# Patient Record
Sex: Female | Born: 1940
Health system: Southern US, Community
[De-identification: ages and names within clinical notes are randomized; demographics above are authoritative.]

## PROBLEM LIST (undated history)

## (undated) DIAGNOSIS — E785 Hyperlipidemia, unspecified: Secondary | ICD-10-CM

## (undated) DIAGNOSIS — F419 Anxiety disorder, unspecified: Secondary | ICD-10-CM

## (undated) DIAGNOSIS — F32A Depression, unspecified: Secondary | ICD-10-CM

## (undated) DIAGNOSIS — M549 Dorsalgia, unspecified: Secondary | ICD-10-CM

## (undated) DIAGNOSIS — E7800 Pure hypercholesterolemia, unspecified: Secondary | ICD-10-CM

## (undated) DIAGNOSIS — K635 Polyp of colon: Secondary | ICD-10-CM

## (undated) DIAGNOSIS — Z87440 Personal history of urinary (tract) infections: Secondary | ICD-10-CM

## (undated) DIAGNOSIS — M79605 Pain in left leg: Secondary | ICD-10-CM

## (undated) DIAGNOSIS — R739 Hyperglycemia, unspecified: Secondary | ICD-10-CM

## (undated) DIAGNOSIS — K219 Gastro-esophageal reflux disease without esophagitis: Secondary | ICD-10-CM

## (undated) DIAGNOSIS — M1990 Unspecified osteoarthritis, unspecified site: Secondary | ICD-10-CM

## (undated) DIAGNOSIS — F329 Major depressive disorder, single episode, unspecified: Secondary | ICD-10-CM

## (undated) DIAGNOSIS — I10 Essential (primary) hypertension: Secondary | ICD-10-CM

## (undated) DIAGNOSIS — G8929 Other chronic pain: Secondary | ICD-10-CM

## (undated) DIAGNOSIS — G500 Trigeminal neuralgia: Secondary | ICD-10-CM

## (undated) DIAGNOSIS — R635 Abnormal weight gain: Secondary | ICD-10-CM

## (undated) DIAGNOSIS — E871 Hypo-osmolality and hyponatremia: Secondary | ICD-10-CM

## (undated) HISTORY — PX: ABDOMINAL EXPLORATION SURGERY: SHX538

## (undated) HISTORY — DX: Hyperlipidemia, unspecified: E78.5

## (undated) HISTORY — DX: Trigeminal neuralgia: G50.0

## (undated) HISTORY — DX: Depression, unspecified: F32.A

## (undated) HISTORY — PX: APPENDECTOMY: SHX54

## (undated) HISTORY — DX: Hyperglycemia, unspecified: R73.9

## (undated) HISTORY — DX: Pain in left leg: M79.605

## (undated) HISTORY — DX: Hypo-osmolality and hyponatremia: E87.1

## (undated) HISTORY — DX: Anxiety disorder, unspecified: F41.9

## (undated) HISTORY — PX: TONSILLECTOMY: SUR1361

## (undated) HISTORY — PX: COLONOSCOPY: SHX174

## (undated) HISTORY — DX: Polyp of colon: K63.5

## (undated) HISTORY — DX: Abnormal weight gain: R63.5

---
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
## 1998-12-15 ENCOUNTER — Emergency Department (HOSPITAL_COMMUNITY): Admission: EM | Admit: 1998-12-15 | Discharge: 1998-12-15 | Payer: Self-pay | Admitting: Emergency Medicine

## 2000-10-13 ENCOUNTER — Other Ambulatory Visit: Admission: RE | Admit: 2000-10-13 | Discharge: 2000-10-13 | Payer: Self-pay | Admitting: Obstetrics and Gynecology

## 2001-10-26 ENCOUNTER — Other Ambulatory Visit: Admission: RE | Admit: 2001-10-26 | Discharge: 2001-10-26 | Payer: Self-pay | Admitting: Internal Medicine

## 2011-02-21 ENCOUNTER — Other Ambulatory Visit: Payer: Self-pay | Admitting: Neurology

## 2011-02-21 DIAGNOSIS — G501 Atypical facial pain: Secondary | ICD-10-CM

## 2011-02-28 ENCOUNTER — Ambulatory Visit
Admission: RE | Admit: 2011-02-28 | Discharge: 2011-02-28 | Disposition: A | Discharge status: Home / Self Care | Payer: Medicare Other | Source: Ambulatory Visit | Attending: Neurology | Admitting: Neurology

## 2011-02-28 DIAGNOSIS — G501 Atypical facial pain: Secondary | ICD-10-CM

## 2011-02-28 MED ORDER — GADOBENATE DIMEGLUMINE 529 MG/ML IV SOLN
15.0000 mL | Freq: Once | INTRAVENOUS | Status: AC | PRN
  Administered 2011-02-28: 15 mL via INTRAVENOUS

## 2011-11-20 DIAGNOSIS — M545 Low back pain: Secondary | ICD-10-CM | POA: Diagnosis not present

## 2011-11-20 DIAGNOSIS — R030 Elevated blood-pressure reading, without diagnosis of hypertension: Secondary | ICD-10-CM | POA: Diagnosis not present

## 2011-11-21 DIAGNOSIS — M5126 Other intervertebral disc displacement, lumbar region: Secondary | ICD-10-CM | POA: Diagnosis not present

## 2012-01-13 DIAGNOSIS — Z0000 Encounter for general adult medical examination without abnormal findings: Secondary | ICD-10-CM | POA: Diagnosis not present

## 2012-01-13 DIAGNOSIS — Z124 Encounter for screening for malignant neoplasm of cervix: Secondary | ICD-10-CM | POA: Diagnosis not present

## 2012-01-13 DIAGNOSIS — Z01419 Encounter for gynecological examination (general) (routine) without abnormal findings: Secondary | ICD-10-CM | POA: Diagnosis not present

## 2012-04-08 DIAGNOSIS — I10 Essential (primary) hypertension: Secondary | ICD-10-CM | POA: Diagnosis not present

## 2012-04-08 DIAGNOSIS — Z79899 Other long term (current) drug therapy: Secondary | ICD-10-CM | POA: Diagnosis not present

## 2012-04-08 DIAGNOSIS — M545 Low back pain: Secondary | ICD-10-CM | POA: Diagnosis not present

## 2012-04-08 DIAGNOSIS — G500 Trigeminal neuralgia: Secondary | ICD-10-CM | POA: Diagnosis not present

## 2012-05-17 ENCOUNTER — Other Ambulatory Visit: Payer: Self-pay | Admitting: Dermatology

## 2012-05-17 DIAGNOSIS — K648 Other hemorrhoids: Secondary | ICD-10-CM | POA: Diagnosis not present

## 2012-05-17 DIAGNOSIS — D126 Benign neoplasm of colon, unspecified: Secondary | ICD-10-CM | POA: Diagnosis not present

## 2012-05-17 DIAGNOSIS — Z86010 Personal history of colonic polyps: Secondary | ICD-10-CM | POA: Diagnosis not present

## 2012-05-17 DIAGNOSIS — M545 Low back pain: Secondary | ICD-10-CM

## 2012-05-18 ENCOUNTER — Ambulatory Visit
Admission: RE | Admit: 2012-05-18 | Discharge: 2012-05-18 | Disposition: A | Discharge status: Home / Self Care | Payer: Medicare Other | Source: Ambulatory Visit | Attending: Dermatology | Admitting: Dermatology

## 2012-05-18 VITALS — BP 166/76 | HR 74 | Ht 67.0 in | Wt 150.0 lb

## 2012-05-18 DIAGNOSIS — M4310 Spondylolisthesis, site unspecified: Secondary | ICD-10-CM | POA: Diagnosis not present

## 2012-05-18 DIAGNOSIS — M545 Low back pain: Secondary | ICD-10-CM

## 2012-05-18 DIAGNOSIS — M47817 Spondylosis without myelopathy or radiculopathy, lumbosacral region: Secondary | ICD-10-CM | POA: Diagnosis not present

## 2012-05-18 MED ORDER — IOHEXOL 180 MG/ML  SOLN
1.0000 mL | Freq: Once | INTRAMUSCULAR | Status: AC | PRN
  Administered 2012-05-18: 1 mL via EPIDURAL

## 2012-05-18 MED ORDER — METHYLPREDNISOLONE ACETATE 40 MG/ML INJ SUSP (RADIOLOG
120.0000 mg | Freq: Once | INTRAMUSCULAR | Status: AC
  Administered 2012-05-18: 120 mg via EPIDURAL

## 2012-05-27 ENCOUNTER — Other Ambulatory Visit: Payer: Self-pay | Admitting: Internal Medicine

## 2012-05-27 DIAGNOSIS — M545 Low back pain: Secondary | ICD-10-CM

## 2012-06-02 DIAGNOSIS — K648 Other hemorrhoids: Secondary | ICD-10-CM | POA: Diagnosis not present

## 2012-06-15 ENCOUNTER — Ambulatory Visit
Admission: RE | Admit: 2012-06-15 | Discharge: 2012-06-15 | Disposition: A | Discharge status: Home / Self Care | Payer: Medicare Other | Source: Ambulatory Visit | Attending: Internal Medicine | Admitting: Internal Medicine

## 2012-06-15 VITALS — BP 159/66 | HR 77

## 2012-06-15 DIAGNOSIS — M545 Low back pain: Secondary | ICD-10-CM

## 2012-06-15 DIAGNOSIS — M47817 Spondylosis without myelopathy or radiculopathy, lumbosacral region: Secondary | ICD-10-CM | POA: Diagnosis not present

## 2012-06-15 DIAGNOSIS — M5126 Other intervertebral disc displacement, lumbar region: Secondary | ICD-10-CM

## 2012-06-15 MED ORDER — METHYLPREDNISOLONE ACETATE 40 MG/ML INJ SUSP (RADIOLOG
120.0000 mg | Freq: Once | INTRAMUSCULAR | Status: AC
  Administered 2012-06-15: 120 mg via EPIDURAL

## 2012-06-15 MED ORDER — IOHEXOL 180 MG/ML  SOLN
1.0000 mL | Freq: Once | INTRAMUSCULAR | Status: AC | PRN
  Administered 2012-06-15: 1 mL via EPIDURAL

## 2012-06-16 DIAGNOSIS — K648 Other hemorrhoids: Secondary | ICD-10-CM | POA: Diagnosis not present

## 2012-06-30 DIAGNOSIS — K648 Other hemorrhoids: Secondary | ICD-10-CM | POA: Diagnosis not present

## 2012-08-12 DIAGNOSIS — Z23 Encounter for immunization: Secondary | ICD-10-CM | POA: Diagnosis not present

## 2012-10-04 DIAGNOSIS — E785 Hyperlipidemia, unspecified: Secondary | ICD-10-CM | POA: Diagnosis not present

## 2012-10-04 DIAGNOSIS — I10 Essential (primary) hypertension: Secondary | ICD-10-CM | POA: Diagnosis not present

## 2012-10-11 DIAGNOSIS — Z79899 Other long term (current) drug therapy: Secondary | ICD-10-CM | POA: Diagnosis not present

## 2012-10-11 DIAGNOSIS — Z0000 Encounter for general adult medical examination without abnormal findings: Secondary | ICD-10-CM | POA: Diagnosis not present

## 2012-10-11 DIAGNOSIS — I10 Essential (primary) hypertension: Secondary | ICD-10-CM | POA: Diagnosis not present

## 2012-10-11 DIAGNOSIS — E785 Hyperlipidemia, unspecified: Secondary | ICD-10-CM | POA: Diagnosis not present

## 2012-10-18 ENCOUNTER — Ambulatory Visit
Admission: RE | Admit: 2012-10-18 | Discharge: 2012-10-18 | Disposition: A | Discharge status: Home / Self Care | Payer: Medicare Other | Source: Ambulatory Visit | Attending: Internal Medicine | Admitting: Internal Medicine

## 2012-10-18 ENCOUNTER — Other Ambulatory Visit: Payer: Self-pay | Admitting: Internal Medicine

## 2012-10-18 DIAGNOSIS — IMO0002 Reserved for concepts with insufficient information to code with codable children: Secondary | ICD-10-CM | POA: Diagnosis not present

## 2012-10-18 DIAGNOSIS — M545 Low back pain: Secondary | ICD-10-CM

## 2012-10-18 DIAGNOSIS — M5126 Other intervertebral disc displacement, lumbar region: Secondary | ICD-10-CM | POA: Diagnosis not present

## 2012-10-18 MED ORDER — IOHEXOL 180 MG/ML  SOLN
1.0000 mL | Freq: Once | INTRAMUSCULAR | Status: AC | PRN
  Administered 2012-10-18: 1 mL via EPIDURAL

## 2012-10-18 MED ORDER — METHYLPREDNISOLONE ACETATE 40 MG/ML INJ SUSP (RADIOLOG
120.0000 mg | Freq: Once | INTRAMUSCULAR | Status: AC
  Administered 2012-10-18: 120 mg via EPIDURAL

## 2013-01-12 ENCOUNTER — Other Ambulatory Visit: Payer: Self-pay | Admitting: Internal Medicine

## 2013-01-13 ENCOUNTER — Ambulatory Visit
Admission: RE | Admit: 2013-01-13 | Discharge: 2013-01-13 | Disposition: A | Discharge status: Home / Self Care | Payer: Medicare Other | Source: Ambulatory Visit | Attending: Internal Medicine | Admitting: Internal Medicine

## 2013-01-13 VITALS — BP 150/75 | HR 81

## 2013-01-13 DIAGNOSIS — M4310 Spondylolisthesis, site unspecified: Secondary | ICD-10-CM | POA: Diagnosis not present

## 2013-01-13 MED ORDER — METHYLPREDNISOLONE ACETATE 40 MG/ML INJ SUSP (RADIOLOG
120.0000 mg | Freq: Once | INTRAMUSCULAR | Status: AC
  Administered 2013-01-13: 120 mg via EPIDURAL

## 2013-01-13 MED ORDER — IOHEXOL 180 MG/ML  SOLN
1.0000 mL | Freq: Once | INTRAMUSCULAR | Status: AC | PRN
  Administered 2013-01-13: 1 mL via EPIDURAL

## 2013-03-18 ENCOUNTER — Other Ambulatory Visit: Payer: Self-pay | Admitting: Internal Medicine

## 2013-03-18 DIAGNOSIS — M549 Dorsalgia, unspecified: Secondary | ICD-10-CM

## 2013-03-21 ENCOUNTER — Ambulatory Visit
Admission: RE | Admit: 2013-03-21 | Discharge: 2013-03-21 | Disposition: A | Discharge status: Home / Self Care | Payer: Medicare Other | Source: Ambulatory Visit | Attending: Internal Medicine | Admitting: Internal Medicine

## 2013-03-21 ENCOUNTER — Other Ambulatory Visit: Payer: Self-pay | Admitting: Internal Medicine

## 2013-03-21 VITALS — BP 151/70 | HR 75

## 2013-03-21 DIAGNOSIS — M549 Dorsalgia, unspecified: Secondary | ICD-10-CM

## 2013-03-21 DIAGNOSIS — M47817 Spondylosis without myelopathy or radiculopathy, lumbosacral region: Secondary | ICD-10-CM | POA: Diagnosis not present

## 2013-03-21 MED ORDER — IOHEXOL 180 MG/ML  SOLN
1.0000 mL | Freq: Once | INTRAMUSCULAR | Status: AC | PRN
  Administered 2013-03-21: 1 mL via EPIDURAL

## 2013-03-21 MED ORDER — METHYLPREDNISOLONE ACETATE 40 MG/ML INJ SUSP (RADIOLOG
120.0000 mg | Freq: Once | INTRAMUSCULAR | Status: AC
  Administered 2013-03-21: 120 mg via EPIDURAL

## 2013-03-21 NOTE — Progress Notes (Signed)
Cold pack to injection sites  

## 2013-08-17 ENCOUNTER — Other Ambulatory Visit: Payer: Self-pay | Admitting: Internal Medicine

## 2013-08-17 ENCOUNTER — Other Ambulatory Visit: Payer: Self-pay | Admitting: *Deleted

## 2013-08-17 DIAGNOSIS — M545 Low back pain: Secondary | ICD-10-CM

## 2013-08-22 ENCOUNTER — Other Ambulatory Visit: Payer: Self-pay | Admitting: Internal Medicine

## 2013-08-22 ENCOUNTER — Ambulatory Visit
Admission: RE | Admit: 2013-08-22 | Discharge: 2013-08-22 | Disposition: A | Discharge status: Home / Self Care | Payer: Medicare Other | Source: Ambulatory Visit | Attending: Internal Medicine | Admitting: Internal Medicine

## 2013-08-22 VITALS — BP 167/85 | HR 81

## 2013-08-22 DIAGNOSIS — M545 Low back pain: Secondary | ICD-10-CM

## 2013-08-22 DIAGNOSIS — M47817 Spondylosis without myelopathy or radiculopathy, lumbosacral region: Secondary | ICD-10-CM | POA: Diagnosis not present

## 2013-08-22 MED ORDER — IOHEXOL 180 MG/ML  SOLN
1.0000 mL | Freq: Once | INTRAMUSCULAR | Status: AC | PRN
  Administered 2013-08-22: 1 mL via EPIDURAL

## 2013-08-22 MED ORDER — METHYLPREDNISOLONE ACETATE 40 MG/ML INJ SUSP (RADIOLOG
120.0000 mg | Freq: Once | INTRAMUSCULAR | Status: AC
  Administered 2013-08-22: 120 mg via EPIDURAL

## 2013-09-12 DIAGNOSIS — Z23 Encounter for immunization: Secondary | ICD-10-CM | POA: Diagnosis not present

## 2013-10-13 DIAGNOSIS — R7309 Other abnormal glucose: Secondary | ICD-10-CM | POA: Diagnosis not present

## 2013-10-13 DIAGNOSIS — E785 Hyperlipidemia, unspecified: Secondary | ICD-10-CM | POA: Diagnosis not present

## 2013-10-13 DIAGNOSIS — I10 Essential (primary) hypertension: Secondary | ICD-10-CM | POA: Diagnosis not present

## 2013-10-20 DIAGNOSIS — I10 Essential (primary) hypertension: Secondary | ICD-10-CM | POA: Diagnosis not present

## 2013-10-20 DIAGNOSIS — F341 Dysthymic disorder: Secondary | ICD-10-CM | POA: Diagnosis not present

## 2013-10-20 DIAGNOSIS — E785 Hyperlipidemia, unspecified: Secondary | ICD-10-CM | POA: Diagnosis not present

## 2013-10-20 DIAGNOSIS — R7309 Other abnormal glucose: Secondary | ICD-10-CM | POA: Diagnosis not present

## 2013-10-20 DIAGNOSIS — Z0000 Encounter for general adult medical examination without abnormal findings: Secondary | ICD-10-CM | POA: Diagnosis not present

## 2013-10-20 DIAGNOSIS — Z1331 Encounter for screening for depression: Secondary | ICD-10-CM | POA: Diagnosis not present

## 2013-10-20 DIAGNOSIS — M545 Low back pain: Secondary | ICD-10-CM | POA: Diagnosis not present

## 2013-10-20 DIAGNOSIS — R0789 Other chest pain: Secondary | ICD-10-CM | POA: Diagnosis not present

## 2013-10-20 DIAGNOSIS — Z79899 Other long term (current) drug therapy: Secondary | ICD-10-CM | POA: Diagnosis not present

## 2013-10-21 ENCOUNTER — Other Ambulatory Visit: Payer: Self-pay | Admitting: Internal Medicine

## 2013-10-21 DIAGNOSIS — M549 Dorsalgia, unspecified: Secondary | ICD-10-CM

## 2013-10-24 ENCOUNTER — Other Ambulatory Visit: Payer: Self-pay | Admitting: Internal Medicine

## 2013-10-24 ENCOUNTER — Ambulatory Visit
Admission: RE | Admit: 2013-10-24 | Discharge: 2013-10-24 | Disposition: A | Discharge status: Home / Self Care | Payer: BC Managed Care – PPO | Source: Ambulatory Visit | Attending: Internal Medicine | Admitting: Internal Medicine

## 2013-10-24 DIAGNOSIS — M549 Dorsalgia, unspecified: Secondary | ICD-10-CM

## 2013-10-24 DIAGNOSIS — M47817 Spondylosis without myelopathy or radiculopathy, lumbosacral region: Secondary | ICD-10-CM | POA: Diagnosis not present

## 2013-10-24 MED ORDER — IOHEXOL 180 MG/ML  SOLN: 1.0000 mL | Freq: Once | INTRAMUSCULAR | Status: AC | PRN

## 2013-10-24 MED ORDER — METHYLPREDNISOLONE ACETATE 40 MG/ML INJ SUSP (RADIOLOG
120.0000 mg | Freq: Once | INTRAMUSCULAR | Status: AC
  Administered 2013-10-24: 120 mg via INTRA_ARTICULAR

## 2013-10-24 MED ORDER — METHYLPREDNISOLONE ACETATE 40 MG/ML INJ SUSP (RADIOLOG: 120.0000 mg | Freq: Once | INTRAMUSCULAR | Status: DC

## 2013-10-24 MED ORDER — IOHEXOL 180 MG/ML  SOLN
1.0000 mL | Freq: Once | INTRAMUSCULAR | Status: AC | PRN
  Administered 2013-10-24: 1 mL via INTRA_ARTICULAR

## 2013-11-21 DIAGNOSIS — M549 Dorsalgia, unspecified: Secondary | ICD-10-CM | POA: Diagnosis not present

## 2013-11-21 DIAGNOSIS — E669 Obesity, unspecified: Secondary | ICD-10-CM | POA: Diagnosis not present

## 2013-11-22 ENCOUNTER — Other Ambulatory Visit: Payer: Self-pay | Admitting: Neurological Surgery

## 2013-11-22 DIAGNOSIS — M549 Dorsalgia, unspecified: Secondary | ICD-10-CM

## 2013-11-24 ENCOUNTER — Ambulatory Visit
Admission: RE | Admit: 2013-11-24 | Discharge: 2013-11-24 | Disposition: A | Discharge status: Home / Self Care | Payer: Medicare Other | Source: Ambulatory Visit | Attending: Neurological Surgery | Admitting: Neurological Surgery

## 2013-11-24 DIAGNOSIS — M4310 Spondylolisthesis, site unspecified: Secondary | ICD-10-CM | POA: Diagnosis not present

## 2013-11-24 DIAGNOSIS — M5137 Other intervertebral disc degeneration, lumbosacral region: Secondary | ICD-10-CM | POA: Diagnosis not present

## 2013-11-24 DIAGNOSIS — M5126 Other intervertebral disc displacement, lumbar region: Secondary | ICD-10-CM | POA: Diagnosis not present

## 2013-11-24 DIAGNOSIS — M549 Dorsalgia, unspecified: Secondary | ICD-10-CM

## 2013-11-24 DIAGNOSIS — M48061 Spinal stenosis, lumbar region without neurogenic claudication: Secondary | ICD-10-CM | POA: Diagnosis not present

## 2013-12-12 ENCOUNTER — Other Ambulatory Visit: Payer: Self-pay | Admitting: Neurological Surgery

## 2013-12-21 ENCOUNTER — Encounter (HOSPITAL_COMMUNITY): Payer: Self-pay | Admitting: Pharmacy Technician

## 2013-12-22 ENCOUNTER — Other Ambulatory Visit (HOSPITAL_COMMUNITY): Payer: Self-pay | Admitting: *Deleted

## 2013-12-22 ENCOUNTER — Encounter (HOSPITAL_COMMUNITY)
Admission: RE | Admit: 2013-12-22 | Discharge: 2013-12-22 | Disposition: A | Discharge status: Home / Self Care | Payer: Medicare Other | Source: Ambulatory Visit | Attending: Neurological Surgery | Admitting: Neurological Surgery

## 2013-12-22 ENCOUNTER — Encounter (HOSPITAL_COMMUNITY): Payer: Self-pay

## 2013-12-22 DIAGNOSIS — Z01810 Encounter for preprocedural cardiovascular examination: Secondary | ICD-10-CM | POA: Insufficient documentation

## 2013-12-22 DIAGNOSIS — Z01812 Encounter for preprocedural laboratory examination: Secondary | ICD-10-CM | POA: Insufficient documentation

## 2013-12-22 DIAGNOSIS — Z01818 Encounter for other preprocedural examination: Secondary | ICD-10-CM | POA: Diagnosis not present

## 2013-12-22 HISTORY — DX: Gastro-esophageal reflux disease without esophagitis: K21.9

## 2013-12-22 HISTORY — DX: Dorsalgia, unspecified: M54.9

## 2013-12-22 HISTORY — DX: Essential (primary) hypertension: I10

## 2013-12-22 HISTORY — DX: Pure hypercholesterolemia, unspecified: E78.00

## 2013-12-22 HISTORY — DX: Personal history of urinary (tract) infections: Z87.440

## 2013-12-22 HISTORY — DX: Other chronic pain: G89.29

## 2013-12-22 HISTORY — DX: Unspecified osteoarthritis, unspecified site: M19.90

## 2013-12-22 LAB — CBC WITH DIFFERENTIAL/PLATELET
BASOS ABS: 0.1 10*3/uL (ref 0.0–0.1)
BASOS PCT: 1 % (ref 0–1)
EOS ABS: 0.1 10*3/uL (ref 0.0–0.7)
EOS PCT: 1 % (ref 0–5)
HCT: 37.3 % (ref 36.0–46.0)
Hemoglobin: 12.7 g/dL (ref 12.0–15.0)
Lymphocytes Relative: 26 % (ref 12–46)
Lymphs Abs: 1.8 10*3/uL (ref 0.7–4.0)
MCH: 34.6 pg — AB (ref 26.0–34.0)
MCHC: 34 g/dL (ref 30.0–36.0)
MCV: 101.6 fL — ABNORMAL HIGH (ref 78.0–100.0)
Monocytes Absolute: 0.6 10*3/uL (ref 0.1–1.0)
Monocytes Relative: 8 % (ref 3–12)
Neutro Abs: 4.5 10*3/uL (ref 1.7–7.7)
Neutrophils Relative %: 64 % (ref 43–77)
PLATELETS: 226 10*3/uL (ref 150–400)
RBC: 3.67 MIL/uL — AB (ref 3.87–5.11)
RDW: 13.9 % (ref 11.5–15.5)
WBC: 7.1 10*3/uL (ref 4.0–10.5)

## 2013-12-22 LAB — BASIC METABOLIC PANEL
BUN: 15 mg/dL (ref 6–23)
CALCIUM: 8.8 mg/dL (ref 8.4–10.5)
CO2: 24 mEq/L (ref 19–32)
Chloride: 102 mEq/L (ref 96–112)
Creatinine, Ser: 0.73 mg/dL (ref 0.50–1.10)
GFR calc Af Amer: >90 mL/min (ref >90)
GFR calc non Af Amer: 84 mL/min — ABNORMAL LOW (ref >90)
GLUCOSE: 107 mg/dL — AB (ref 70–99)
Potassium: 4.3 mEq/L (ref 3.7–5.3)
Sodium: 141 mEq/L (ref 137–147)

## 2013-12-22 LAB — TYPE AND SCREEN
ABO/RH(D): A POS
Antibody Screen: NEGATIVE

## 2013-12-22 LAB — PROTIME-INR
INR: 1.05 (ref 0.00–1.49)
PROTHROMBIN TIME: 13.5 s (ref 11.6–15.2)

## 2013-12-22 LAB — ABO/RH: ABO/RH(D): A POS

## 2013-12-22 LAB — SURGICAL PCR SCREEN
MRSA, PCR: NEGATIVE
Staphylococcus aureus: NEGATIVE

## 2013-12-22 NOTE — Pre-Procedure Instructions (Signed)
Janet Boyer  12/22/2013   Your procedure is scheduled on:  Friday, December 30, 2013 at 7:30 AM.   Report to [facility] Entrance "A" Admitting Office at 5:30 AM.   Call this number if you have problems the morning of surgery: [phone]   Remember:   Do not eat food or drink liquids after midnight Thursday, 12/29/13.   Take these medicines the morning of surgery with A SIP OF WATER: diltiazem (MATZIM LA),  LORazepam (ATIVAN), omeprazole (PRILOSEC), SALINE nasal spray - if needed.    Do not wear jewelry, make-up or nail polish.  Do not wear lotions, powders, or perfumes. You may wear deodorant.  Do not shave 48 hours prior to surgery.   Do not bring valuables to the hospital.  [facility] is not responsible                  for any belongings or valuables.               Contacts, dentures or bridgework may not be worn into surgery.  Leave suitcase in the car. After surgery it may be brought to your room.  For patients admitted to the hospital, discharge time is determined by your                treatment team.              Special Instructions: [place] - Preparing for Surgery  Before surgery, you can play an important role.  Because skin is not sterile, your skin needs to be as free of germs as possible.  You can reduce the number of germs on you skin by washing with CHG (chlorahexidine gluconate) soap before surgery.  CHG is an antiseptic cleaner which kills germs and bonds with the skin to continue killing germs even after washing.  Please DO NOT use if you have an allergy to CHG or antibacterial soaps.  If your skin becomes reddened/irritated stop using the CHG and inform your nurse when you arrive at Short Stay.  Do not shave (including legs and underarms) for at least 48 hours prior to the first CHG shower.  You may shave your face.  Please follow these instructions carefully:   1.  Shower with CHG Soap the night before surgery and the                                 morning of Surgery.  2.  If you choose to wash your hair, wash your hair first as usual with your       normal shampoo.  3.  After you shampoo, rinse your hair and body thoroughly to remove the                      Shampoo.  4.  Use CHG as you would any other liquid soap.  You can apply chg directly       to the skin and wash gently with scrungie or a clean washcloth.  5.  Apply the CHG Soap to your body ONLY FROM THE NECK DOWN.        Do not use on open wounds or open sores.  Avoid contact with your eyes, ears, mouth and genitals (private parts).  Wash genitals (private parts) with your normal soap.  6.  Wash thoroughly, paying special attention to the area where your surgery  will be performed.  7.  Thoroughly rinse your body with warm water from the neck down.  8.  DO NOT shower/wash with your normal soap after using and rinsing off       the CHG Soap.  9.  Pat yourself dry with a clean towel.            10.  Wear clean pajamas.            11.  Place clean sheets on your bed the night of your first shower and do not        sleep with pets.  Day of Surgery  Do not apply any lotions/deoderants the morning of surgery.  Please wear clean clothes to the hospital/surgery center.     Please read over the following fact sheets that you were given: Pain Booklet, Coughing and Deep Breathing, Blood Transfusion Information, MRSA Information and Surgical Site Infection Prevention

## 2013-12-22 NOTE — Pre-Procedure Instructions (Signed)
Janet Boyer  12/22/2013   Your procedure is scheduled on:  Friday, December 30, 2013 at 7:30 AM.   Report to [facility] Entrance "A" Admitting Office at 5:30 AM.   Call this number if you have problems the morning of surgery: [phone]   Remember:   Do not eat food or drink liquids after midnight Thursday, 12/29/13.   Take these medicines the morning of surgery with A SIP OF WATER: diltiazem (MATZIM LA),  LORazepam (ATIVAN), omeprazole (PRILOSEC), SALINE nasal spray - if needed.    Do not wear jewelry, make-up or nail polish.  Do not wear lotions, powders, or perfumes. You may wear deodorant.  Do not shave 48 hours prior to surgery.   Do not bring valuables to the hospital.  [facility] is not responsible                  for any belongings or valuables.               Contacts, dentures or bridgework may not be worn into surgery.  Leave suitcase in the car. After surgery it may be brought to your room.  For patients admitted to the hospital, discharge time is determined by your                treatment team.              Special Instructions: [place] - Preparing for Surgery  Before surgery, you can play an important role.  Because skin is not sterile, your skin needs to be as free of germs as possible.  You can reduce the number of germs on you skin by washing with CHG (chlorahexidine gluconate) soap before surgery.  CHG is an antiseptic cleaner which kills germs and bonds with the skin to continue killing germs even after washing.  Please DO NOT use if you have an allergy to CHG or antibacterial soaps.  If your skin becomes reddened/irritated stop using the CHG and inform your nurse when you arrive at Short Stay.  Do not shave (including legs and underarms) for at least 48 hours prior to the first CHG shower.  You may shave your face.  Please follow these instructions carefully:   1.  Shower with CHG Soap the night before surgery and the                                 morning of Surgery.  2.  If you choose to wash your hair, wash your hair first as usual with your       normal shampoo.  3.  After you shampoo, rinse your hair and body thoroughly to remove the                      Shampoo.  4.  Use CHG as you would any other liquid soap.  You can apply chg directly       to the skin and wash gently with scrungie or a clean washcloth.  5.  Apply the CHG Soap to your body ONLY FROM THE NECK DOWN.        Do not use on open wounds or open sores.  Avoid contact with your eyes, ears, mouth and genitals (private parts).  Wash genitals (private parts) with your normal soap.  6.  Wash thoroughly, paying special attention to the area where your surgery          thoroughly, paying special attention to the area where your surgery        will be performed.  7.  Thoroughly rinse your body with warm water from the neck down.  8.  DO NOT shower/wash with your normal soap after using and rinsing off       the CHG Soap.  9.  Pat yourself dry with a clean towel.            10.  Wear clean pajamas.            11.  Place clean sheets on your bed the night of your first shower and do not        sleep with pets.  Day of Surgery  Do not apply any lotions/deoderants the morning of surgery.  Please wear clean clothes to the hospital/surgery center.     Please read over the following fact sheets that you were given: Pain Booklet, Coughing and Deep Breathing, Blood Transfusion Information, MRSA Information and Surgical Site Infection Prevention

## 2013-12-26 NOTE — Progress Notes (Signed)
Anesthesia Chart Review:  Patient is a 73 year old female scheduled for L3-4 MAS, PLIF on 12/30/13 by Dr. Ronnald Ramp.  History includes non-smoker, HTN, GERD, hypercholesterolemia, arthritis, UTI, chronic back pain.  PCP is Dr. Leanna Battles.  His last office note and her EKG, CXR, and labs reviewed for her PAT visit. If no acute changes then I anticipate that she can proceed as planned.  George Hugh [facility] Short Stay Center/Anesthesiology Phone [phone] 12/26/2013 1:25 PM

## 2013-12-29 MED ORDER — CEFAZOLIN SODIUM-DEXTROSE 2-3 GM-% IV SOLR
2.0000 g | INTRAVENOUS | Status: AC
  Administered 2013-12-30: 2 g via INTRAVENOUS
  Filled 2013-12-29: qty 50

## 2013-12-30 ENCOUNTER — Encounter (HOSPITAL_COMMUNITY): Payer: Medicare Other | Admitting: Vascular Surgery

## 2013-12-30 ENCOUNTER — Inpatient Hospital Stay (HOSPITAL_COMMUNITY): Payer: Medicare Other

## 2013-12-30 ENCOUNTER — Encounter (HOSPITAL_COMMUNITY): Payer: Self-pay | Admitting: Certified Registered"

## 2013-12-30 ENCOUNTER — Encounter (HOSPITAL_COMMUNITY)
Admission: RE | Disposition: A | Discharge status: Home Health | Payer: Self-pay | Source: Ambulatory Visit | Attending: Neurological Surgery

## 2013-12-30 ENCOUNTER — Inpatient Hospital Stay (HOSPITAL_COMMUNITY): Payer: Medicare Other | Admitting: Certified Registered"

## 2013-12-30 ENCOUNTER — Inpatient Hospital Stay (HOSPITAL_COMMUNITY)
Admission: RE | Admit: 2013-12-30 | Discharge: 2014-01-01 | DRG: 460 | Disposition: A | Discharge status: Home Health | Payer: Medicare Other | Source: Ambulatory Visit | Attending: Neurological Surgery | Admitting: Neurological Surgery

## 2013-12-30 DIAGNOSIS — Z01818 Encounter for other preprocedural examination: Secondary | ICD-10-CM

## 2013-12-30 DIAGNOSIS — I10 Essential (primary) hypertension: Secondary | ICD-10-CM | POA: Diagnosis present

## 2013-12-30 DIAGNOSIS — M4310 Spondylolisthesis, site unspecified: Secondary | ICD-10-CM | POA: Diagnosis not present

## 2013-12-30 DIAGNOSIS — M48061 Spinal stenosis, lumbar region without neurogenic claudication: Principal | ICD-10-CM | POA: Diagnosis present

## 2013-12-30 DIAGNOSIS — M519 Unspecified thoracic, thoracolumbar and lumbosacral intervertebral disc disorder: Secondary | ICD-10-CM | POA: Diagnosis not present

## 2013-12-30 DIAGNOSIS — G8929 Other chronic pain: Secondary | ICD-10-CM | POA: Diagnosis present

## 2013-12-30 DIAGNOSIS — Z01812 Encounter for preprocedural laboratory examination: Secondary | ICD-10-CM | POA: Diagnosis not present

## 2013-12-30 DIAGNOSIS — Z981 Arthrodesis status: Secondary | ICD-10-CM

## 2013-12-30 DIAGNOSIS — Z01810 Encounter for preprocedural cardiovascular examination: Secondary | ICD-10-CM

## 2013-12-30 DIAGNOSIS — M129 Arthropathy, unspecified: Secondary | ICD-10-CM | POA: Diagnosis present

## 2013-12-30 DIAGNOSIS — Z79899 Other long term (current) drug therapy: Secondary | ICD-10-CM

## 2013-12-30 DIAGNOSIS — K219 Gastro-esophageal reflux disease without esophagitis: Secondary | ICD-10-CM | POA: Diagnosis present

## 2013-12-30 DIAGNOSIS — M48062 Spinal stenosis, lumbar region with neurogenic claudication: Secondary | ICD-10-CM | POA: Diagnosis not present

## 2013-12-30 DIAGNOSIS — Q762 Congenital spondylolisthesis: Secondary | ICD-10-CM

## 2013-12-30 DIAGNOSIS — E7800 Pure hypercholesterolemia, unspecified: Secondary | ICD-10-CM | POA: Diagnosis not present

## 2013-12-30 DIAGNOSIS — M539 Dorsopathy, unspecified: Secondary | ICD-10-CM | POA: Diagnosis not present

## 2013-12-30 HISTORY — PX: MAXIMUM ACCESS (MAS)POSTERIOR LUMBAR INTERBODY FUSION (PLIF) 1 LEVEL: SHX6368

## 2013-12-30 SURGERY — FOR MAXIMUM ACCESS (MAS) POSTERIOR LUMBAR INTERBODY FUSION (PLIF) 1 LEVEL
Anesthesia: General | Site: Back

## 2013-12-30 MED ORDER — MIDAZOLAM HCL 2 MG/2ML IJ SOLN
INTRAMUSCULAR | Status: AC
  Filled 2013-12-30: qty 2

## 2013-12-30 MED ORDER — DILTIAZEM HCL ER COATED BEADS 180 MG PO TB24
180.0000 mg | ORAL_TABLET | Freq: Every day | ORAL | Status: DC
  Administered 2013-12-31 – 2014-01-01 (×2): 180 mg via ORAL
  Filled 2013-12-30 (×2): qty 1

## 2013-12-30 MED ORDER — TRIAMTERENE-HCTZ 37.5-25 MG PO TABS
0.5000 | ORAL_TABLET | Freq: Every day | ORAL | Status: DC
  Administered 2013-12-31 – 2014-01-01 (×2): 0.5 via ORAL
  Filled 2013-12-30 (×2): qty 0.5

## 2013-12-30 MED ORDER — SODIUM CHLORIDE 0.9 % IJ SOLN: 3.0000 mL | INTRAMUSCULAR | Status: DC | PRN

## 2013-12-30 MED ORDER — 0.9 % SODIUM CHLORIDE (POUR BTL) OPTIME
TOPICAL | Status: DC | PRN
  Administered 2013-12-30: 1000 mL

## 2013-12-30 MED ORDER — SENNA 8.6 MG PO TABS
1.0000 | ORAL_TABLET | Freq: Two times a day (BID) | ORAL | Status: DC
  Administered 2013-12-30 – 2014-01-01 (×5): 8.6 mg via ORAL
  Filled 2013-12-30 (×7): qty 1

## 2013-12-30 MED ORDER — THROMBIN 20000 UNITS EX SOLR
CUTANEOUS | Status: DC | PRN
  Administered 2013-12-30: 08:00:00 via TOPICAL

## 2013-12-30 MED ORDER — CARBAMAZEPINE 200 MG PO TABS
100.0000 mg | ORAL_TABLET | Freq: Every day | ORAL | Status: DC
  Filled 2013-12-30: qty 0.5

## 2013-12-30 MED ORDER — OXYCODONE HCL 5 MG/5ML PO SOLN: 5.0000 mg | Freq: Once | ORAL | Status: DC | PRN

## 2013-12-30 MED ORDER — CARBAMAZEPINE 200 MG PO TABS
50.0000 mg | ORAL_TABLET | Freq: Two times a day (BID) | ORAL | Status: DC
  Administered 2013-12-30: 100 mg via ORAL

## 2013-12-30 MED ORDER — SUCCINYLCHOLINE CHLORIDE 20 MG/ML IJ SOLN
INTRAMUSCULAR | Status: DC | PRN
  Administered 2013-12-30: 120 mg via INTRAVENOUS

## 2013-12-30 MED ORDER — SODIUM CHLORIDE 0.9 % IJ SOLN
3.0000 mL | Freq: Two times a day (BID) | INTRAMUSCULAR | Status: DC
  Administered 2013-12-30 – 2013-12-31 (×3): 3 mL via INTRAVENOUS

## 2013-12-30 MED ORDER — METHOCARBAMOL 500 MG PO TABS
500.0000 mg | ORAL_TABLET | Freq: Four times a day (QID) | ORAL | Status: DC | PRN
  Administered 2013-12-30 – 2013-12-31 (×2): 500 mg via ORAL
  Filled 2013-12-30 (×2): qty 1

## 2013-12-30 MED ORDER — ACETAMINOPHEN 10 MG/ML IV SOLN
1000.0000 mg | Freq: Once | INTRAVENOUS | Status: AC
  Administered 2013-12-30: 1000 mg via INTRAVENOUS

## 2013-12-30 MED ORDER — LIDOCAINE HCL (CARDIAC) 20 MG/ML IV SOLN
INTRAVENOUS | Status: AC
  Filled 2013-12-30: qty 5

## 2013-12-30 MED ORDER — BUPIVACAINE HCL (PF) 0.25 % IJ SOLN
INTRAMUSCULAR | Status: DC | PRN
  Administered 2013-12-30: 6 mL

## 2013-12-30 MED ORDER — PHENOL 1.4 % MT LIQD: 1.0000 | OROMUCOSAL | Status: DC | PRN

## 2013-12-30 MED ORDER — METHOCARBAMOL 100 MG/ML IJ SOLN
500.0000 mg | Freq: Four times a day (QID) | INTRAVENOUS | Status: DC | PRN
  Filled 2013-12-30: qty 5

## 2013-12-30 MED ORDER — FENTANYL CITRATE 0.05 MG/ML IJ SOLN
INTRAMUSCULAR | Status: DC | PRN
  Administered 2013-12-30 (×2): 50 ug via INTRAVENOUS
  Administered 2013-12-30 (×3): 100 ug via INTRAVENOUS
  Administered 2013-12-30: 50 ug via INTRAVENOUS

## 2013-12-30 MED ORDER — PROPOFOL 10 MG/ML IV BOLUS
INTRAVENOUS | Status: AC
  Filled 2013-12-30: qty 20

## 2013-12-30 MED ORDER — SODIUM CHLORIDE 0.9 % IR SOLN
Status: DC | PRN
  Administered 2013-12-30: 09:00:00

## 2013-12-30 MED ORDER — PROMETHAZINE HCL 25 MG/ML IJ SOLN: 6.2500 mg | INTRAMUSCULAR | Status: DC | PRN

## 2013-12-30 MED ORDER — ONDANSETRON HCL 4 MG/2ML IJ SOLN
INTRAMUSCULAR | Status: DC | PRN
  Administered 2013-12-30: 4 mg via INTRAVENOUS

## 2013-12-30 MED ORDER — FENTANYL CITRATE 0.05 MG/ML IJ SOLN
INTRAMUSCULAR | Status: AC
  Filled 2013-12-30: qty 5

## 2013-12-30 MED ORDER — THROMBIN 5000 UNITS EX SOLR
OROMUCOSAL | Status: DC | PRN
  Administered 2013-12-30: 09:00:00 via TOPICAL

## 2013-12-30 MED ORDER — DEXAMETHASONE SODIUM PHOSPHATE 4 MG/ML IJ SOLN
4.0000 mg | Freq: Four times a day (QID) | INTRAMUSCULAR | Status: DC
  Filled 2013-12-30 (×8): qty 1

## 2013-12-30 MED ORDER — DEXAMETHASONE SODIUM PHOSPHATE 10 MG/ML IJ SOLN
INTRAMUSCULAR | Status: AC
  Filled 2013-12-30: qty 1

## 2013-12-30 MED ORDER — ROCURONIUM BROMIDE 50 MG/5ML IV SOLN
INTRAVENOUS | Status: AC
  Filled 2013-12-30: qty 1

## 2013-12-30 MED ORDER — CEFAZOLIN SODIUM 1-5 GM-% IV SOLN
1.0000 g | Freq: Three times a day (TID) | INTRAVENOUS | Status: AC
  Administered 2013-12-30 – 2013-12-31 (×2): 1 g via INTRAVENOUS
  Filled 2013-12-30 (×2): qty 50

## 2013-12-30 MED ORDER — LORAZEPAM 0.5 MG PO TABS
0.5000 mg | ORAL_TABLET | Freq: Every day | ORAL | Status: DC
  Administered 2013-12-30 – 2014-01-01 (×3): 0.5 mg via ORAL
  Filled 2013-12-30 (×3): qty 1

## 2013-12-30 MED ORDER — DEXAMETHASONE SODIUM PHOSPHATE 10 MG/ML IJ SOLN
10.0000 mg | INTRAMUSCULAR | Status: AC
  Administered 2013-12-30: 10 mg via INTRAVENOUS

## 2013-12-30 MED ORDER — OXYCODONE HCL 5 MG PO TABS: 5.0000 mg | ORAL_TABLET | Freq: Once | ORAL | Status: DC | PRN

## 2013-12-30 MED ORDER — ACETAMINOPHEN 10 MG/ML IV SOLN
INTRAVENOUS | Status: AC
  Filled 2013-12-30: qty 100

## 2013-12-30 MED ORDER — SODIUM CHLORIDE 0.9 % IV SOLN: 250.0000 mL | INTRAVENOUS | Status: DC

## 2013-12-30 MED ORDER — CARBAMAZEPINE 100 MG PO CHEW
50.0000 mg | CHEWABLE_TABLET | Freq: Every morning | ORAL | Status: DC
  Administered 2013-12-30: 50 mg via ORAL
  Filled 2013-12-30: qty 0.5

## 2013-12-30 MED ORDER — NEOSTIGMINE METHYLSULFATE 1 MG/ML IJ SOLN
INTRAMUSCULAR | Status: AC
  Filled 2013-12-30: qty 10

## 2013-12-30 MED ORDER — DEXAMETHASONE 4 MG PO TABS
4.0000 mg | ORAL_TABLET | Freq: Four times a day (QID) | ORAL | Status: DC
  Administered 2013-12-30 – 2014-01-01 (×9): 4 mg via ORAL
  Filled 2013-12-30 (×12): qty 1

## 2013-12-30 MED ORDER — ONDANSETRON HCL 4 MG/2ML IJ SOLN
INTRAMUSCULAR | Status: AC
  Filled 2013-12-30: qty 2

## 2013-12-30 MED ORDER — HYDROMORPHONE HCL PF 1 MG/ML IJ SOLN
0.2500 mg | INTRAMUSCULAR | Status: DC | PRN
  Administered 2013-12-30 (×2): 0.5 mg via INTRAVENOUS

## 2013-12-30 MED ORDER — ACETAMINOPHEN 650 MG RE SUPP: 650.0000 mg | RECTAL | Status: DC | PRN

## 2013-12-30 MED ORDER — LIDOCAINE HCL (CARDIAC) 20 MG/ML IV SOLN
INTRAVENOUS | Status: DC | PRN
  Administered 2013-12-30: 100 mg via INTRAVENOUS

## 2013-12-30 MED ORDER — LACTATED RINGERS IV SOLN
INTRAVENOUS | Status: DC | PRN
  Administered 2013-12-30 (×2): via INTRAVENOUS

## 2013-12-30 MED ORDER — HYDROMORPHONE HCL PF 1 MG/ML IJ SOLN
INTRAMUSCULAR | Status: AC
  Filled 2013-12-30: qty 1

## 2013-12-30 MED ORDER — CELECOXIB 200 MG PO CAPS
200.0000 mg | ORAL_CAPSULE | Freq: Two times a day (BID) | ORAL | Status: DC
  Administered 2013-12-30 – 2014-01-01 (×5): 200 mg via ORAL
  Filled 2013-12-30 (×6): qty 1

## 2013-12-30 MED ORDER — GLYCOPYRROLATE 0.2 MG/ML IJ SOLN
INTRAMUSCULAR | Status: AC
  Filled 2013-12-30: qty 3

## 2013-12-30 MED ORDER — MORPHINE SULFATE 2 MG/ML IJ SOLN: 1.0000 mg | INTRAMUSCULAR | Status: DC | PRN

## 2013-12-30 MED ORDER — PROPOFOL 10 MG/ML IV BOLUS
INTRAVENOUS | Status: DC | PRN
  Administered 2013-12-30: 150 mg via INTRAVENOUS

## 2013-12-30 MED ORDER — POTASSIUM CHLORIDE IN NACL 20-0.9 MEQ/L-% IV SOLN
INTRAVENOUS | Status: DC
  Administered 2013-12-30: 19:00:00 via INTRAVENOUS
  Filled 2013-12-30 (×5): qty 1000

## 2013-12-30 MED ORDER — SERTRALINE HCL 25 MG PO TABS
25.0000 mg | ORAL_TABLET | Freq: Every day | ORAL | Status: DC
  Administered 2013-12-30 – 2014-01-01 (×3): 25 mg via ORAL
  Filled 2013-12-30 (×3): qty 1

## 2013-12-30 MED ORDER — ACETAMINOPHEN 325 MG PO TABS: 650.0000 mg | ORAL_TABLET | ORAL | Status: DC | PRN

## 2013-12-30 MED ORDER — CARBAMAZEPINE 100 MG PO CHEW
50.0000 mg | CHEWABLE_TABLET | Freq: Every day | ORAL | Status: DC | PRN
  Filled 2013-12-30: qty 0.5

## 2013-12-30 MED ORDER — CARBAMAZEPINE 200 MG PO TABS
100.0000 mg | ORAL_TABLET | Freq: Every evening | ORAL | Status: DC | PRN
  Filled 2013-12-30: qty 0.5

## 2013-12-30 MED ORDER — OXYCODONE-ACETAMINOPHEN 5-325 MG PO TABS
1.0000 | ORAL_TABLET | ORAL | Status: DC | PRN
  Administered 2013-12-30 – 2014-01-01 (×9): 2 via ORAL
  Filled 2013-12-30 (×9): qty 2

## 2013-12-30 MED ORDER — ONDANSETRON HCL 4 MG/2ML IJ SOLN
4.0000 mg | INTRAMUSCULAR | Status: DC | PRN
Start: 2013-12-30 — End: 2014-01-01

## 2013-12-30 MED ORDER — MENTHOL 3 MG MT LOZG: 1.0000 | LOZENGE | OROMUCOSAL | Status: DC | PRN

## 2013-12-30 MED ORDER — MIDAZOLAM HCL 5 MG/5ML IJ SOLN
INTRAMUSCULAR | Status: DC | PRN
  Administered 2013-12-30: 2 mg via INTRAVENOUS

## 2013-12-30 MED ORDER — SODIUM CHLORIDE 0.9 % IV SOLN
10.0000 mg | INTRAVENOUS | Status: DC | PRN
  Administered 2013-12-30: 10 ug/min via INTRAVENOUS

## 2013-12-30 SURGICAL SUPPLY — 64 items
BAG DECANTER FOR FLEXI CONT (MISCELLANEOUS) ×3 IMPLANT
BENZOIN TINCTURE PRP APPL 2/3 (GAUZE/BANDAGES/DRESSINGS) ×3 IMPLANT
BLADE SURG ROTATE 9660 (MISCELLANEOUS) IMPLANT
BONE MATRIX OSTEOCEL PRO MED (Bone Implant) ×3 IMPLANT
BUR MATCHSTICK NEURO 3.0 LAGG (BURR) ×3 IMPLANT
CAGE COROENT LG 10X9X23-12 (Cage) ×6 IMPLANT
CANISTER SUCT 3000ML (MISCELLANEOUS) ×3 IMPLANT
CLIP NEUROVISION LG (CLIP) ×3 IMPLANT
CLOSURE WOUND 1/2 X4 (GAUZE/BANDAGES/DRESSINGS) ×2
CONT SPEC 4OZ CLIKSEAL STRL BL (MISCELLANEOUS) ×6 IMPLANT
COVER BACK TABLE 24X17X13 BIG (DRAPES) IMPLANT
COVER TABLE BACK 60X90 (DRAPES) ×3 IMPLANT
DRAPE C-ARM 42X72 X-RAY (DRAPES) ×3 IMPLANT
DRAPE C-ARMOR (DRAPES) ×3 IMPLANT
DRAPE LAPAROTOMY 100X72X124 (DRAPES) ×3 IMPLANT
DRAPE POUCH INSTRU U-SHP 10X18 (DRAPES) ×3 IMPLANT
DRAPE SURG 17X23 STRL (DRAPES) ×3 IMPLANT
DRESSING TELFA 8X3 (GAUZE/BANDAGES/DRESSINGS) ×3 IMPLANT
DRSG OPSITE 4X5.5 SM (GAUZE/BANDAGES/DRESSINGS) ×6 IMPLANT
DURAPREP 26ML APPLICATOR (WOUND CARE) ×3 IMPLANT
ELECT REM PT RETURN 9FT ADLT (ELECTROSURGICAL) ×3
ELECTRODE REM PT RTRN 9FT ADLT (ELECTROSURGICAL) ×1 IMPLANT
EVACUATOR 1/8 PVC DRAIN (DRAIN) ×3 IMPLANT
GAUZE SPONGE 4X4 16PLY XRAY LF (GAUZE/BANDAGES/DRESSINGS) IMPLANT
GLOVE BIO SURGEON STRL SZ8 (GLOVE) ×9 IMPLANT
GLOVE BIOGEL PI IND STRL 8 (GLOVE) ×1 IMPLANT
GLOVE BIOGEL PI IND STRL 8.5 (GLOVE) ×1 IMPLANT
GLOVE BIOGEL PI INDICATOR 8 (GLOVE) ×2
GLOVE BIOGEL PI INDICATOR 8.5 (GLOVE) ×2
GLOVE ECLIPSE 7.5 STRL STRAW (GLOVE) ×9 IMPLANT
GOWN BRE IMP SLV AUR LG STRL (GOWN DISPOSABLE) IMPLANT
GOWN BRE IMP SLV AUR XL STRL (GOWN DISPOSABLE) IMPLANT
GOWN STRL REIN 2XL LVL4 (GOWN DISPOSABLE) IMPLANT
GOWN STRL REUS W/ TWL XL LVL3 (GOWN DISPOSABLE) ×2 IMPLANT
GOWN STRL REUS W/TWL XL LVL3 (GOWN DISPOSABLE) ×4
HEMOSTAT POWDER KIT SURGIFOAM (HEMOSTASIS) ×3 IMPLANT
KIT BASIN OR (CUSTOM PROCEDURE TRAY) ×3 IMPLANT
KIT NEEDLE NVM5 EMG ELECT (KITS) ×1 IMPLANT
KIT NEEDLE NVM5 EMG ELECTRODE (KITS) ×2
KIT ROOM TURNOVER OR (KITS) ×3 IMPLANT
MILL MEDIUM DISP (BLADE) ×3 IMPLANT
NEEDLE HYPO 25X1 1.5 SAFETY (NEEDLE) ×3 IMPLANT
NS IRRIG 1000ML POUR BTL (IV SOLUTION) ×3 IMPLANT
PACK LAMINECTOMY NEURO (CUSTOM PROCEDURE TRAY) ×3 IMPLANT
PAD ARMBOARD 7.5X6 YLW CONV (MISCELLANEOUS) ×9 IMPLANT
ROD 35MM (Rod) ×6 IMPLANT
SCREW LOCK (Screw) ×8 IMPLANT
SCREW LOCK FXNS SPNE MAS PL (Screw) ×4 IMPLANT
SCREW SHANK 5.0X30MM (Screw) ×6 IMPLANT
SCREW SHANK PLIF 5.0X25 LUMBAR (Screw) ×6 IMPLANT
SCREW TULIP 5.5 (Screw) ×12 IMPLANT
SPONGE LAP 4X18 X RAY DECT (DISPOSABLE) IMPLANT
SPONGE SURGIFOAM ABS GEL 100 (HEMOSTASIS) ×3 IMPLANT
STRIP CLOSURE SKIN 1/2X4 (GAUZE/BANDAGES/DRESSINGS) ×4 IMPLANT
SUT VIC AB 0 CT1 18XCR BRD8 (SUTURE) ×1 IMPLANT
SUT VIC AB 0 CT1 8-18 (SUTURE) ×2
SUT VIC AB 2-0 CP2 18 (SUTURE) ×3 IMPLANT
SUT VIC AB 3-0 SH 8-18 (SUTURE) ×6 IMPLANT
SYR 20ML ECCENTRIC (SYRINGE) ×3 IMPLANT
SYR 3ML LL SCALE MARK (SYRINGE) IMPLANT
TOWEL OR 17X24 6PK STRL BLUE (TOWEL DISPOSABLE) ×3 IMPLANT
TOWEL OR 17X26 10 PK STRL BLUE (TOWEL DISPOSABLE) ×3 IMPLANT
TRAY FOLEY CATH 14FRSI W/METER (CATHETERS) ×3 IMPLANT
WATER STERILE IRR 1000ML POUR (IV SOLUTION) ×3 IMPLANT

## 2013-12-30 NOTE — Preoperative (Signed)
Beta Blockers   Reason not to administer Beta Blockers:Not Applicable 

## 2013-12-30 NOTE — Progress Notes (Signed)
Utilization review completed.  

## 2013-12-30 NOTE — Anesthesia Preprocedure Evaluation (Signed)
Anesthesia Evaluation  Patient identified by MRN, date of birth, ID band Patient awake    Reviewed: Allergy & Precautions, H&P , NPO status , Patient's Chart, lab work & pertinent test results  Airway Mallampati: II TM Distance: >3 FB Neck ROM: full    Dental  (+) Teeth Intact, Dental Advidsory Given   Pulmonary neg pulmonary ROS,  breath sounds clear to auscultation        Cardiovascular hypertension, On Medications Rhythm:regular Rate:Normal     Neuro/Psych negative neurological ROS  negative psych ROS   GI/Hepatic Neg liver ROS, GERD-  Medicated and Controlled,  Endo/Other  negative endocrine ROS  Renal/GU negative Renal ROS     Musculoskeletal   Abdominal   Peds  Hematology   Anesthesia Other Findings   Reproductive/Obstetrics negative OB ROS                           Anesthesia Physical Anesthesia Plan  ASA: III  Anesthesia Plan: General ETT   Post-op Pain Management:    Induction:   Airway Management Planned:   Additional Equipment:   Intra-op Plan:   Post-operative Plan:   Informed Consent: I have reviewed the patients History and Physical, chart, labs and discussed the procedure including the risks, benefits and alternatives for the proposed anesthesia with the patient or authorized representative who has indicated his/her understanding and acceptance.   Dental Advisory Given  Plan Discussed with: Anesthesiologist, CRNA and Surgeon  Anesthesia Plan Comments:         Anesthesia Quick Evaluation

## 2013-12-30 NOTE — Anesthesia Postprocedure Evaluation (Signed)
Anesthesia Post Note  Patient: Janet Boyer  Procedure(s) Performed: Procedure(s) (LRB): FOR MAXIMUM ACCESS (MAS) POSTERIOR LUMBAR INTERBODY FUSION LUMBAR FOUR-FIVE (N/A)  Anesthesia type: general  Patient location: PACU  Post pain: Pain level controlled  Post assessment: Patient's Cardiovascular Status Stable  Last Vitals:  Filed Vitals:   12/30/13 1115  BP: 115/48  Pulse: 74  Temp:   Resp: 12    Post vital signs: Reviewed and stable  Level of consciousness: sedated  Complications: No apparent anesthesia complications

## 2013-12-30 NOTE — Transfer of Care (Signed)
Immediate Anesthesia Transfer of Care Note  Patient: Janet Boyer  Procedure(s) Performed: Procedure(s) with comments: FOR MAXIMUM ACCESS (MAS) POSTERIOR LUMBAR INTERBODY FUSION LUMBAR FOUR-FIVE (N/A) - FOR MAXIMUM ACCESS (MAS) POSTERIOR LUMBAR INTERBODY FUSION LUMBAR FOUR-FIVE  Patient Location: PACU  Anesthesia Type:General  Level of Consciousness: awake, alert  and oriented  Airway & Oxygen Therapy: Patient Spontanous Breathing and Patient connected to nasal cannula oxygen  Post-op Assessment: Report given to PACU RN and Post -op Vital signs reviewed and stable  Post vital signs: Reviewed and stable  Complications: No apparent anesthesia complications

## 2013-12-30 NOTE — Op Note (Signed)
12/30/2013  9:59 AM  PATIENT:  Janet Boyer  73 y.o. female  PRE-OPERATIVE DIAGNOSIS:  Grade 1 spondylolisthesis with spinal stenosis L4-5 with back and left leg pain  POST-OPERATIVE DIAGNOSIS:  Same  PROCEDURE:   1. Decompressive lumbar laminectomy L4-5 requiring more work than would be required for a simple exposure of the disk for PLIF in order to adequately decompress the neural elements and address the spinal stenosis 2. Posterior lumbar interbody fusion L4-5 using PEEK interbody cages packed with morcellized allograft and autograft 3. Posterior fixation L4-5 using cortical pedicle screws.    SURGEON:  Sherley Bounds, MD  ASSISTANTS: Dr. Vertell Limber  ANESTHESIA:  General  EBL: 200 ml  Total I/O In: 1000 [I.V.:1000] Out: 360 [Urine:160; Blood:200]  BLOOD ADMINISTERED:none  DRAINS: Hemovac   INDICATION FOR PROCEDURE: This patient presented with a long history of back and left leg pain. MRI and CT scan showed grade 1 spondylolisthesis at L4-5 with spinal stenosis especially on the left which she had calcified ligamentum flavum. She tried medical management without relief. I recommended decompression and fusion to address her segmental instability and spinal stenosis. Patient understood the risks, benefits, and alternatives and potential outcomes and wished to proceed.  PROCEDURE DETAILS:  The patient was brought to the operating room. After induction of generalized endotracheal anesthesia the patient was rolled into the prone position on chest rolls and all pressure points were padded. The patient's lumbar region was cleaned and then prepped with DuraPrep and draped in the usual sterile fashion. Anesthesia was injected and then a dorsal midline incision was made and carried down to the lumbosacral fascia. The fascia was opened and the paraspinous musculature was taken down in a subperiosteal fashion to expose L4-5. A self-retaining retractor was placed. Intraoperative fluoroscopy  confirmed my level, and I started with placement of the L4 cortical pedicle screws. The pedicle screw entry zones were identified utilizing surface landmarks and  AP and lateral fluoroscopy. I scored the cortex with the high-speed drill and then used the hand drill and EMG monitoring to drill an upward and outward direction into the pedicle. I then tapped line to line, and the tap was also monitored. I then placed a 5-0 x 25 mm cortical pedicle screw into the pedicles of L4 bilaterally. I then turned my attention to the decompression and the spinous process was removed and complete lumbar laminectomies, hemi- facetectomies, and foraminotomies were performed at L4-5. The patient had significant spinal stenosis and this required more work than would be required for a simple exposure of the disc for posterior lumbar interbody fusion. Much more generous decompression was undertaken in order to adequately decompress the neural elements and address the patient's leg pain. The yellow ligament was removed to expose the underlying dura and nerve roots, and generous foraminotomies were performed to adequately decompress the neural elements. Both the exiting and traversing nerve roots were decompressed on both sides until a coronary dilator passed easily along the nerve roots. Once the decompression was complete, I turned my attention to the posterior lower lumbar interbody fusion. The epidural venous vasculature was coagulated and cut sharply. Disc space was incised and the initial discectomy was performed with pituitary rongeurs. The disc space was distracted with sequential distractors to a height of 10 mm. We then used a series of scrapers and shavers to prepare the endplates for fusion. The midline was prepared with Epstein curettes. Once the complete discectomy was finished, we packed an appropriate sized peek interbody cage (10 mm  x 23 mm x 12 lordotic) with local autograft and morcellized allograft, gently retracted  the nerve root, and tapped the cage into position at L4-5.  The midline between the cages was packed with morselized autograft and allograft. We then turned our attention to the placement of the lower pedicle screws. The pedicle screw entry zones were identified utilizing surface landmarks and fluoroscopy. I drilled into each pedicle utilizing the hand drill and EMG monitoring, and tapped each pedicle with the appropriate tap. We palpated with a ball probe to assure no break in the cortex. We then placed 5-0 x 30 mm cortical pedicle screws into the pedicles bilaterally at L5. . We then placed lordotic rods into the multiaxial screw heads of the pedicle screws and locked these in position with the locking caps and anti-torque device. We then checked our construct with AP and lateral fluoroscopy. Irrigated with copious amounts of bacitracin-containing saline solution. Placed a medium Hemovac drain through separate stab incision. Inspected the nerve roots once again to assure adequate decompression, lined to the dura with Gelfoam, and closed the muscle and the fascia with 0 Vicryl. Closed the subcutaneous tissues with 2-0 Vicryl and subcuticular tissues with 3-0 Vicryl. The skin was closed with benzoin and Steri-Strips. Dressing was then applied, the patient was awakened from general anesthesia and transported to the recovery room in stable condition. At the end of the procedure all sponge, needle and instrument counts were correct.   PLAN OF CARE: Admit to inpatient   PATIENT DISPOSITION:  PACU - hemodynamically stable.   Delay start of Pharmacological VTE agent (>24hrs) due to surgical blood loss or risk of bleeding:  yes

## 2013-12-30 NOTE — H&P (Signed)
Subjective: Patient is a 73 y.o. female admitted for PLIF L4-5. Onset of symptoms was several months ago, progressively worse since that time.  The pain is rated severe, and is located at the across the low back and radiates to legs. The pain is described as aching and occurs all day. The symptoms have been progressive. Symptoms are exacerbated by exercise. MRI or CT showed spondylolisthesis with stenosis L4-5.  Past Medical History  Diagnosis Date  . Hypertension   . H/O urinary tract infection     as a young woman  . GERD (gastroesophageal reflux disease)     takes Prilosec  . Arthritis   . High cholesterol   . Chronic back pain     Past Surgical History  Procedure Laterality Date  . Abdominal exploration surgery      as a 73 year old  . Tonsillectomy    . Appendectomy    . Colonoscopy      Prior to Admission medications   Medication Sig Start Date End Date Taking? Authorizing Provider  Calcium Carbonate (CALTRATE 600 PO) Take 600 mg by mouth daily with breakfast.   Yes Historical Provider, MD  diltiazem (MATZIM LA) 180 MG 24 hr tablet Take 180 mg by mouth daily.   Yes Historical Provider, MD  HYDROcodone-acetaminophen (NORCO/VICODIN) 5-325 MG per tablet Take 1 tablet by mouth every 4 (four) hours as needed for moderate pain.   Yes Historical Provider, MD  LORazepam (ATIVAN) 1 MG tablet Take 0.5 mg by mouth daily.   Yes Historical Provider, MD  lovastatin (MEVACOR) 40 MG tablet Take 40 mg by mouth daily.   Yes Historical Provider, MD  omeprazole (PRILOSEC) 40 MG capsule Take 40 mg by mouth daily.   Yes Historical Provider, MD  SALINE NA Place 2 sprays into both nostrils daily as needed (congestion).   Yes Historical Provider, MD  triamterene-hydrochlorothiazide (MAXZIDE-25) 37.5-25 MG per tablet Take 0.5 tablets by mouth daily.   Yes Historical Provider, MD   No Known Allergies  History  Substance Use Topics  . Smoking status: Never Smoker   . Smokeless tobacco: Never Used   . Alcohol Use: 4.2 oz/week    7 Glasses of wine per week    Family History  Problem Relation Age of Onset  . Dementia Mother   . Thyroid disease Mother   . Cancer Sister      Review of Systems  Positive ROS: neg  All other systems have been reviewed and were otherwise negative with the exception of those mentioned in the HPI and as above.  Objective: Vital signs in last 24 hours: Temp:  [97.9 F (36.6 C)] 97.9 F (36.6 C) (02/20 0615) Pulse Rate:  [70] 70 (02/20 0615) Resp:  [20] 20 (02/20 0615) BP: (139)/(70) 139/70 mmHg (02/20 0615) SpO2:  [100 %] 100 % (02/20 0615)  General Appearance: Alert, cooperative, no distress, appears stated age Head: Normocephalic, without obvious abnormality, atraumatic Eyes: PERRL, conjunctiva/corneas clear, EOM's intact    Neck: Supple, symmetrical, trachea midline Back: Symmetric, no curvature, ROM normal, no CVA tenderness Lungs:  respirations unlabored Heart: Regular rate and rhythm Abdomen: Soft, non-tender Extremities: Extremities normal, atraumatic, no cyanosis or edema Pulses: 2+ and symmetric all extremities Skin: Skin color, texture, turgor normal, no rashes or lesions  NEUROLOGIC:   Mental status: Alert and oriented x4,  no aphasia, good attention span, fund of knowledge, and memory Motor Exam - grossly normal Sensory Exam - grossly normal Reflexes: trace Coordination - grossly normal  Gait - grossly normal Balance - grossly normal Cranial Nerves: I: smell Not tested  II: visual acuity  OS: nl    OD: nl  II: visual fields Full to confrontation  II: pupils Equal, round, reactive to light  III,VII: ptosis None  III,IV,VI: extraocular muscles  Full ROM  V: mastication Normal  V: facial light touch sensation  Normal  V,VII: corneal reflex  Present  VII: facial muscle function - upper  Normal  VII: facial muscle function - lower Normal  VIII: hearing Not tested  IX: soft palate elevation  Normal  IX,X: gag reflex  Present  XI: trapezius strength  5/5  XI: sternocleidomastoid strength 5/5  XI: neck flexion strength  5/5  XII: tongue strength  Normal    Data Review Lab Results  Component Value Date   WBC 7.1 12/22/2013   HGB 12.7 12/22/2013   HCT 37.3 12/22/2013   MCV 101.6* 12/22/2013   PLT 226 12/22/2013   Lab Results  Component Value Date   NA 141 12/22/2013   K 4.3 12/22/2013   CL 102 12/22/2013   CO2 24 12/22/2013   BUN 15 12/22/2013   CREATININE 0.73 12/22/2013   GLUCOSE 107* 12/22/2013   Lab Results  Component Value Date   INR 1.05 12/22/2013    Assessment/Plan: Patient admitted for PLIF L4-5. Patient has failed a reasonable attempt at conservative therapy.  I explained the condition and procedure to the patient and answered any questions.  Patient wishes to proceed with procedure as planned. Understands risks/ benefits and typical outcomes of procedure.   JONES,DAVID S 12/30/2013 6:52 AM

## 2013-12-30 NOTE — Anesthesia Procedure Notes (Signed)
Procedure Name: Intubation Date/Time: 12/30/2013 7:42 AM Performed by: Octavio Graves Pre-anesthesia Checklist: Patient identified, Timeout performed, Emergency Drugs available, Suction available and Patient being monitored Patient Re-evaluated:Patient Re-evaluated prior to inductionOxygen Delivery Method: Circle system utilized Preoxygenation: Pre-oxygenation with 100% oxygen Intubation Type: IV induction Ventilation: Mask ventilation without difficulty Laryngoscope Size: Miller and 2 Grade View: Grade I Tube type: Oral Tube size: 7.0 mm Number of attempts: 1 Airway Equipment and Method: Stylet Placement Confirmation: ETT inserted through vocal cords under direct vision,  breath sounds checked- equal and bilateral and positive ETCO2 Secured at: 22 cm Tube secured with: Tape Dental Injury: Teeth and Oropharynx as per pre-operative assessment  Comments: IV induction Singer- intubation AM CRNA atraumatic- front teeth with irregular surface prior to the laryngoscopy- BS bilat Tobias Alexander

## 2013-12-30 NOTE — Progress Notes (Signed)
Pt arrived on unit via stretcher at 1330. Pt A&O x4, foley intact, hemavac in place and unclamped. dsg dry and intact, pt oriented to the unit, call light with in reach. Family in to pt side with belongings. Pt pain assessed; IV intact and infusing. Will continue to monitor quietly.

## 2013-12-31 NOTE — Progress Notes (Signed)
Patient ID: Janet Boyer, female   DOB: 1940-11-30, 73 y.o.   MRN: [id number] Doing well, no pain as pre-op. Wants to wait till tomorrow to go home

## 2013-12-31 NOTE — Evaluation (Signed)
Physical Therapy Evaluation Patient Details Name: Janet Boyer MRN: [id number] DOB: Apr 15, 1941 Today's Date: 12/31/2013 Time: 6270-3500 PT Time Calculation (min): 12 min  PT Assessment / Plan / Recommendation History of Present Illness  pt presents with L4-5 PLIF.    Clinical Impression  Pt very motivated to improve mobility and very excited LE pain is gone.  Will f/u with pt tomorrow to perform steps as pt was not able to ambulate all the way to stairs.  Anticipate great progress.      PT Assessment  Patient needs continued PT services    Follow Up Recommendations  Home health PT;Supervision - Intermittent    Does the patient have the potential to tolerate intense rehabilitation      Barriers to Discharge        Equipment Recommendations  None recommended by PT    Recommendations for Other Services     Frequency Min 5X/week    Precautions / Restrictions Precautions Precautions: Fall;Back Precaution Booklet Issued: Yes (comment) Required Braces or Orthoses: Spinal Brace Spinal Brace: Lumbar corset;Applied in sitting position Restrictions Weight Bearing Restrictions: No   Pertinent Vitals/Pain "Oh this is just from the surgery.  It's nothing like it was."      Mobility  Bed Mobility Overal bed mobility: Needs Assistance Bed Mobility: Rolling;Sidelying to Sit Rolling: Min guard Sidelying to sit: Min guard General bed mobility comments: hand over hand cueing for technique and sequencing, but no physical A needed.   Transfers Overall transfer level: Needs assistance Equipment used: None Transfers: Sit to/from Stand Sit to Stand: Min guard General transfer comment: cues for UE use.   Ambulation/Gait Ambulation/Gait assistance: Min guard Ambulation Distance (Feet): 140 Feet Assistive device: None Gait Pattern/deviations: Step-through pattern;Decreased stride length General Gait Details: pt with very short choppy stride and seems a little nervous, but no  LOB.      Exercises     PT Diagnosis: Difficulty walking  PT Problem List: Decreased activity tolerance;Decreased balance;Decreased mobility;Decreased knowledge of use of DME;Decreased knowledge of precautions PT Treatment Interventions: DME instruction;Gait training;Stair training;Functional mobility training;Therapeutic activities;Therapeutic exercise;Balance training;Neuromuscular re-education;Patient/family education     PT Goals(Current goals can be found in the care plan section) Acute Rehab PT Goals Patient Stated Goal: Home PT Goal Formulation: With patient Time For Goal Achievement: 01/07/14 Potential to Achieve Goals: Good  Visit Information  Last PT Received On: 12/31/13 Assistance Needed: +1 History of Present Illness: pt presents with L4-5 PLIF.         Prior [place] expects to be discharged to:: Private residence Living Arrangements: Spouse/significant other Available Help at Discharge: Family;Friend(s);Available 24 hours/day Type of Home: House Home Access: Stairs to enter [employer] of Steps: 3 Entrance Stairs-Rails: Left Home Layout: One level Home Equipment: Walker - 2 wheels;Cane - single point;Grab bars - tub/shower Prior Function Level of Independence: Independent Communication Communication: No difficulties    Cognition  Cognition Arousal/Alertness: Awake/alert Behavior During Therapy: WFL for tasks assessed/performed Overall Cognitive Status: Within Functional Limits for tasks assessed    Extremity/Trunk Assessment Upper Extremity Assessment Upper Extremity Assessment: Defer to OT evaluation Lower Extremity Assessment Lower Extremity Assessment: Overall WFL for tasks assessed   Balance    End of Session PT - End of Session Equipment Utilized During Treatment: Gait belt;Back brace Activity Tolerance: Patient tolerated treatment well Patient left:  (with OT in bathroom.  ) Nurse Communication:  Mobility status  GP     [place] NidoThornton Papas, [place] 12/31/2013,  11:24 AM

## 2013-12-31 NOTE — Evaluation (Signed)
Occupational Therapy Evaluation Patient Details Name: Janet Boyer MRN: [id number] DOB: [birth year] Today's Date: 12/31/2013 Time: 4854-6270 OT Time Calculation (min): 34 min  OT Assessment / Plan / Recommendation History of present illness pt presents with L4-5 PLIF.     Clinical Impression   Pt demonstrates some balance deficits and difficulty accessing her feet for ADL.  Instructed in use of AE for LB ADL and in back precautions.  Could benefit from practicing stepping over the edge of the tub and reinforcement of back precautions. Do not anticipate pt will need further OT upon discharge.    OT Assessment  Patient needs continued OT Services    Follow Up Recommendations  No OT follow up    Barriers to Discharge      Equipment Recommendations  None recommended by OT    Recommendations for Other Services    Frequency  Min 2X/week    Precautions / Restrictions Precautions Precautions: Fall;Back Precaution Booklet Issued: Yes (comment) Precaution Comments: reviewed back precautions related to ADL Required Braces or Orthoses: Spinal Brace Spinal Brace: Lumbar corset;Applied in sitting position Restrictions Weight Bearing Restrictions: No   Pertinent Vitals/Pain No c/o pain, VSS    ADL  Eating/Feeding: Independent Where Assessed - Eating/Feeding: Chair Grooming: Wash/dry hands;Supervision/safety Where Assessed - Grooming: Unsupported standing Upper Body Bathing: Supervision/safety Where Assessed - Upper Body Bathing: Unsupported standing Lower Body Bathing: Minimal assistance Where Assessed - Lower Body Bathing: Supported standing Upper Body Dressing: Set up Where Assessed - Upper Body Dressing: Unsupported sitting Lower Body Dressing: Minimal assistance Where Assessed - Lower Body Dressing: Unsupported sitting;Supported sit to stand Toilet Transfer: Min Psychiatric nurse Method: Sit to Loss adjuster, chartered: Comfort height toilet;Grab  bars Toileting - Water quality scientist and Hygiene: Modified independent Where Assessed - Best boy and Hygiene: Sit on 3-in-1 or toilet Equipment Used: Gait belt;Back brace Transfers/Ambulation Related to ADLs: min guard assist for ambulation without device ADL Comments: Pt nearly able to donn her socks crossing her foot over the opposite knee, but is somewhat painful.  Instructed in use of AE and gave her information on where she could acquire.      OT Diagnosis: Generalized weakness;Acute pain  OT Problem List: Decreased strength;Pain;Decreased knowledge of use of DME or AE;Decreased knowledge of precautions OT Treatment Interventions: Self-care/ADL training;DME and/or AE instruction;Patient/family education   OT Goals(Current goals can be found in the care plan section) Acute Rehab OT Goals Patient Stated Goal: Home OT Goal Formulation: With patient Time For Goal Achievement: 01/07/14 Potential to Achieve Goals: Good ADL Goals Pt Will Perform Tub/Shower Transfer: with supervision;ambulating;Tub transfer Additional ADL Goal #1: Pt will state 3 of 3 back precautions independently.  Visit Information  Last OT Received On: 12/31/13 Assistance Needed: +1 History of Present Illness: pt presents with L4-5 PLIF.         Prior [place] expects to be discharged to:: Private residence Living Arrangements: Spouse/significant other Available Help at Discharge: Family;Friend(s);Available 24 hours/day Type of Home: House Home Access: Stairs to enter [employer] of Steps: 3 Entrance Stairs-Rails: Left Home Layout: One level Home Equipment: Walker - 2 wheels;Cane - single point;Grab bars - tub/shower;Hand held shower head Prior Function Level of Independence: Independent Communication Communication: No difficulties Dominant Hand: Right         Vision/Perception Vision - History Patient Visual Report: No change  from baseline   Cognition  Cognition Arousal/Alertness: Awake/alert Behavior During Therapy: WFL for tasks assessed/performed  Overall Cognitive Status: Within Functional Limits for tasks assessed    Extremity/Trunk Assessment Upper Extremity Assessment Upper Extremity Assessment: Overall WFL for tasks assessed Lower Extremity Assessment Lower Extremity Assessment: Defer to PT evaluation Cervical / Trunk Assessment Cervical / Trunk Assessment: Normal     Mobility Bed Mobility Overal bed mobility:  (not assessed, pt received from PT while walking)  Transfers Overall transfer level: Needs assistance Equipment used: None Transfers: Sit to/from Stand Sit to Stand: Supervision General transfer comment: cues for UE use.       Exercise     Balance     End of Session OT - End of Session Activity Tolerance: Patient tolerated treatment well Patient left: in chair;with call bell/phone within reach  GO     Malka So 12/31/2013, 1:18 PM [phone]

## 2014-01-01 MED ORDER — METHOCARBAMOL 500 MG PO TABS: 500.0000 mg | ORAL_TABLET | Freq: Four times a day (QID) | ORAL | Status: DC | PRN

## 2014-01-01 MED ORDER — OXYCODONE-ACETAMINOPHEN 5-325 MG PO TABS: 1.0000 | ORAL_TABLET | ORAL | Status: DC | PRN

## 2014-01-01 NOTE — Discharge Summary (Signed)
Physician Discharge Summary  Patient ID: MONISHA SIEBEL MRN: [id number] DOB/AGE: 1941/06/15 73 y.o.  Admit date: 12/30/2013 Discharge date: 01/01/2014  Admission Diagnoses:Spondylolisthesis and stenosis with radiculopathy L 45 level  Discharge Diagnoses: Spondylolisthesis and stenosis with radiculopathy L 45 level  Active Problems:   S/P lumbar spinal fusion   Discharged Condition: good  Hospital Course: Underwent uncomplicated decompression and fusion L 45 level  Consults: None  Significant Diagnostic Studies: None  Treatments: surgery: decompression and fusion L 45 level   Discharge Exam: Blood pressure 124/60, pulse 79, temperature 98.1 F (36.7 C), temperature source Oral, resp. rate 18, height 5\' 6"  (1.676 m), weight 74.39 kg (164 lb), SpO2 100.00%. Neurologic: Alert and oriented X 3, normal strength and tone. Normal symmetric reflexes. Normal coordination and gait Wound:CDI  Disposition: Home     Medication List         CALTRATE 600 PO  Take 600 mg by mouth daily with breakfast.     carbamazepine 200 MG tablet  Commonly known as:  TEGRETOL  Take 50-100 mg by mouth 2 (two) times daily as needed (for trigeminal neuralgia). Take 1/4 tablet in the morning and 1/2 tablet in the evening.     HYDROcodone-acetaminophen 5-325 MG per tablet  Commonly known as:  NORCO/VICODIN  Take 1-2 tablets by mouth every 6 (six) hours as needed (pain).     LORazepam 1 MG tablet  Commonly known as:  ATIVAN  Take 0.5 mg by mouth daily.     lovastatin 40 MG tablet  Commonly known as:  MEVACOR  Take 40 mg by mouth daily.     MATZIM LA 180 MG 24 hr tablet  Generic drug:  diltiazem  Take 180 mg by mouth daily.     methocarbamol 500 MG tablet  Commonly known as:  ROBAXIN  Take 1 tablet (500 mg total) by mouth every 6 (six) hours as needed for muscle spasms.     omeprazole 40 MG capsule  Commonly known as:  PRILOSEC  Take 40 mg by mouth daily.     oxyCODONE-acetaminophen 5-325 MG per tablet  Commonly known as:  PERCOCET/ROXICET  Take 1-2 tablets by mouth every 4 (four) hours as needed for moderate pain.     SALINE NA  Place 2 sprays into both nostrils daily as needed (congestion).     sertraline 25 MG tablet  Commonly known as:  ZOLOFT  Take 25 mg by mouth daily.     triamterene-hydrochlorothiazide 37.5-25 MG per tablet  Commonly known as:  MAXZIDE-25  Take 0.5 tablets by mouth daily.         Signed: Peggyann Shoals, MD 01/01/2014, 10:41 AM

## 2014-01-01 NOTE — Progress Notes (Addendum)
Physical Therapy Treatment Patient Details Name: Janet Boyer MRN: [id number] DOB: Mar 17, 1941 Today's Date: 01/01/2014 Time: 6967-8938 PT Time Calculation (min): 10 min  PT Assessment / Plan / Recommendation  History of Present Illness pt presents with L4-5 PLIF.     PT Comments   Patient doing well with ambulation.  Was able to negotiate 3 stairs today with min guard assist.  Patient ready for discharge from PT perspective.  Will need RW for home use - has rollator, not RW.  To have f/u HHPT at discharge.  Follow Up Recommendations  Home health PT;Supervision - Intermittent     Does the patient have the potential to tolerate intense rehabilitation     Barriers to Discharge        Equipment Recommendations  Rolling walker with 5" wheels    Recommendations for Other Services    Frequency Min 5X/week   Progress towards PT Goals Progress towards PT goals: Progressing toward goals  Plan Current plan remains appropriate;Equipment recommendations need to be updated    Precautions / Restrictions Precautions Precautions: Back Precaution Comments: able to state 3/3 back precautions Required Braces or Orthoses: Spinal Brace Spinal Brace: Lumbar corset;Applied in sitting position Restrictions Weight Bearing Restrictions: No   Pertinent Vitals/Pain     Mobility  Transfers Overall transfer level: Needs assistance Equipment used: Rolling walker (2 wheeled) Transfers: Sit to/from Stand Sit to Stand: Supervision General transfer comment: Cues for hand placement for stand > sit. Ambulation/Gait Ambulation/Gait assistance: Supervision Ambulation Distance (Feet): 120 Feet Assistive device: Rolling walker (2 wheeled) Gait Pattern/deviations: Step-through pattern;Decreased stride length Gait velocity: WFL Gait velocity interpretation: at or above normal speed for age/gender General Gait Details: Gait pattern more smooth today.  Balance good with RW Stairs: Yes Stairs  assistance: Min guard Stair Management: One rail Right;Step to pattern;Forwards;Sideways Number of Stairs: 3 General stair comments: Provided patient and her sister instructions on safe negotiation of stairs using step-to pattern.  Instructed sister on how to safely guard patient on stairs.      PT Goals (current goals can now be found in the care plan section)    Visit Information  Last PT Received On: 01/01/14 Assistance Needed: +1 History of Present Illness: pt presents with L4-5 PLIF.      Subjective Data  Subjective: Patient states she is ready to go home.   Cognition  Cognition Arousal/Alertness: Awake/alert Behavior During Therapy: WFL for tasks assessed/performed Overall Cognitive Status: Within Functional Limits for tasks assessed    Balance     End of Session PT - End of Session Equipment Utilized During Treatment: Gait belt;Back brace Activity Tolerance: Patient tolerated treatment well Patient left: in bed;with call bell/phone within reach;with nursing/sitter in room;with family/visitor present (sitting EOB for OT session) Nurse Communication: Mobility status (Need for RW for home)   GP     Despina Pole 01/01/2014, 11:06 AM Carita Pian. Sanjuana Kava, [place] Pager [phone]

## 2014-01-01 NOTE — Progress Notes (Signed)
   CARE MANAGEMENT NOTE 01/01/2014  Patient:  Janet Boyer, Janet Boyer   Account Number:  [id number]  Date Initiated:  01/01/2014  Documentation initiated by:  [facility]  Subjective/Objective Assessment:   adm: PLIF L4-5     Action/Plan:   discharge planning   Anticipated DC Date:  01/01/2014   Anticipated DC Plan:  [place]  CM consult      [facility]  HOME HEALTH   Choice offered to / List presented to:  C-1 Patient   DME arranged  Vassie Moselle      DME agency  [employer]        Status of service:  Completed, signed off Medicare Important Message given?   (If response is "NO", the following Medicare IM given date fields will be blank) Date Medicare IM given:   Date Additional Medicare IM given:    Discharge Disposition:  HOME/SELF CARE  Per UR Regulation:    If discussed at Long Length of Stay Meetings, dates discussed:    Comments:  01/01/14 12:15 CM spoke with pt in room as PT recc. HHPT. PT refuses and MD did not order.  DME delivery person will deliver Rolling Walker to room prior to discharge.  No other CM needs were communicated.  Mariane Masters, BSN, CM [phone].

## 2014-01-01 NOTE — Progress Notes (Signed)
Subjective: Patient reports doing well  Objective: Vital signs in last 24 hours: Temp:  [97.7 F (36.5 C)-98.4 F (36.9 C)] 98.1 F (36.7 C) (02/22 0958) Pulse Rate:  [71-90] 79 (02/22 0958) Resp:  [18-20] 18 (02/22 0958) BP: (121-141)/(54-63) 124/60 mmHg (02/22 0958) SpO2:  [94 %-100 %] 100 % (02/22 0958)  Intake/Output from previous day: 02/21 0701 - 02/22 0700 In: 1080 [P.O.:1080] Out: 515 [Urine:400; Drains:60] Intake/Output this shift:    Physical Exam: Full strength.  JP 70 cc last shift.  Lab Results: No results found for this basename: WBC, HGB, HCT, PLT,  in the last 72 hours BMET No results found for this basename: NA, K, CL, CO2, GLUCOSE, BUN, CREATININE, CALCIUM,  in the last 72 hours  Studies/Results: Dg Lumbar Spine 2-3 Views  12/30/2013   CLINICAL DATA:  L4-5 fusion.  EXAM: LUMBAR SPINE - 2-3 VIEW  COMPARISON:  CT 11/24/2013  FINDINGS: Examination demonstrates normal vertebral body alignment and heights. There is mild spondylosis. Posterior fusion hardware with pedicle screws is present at the L4-5 level intact. Intervertebral cages present at the L4-5 disc space.  IMPRESSION: Posterior fusion hardware intact at the L4-5 level with intervertebral cage at the L4-5 disc space. Recommend correlation with findings at the time of the procedure.   Electronically Signed   By: Marin Olp M.D.   On: 12/30/2013 12:20    Assessment/Plan: D/C drain.  D/C home.  F/U 01/16/14 with Dr. Ronnald Ramp in office.    LOS: 2 days    Peggyann Shoals, MD 01/01/2014, 10:38 AM

## 2014-01-01 NOTE — Progress Notes (Signed)
Occupational Therapy Treatment and Discharge Patient Details Name: Janet Boyer MRN: [id number] DOB: 05-30-1941 Today's Date: 01/01/2014 Time: 8242-3536 OT Time Calculation (min): 21 min  OT Assessment / Plan / Recommendation  History of present illness pt presents with L4-5 PLIF.     OT comments  This 59 making progress and all education completed with pt and sister. Acute OT will sign off.  Follow Up Recommendations  No OT follow up       Equipment Recommendations  None recommended by OT       Frequency Min 2X/week   Progress towards OT Goals Progress towards OT goals: Progressing toward goals  Plan Discharge plan remains appropriate    Precautions / Restrictions Precautions Precautions: Back Precaution Comments: able to state 3/3 back precautions Required Braces or Orthoses: Spinal Brace Spinal Brace: Lumbar corset;Applied in sitting position Restrictions Weight Bearing Restrictions: No   Pertinent Vitals/Pain No c/o pain    ADL  Equipment Used: Back brace;Rolling walker Transfers/Ambulation Related to ADLs: S for all with RW ADL Comments: Went over the proper techniques with pt and sister how pt would either use her husband's walk in shower or her tub/shower combination using her husband's tub seat in either one.     ns:     OT Goals(current goals can now be found in the care plan section)    Visit Information  Last OT Received On: 01/01/14 Assistance Needed: +1 History of Present Illness: pt presents with L4-5 PLIF.            Cognition  Cognition Arousal/Alertness: Awake/alert Behavior During Therapy: WFL for tasks assessed/performed Overall Cognitive Status: Within Functional Limits for tasks assessed    Mobility  Transfers Overall transfer level: Needs assistance Equipment used: Rolling walker (2 wheeled) Transfers: Sit to/from Stand Sit to Stand: Supervision General transfer comment: Cues for hand placement for stand > sit.           End of Session OT - End of Session Equipment Utilized During Treatment: Back brace;Rolling walker Activity Tolerance: Patient tolerated treatment well Patient left: in bed;with call bell/phone within reach;with family/visitor present    Almon Register [phone] 01/01/2014, 12:07 PM

## 2014-01-02 ENCOUNTER — Encounter (HOSPITAL_COMMUNITY): Payer: Self-pay | Admitting: Neurological Surgery

## 2014-01-16 DIAGNOSIS — Q762 Congenital spondylolisthesis: Secondary | ICD-10-CM | POA: Diagnosis not present

## 2014-01-16 DIAGNOSIS — Z6831 Body mass index (BMI) 31.0-31.9, adult: Secondary | ICD-10-CM | POA: Diagnosis not present

## 2014-01-16 DIAGNOSIS — I10 Essential (primary) hypertension: Secondary | ICD-10-CM | POA: Diagnosis not present

## 2014-02-13 DIAGNOSIS — M549 Dorsalgia, unspecified: Secondary | ICD-10-CM | POA: Diagnosis not present

## 2014-02-13 DIAGNOSIS — Q762 Congenital spondylolisthesis: Secondary | ICD-10-CM | POA: Diagnosis not present

## 2014-03-20 DIAGNOSIS — M549 Dorsalgia, unspecified: Secondary | ICD-10-CM | POA: Diagnosis not present

## 2014-03-28 DIAGNOSIS — H5210 Myopia, unspecified eye: Secondary | ICD-10-CM | POA: Diagnosis not present

## 2014-03-28 DIAGNOSIS — H2510 Age-related nuclear cataract, unspecified eye: Secondary | ICD-10-CM | POA: Diagnosis not present

## 2014-04-13 DIAGNOSIS — H2589 Other age-related cataract: Secondary | ICD-10-CM | POA: Diagnosis not present

## 2014-04-13 DIAGNOSIS — H2510 Age-related nuclear cataract, unspecified eye: Secondary | ICD-10-CM | POA: Diagnosis not present

## 2014-05-04 DIAGNOSIS — H2589 Other age-related cataract: Secondary | ICD-10-CM | POA: Diagnosis not present

## 2014-05-04 DIAGNOSIS — H2510 Age-related nuclear cataract, unspecified eye: Secondary | ICD-10-CM | POA: Diagnosis not present

## 2014-05-05 DIAGNOSIS — H59029 Cataract (lens) fragments in eye following cataract surgery, unspecified eye: Secondary | ICD-10-CM | POA: Diagnosis not present

## 2014-05-05 DIAGNOSIS — Z9889 Other specified postprocedural states: Secondary | ICD-10-CM | POA: Diagnosis not present

## 2014-05-22 DIAGNOSIS — M549 Dorsalgia, unspecified: Secondary | ICD-10-CM | POA: Diagnosis not present

## 2014-05-22 DIAGNOSIS — I10 Essential (primary) hypertension: Secondary | ICD-10-CM | POA: Diagnosis not present

## 2014-05-22 DIAGNOSIS — Z6826 Body mass index (BMI) 26.0-26.9, adult: Secondary | ICD-10-CM | POA: Diagnosis not present

## 2014-05-22 DIAGNOSIS — Q762 Congenital spondylolisthesis: Secondary | ICD-10-CM | POA: Diagnosis not present

## 2014-08-10 DIAGNOSIS — Z23 Encounter for immunization: Secondary | ICD-10-CM | POA: Diagnosis not present

## 2014-11-16 DIAGNOSIS — E785 Hyperlipidemia, unspecified: Secondary | ICD-10-CM | POA: Diagnosis not present

## 2014-11-16 DIAGNOSIS — Z0000 Encounter for general adult medical examination without abnormal findings: Secondary | ICD-10-CM | POA: Diagnosis not present

## 2014-11-16 DIAGNOSIS — I10 Essential (primary) hypertension: Secondary | ICD-10-CM | POA: Diagnosis not present

## 2014-11-16 DIAGNOSIS — R739 Hyperglycemia, unspecified: Secondary | ICD-10-CM | POA: Diagnosis not present

## 2014-11-16 DIAGNOSIS — N390 Urinary tract infection, site not specified: Secondary | ICD-10-CM | POA: Diagnosis not present

## 2014-11-23 DIAGNOSIS — E785 Hyperlipidemia, unspecified: Secondary | ICD-10-CM | POA: Diagnosis not present

## 2014-11-23 DIAGNOSIS — R05 Cough: Secondary | ICD-10-CM | POA: Diagnosis not present

## 2014-11-23 DIAGNOSIS — Z1389 Encounter for screening for other disorder: Secondary | ICD-10-CM | POA: Diagnosis not present

## 2014-11-23 DIAGNOSIS — G500 Trigeminal neuralgia: Secondary | ICD-10-CM | POA: Diagnosis not present

## 2014-11-23 DIAGNOSIS — M549 Dorsalgia, unspecified: Secondary | ICD-10-CM | POA: Diagnosis not present

## 2014-11-23 DIAGNOSIS — Z6827 Body mass index (BMI) 27.0-27.9, adult: Secondary | ICD-10-CM | POA: Diagnosis not present

## 2014-11-23 DIAGNOSIS — Z0000 Encounter for general adult medical examination without abnormal findings: Secondary | ICD-10-CM | POA: Diagnosis not present

## 2014-11-23 DIAGNOSIS — R739 Hyperglycemia, unspecified: Secondary | ICD-10-CM | POA: Diagnosis not present

## 2014-11-23 DIAGNOSIS — Z23 Encounter for immunization: Secondary | ICD-10-CM | POA: Diagnosis not present

## 2014-11-23 DIAGNOSIS — I10 Essential (primary) hypertension: Secondary | ICD-10-CM | POA: Diagnosis not present

## 2014-11-23 DIAGNOSIS — F418 Other specified anxiety disorders: Secondary | ICD-10-CM | POA: Diagnosis not present

## 2015-02-08 DIAGNOSIS — T368X5A Adverse effect of other systemic antibiotics, initial encounter: Secondary | ICD-10-CM | POA: Diagnosis not present

## 2015-02-08 DIAGNOSIS — I10 Essential (primary) hypertension: Secondary | ICD-10-CM | POA: Diagnosis not present

## 2015-02-08 DIAGNOSIS — L509 Urticaria, unspecified: Secondary | ICD-10-CM | POA: Diagnosis not present

## 2015-06-12 DIAGNOSIS — Z961 Presence of intraocular lens: Secondary | ICD-10-CM | POA: Diagnosis not present

## 2015-06-12 DIAGNOSIS — H524 Presbyopia: Secondary | ICD-10-CM | POA: Diagnosis not present

## 2015-07-09 DIAGNOSIS — Z86010 Personal history of colonic polyps: Secondary | ICD-10-CM | POA: Diagnosis not present

## 2015-07-09 DIAGNOSIS — Z09 Encounter for follow-up examination after completed treatment for conditions other than malignant neoplasm: Secondary | ICD-10-CM | POA: Diagnosis not present

## 2015-07-09 DIAGNOSIS — K641 Second degree hemorrhoids: Secondary | ICD-10-CM | POA: Diagnosis not present

## 2015-08-10 DIAGNOSIS — Z23 Encounter for immunization: Secondary | ICD-10-CM | POA: Diagnosis not present

## 2015-11-01 IMAGING — CT CT L SPINE W/O CM
4 of 12 series · 12 of 34 positions shown, 13 images · non-contrast
Comparison: None.

CLINICAL DATA: Low back pain with left buttock and posterior thigh
pain for 2 years. No recent injury or prior relevant surgery.

EXAM:
CT LUMBAR SPINE WITHOUT CONTRAST
TECHNIQUE: Multidetector CT imaging of the lumbar spine was performed without
intravenous contrast administration. Multiplanar CT image
reconstructions were also generated.

[Series 2: l spine bone · axial · 0.27mm/px · z∈[-293,-58]mm · 3 of 95 slices shown, 4 images]
[im 1/95  soft-tissue]
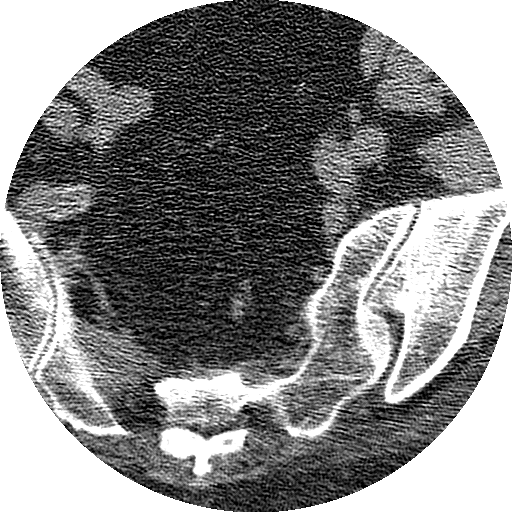
[im 1/95  bone]
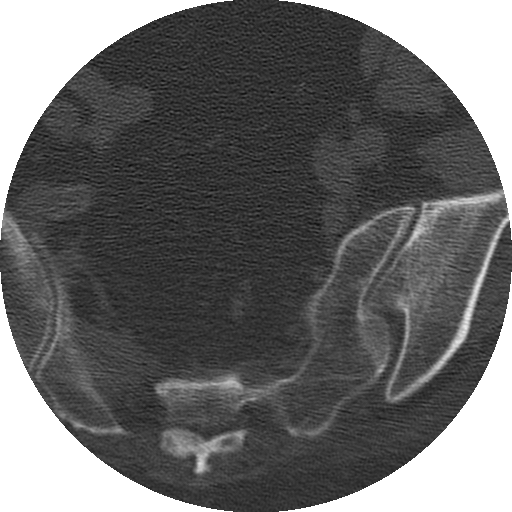
[im 48/95  bone]
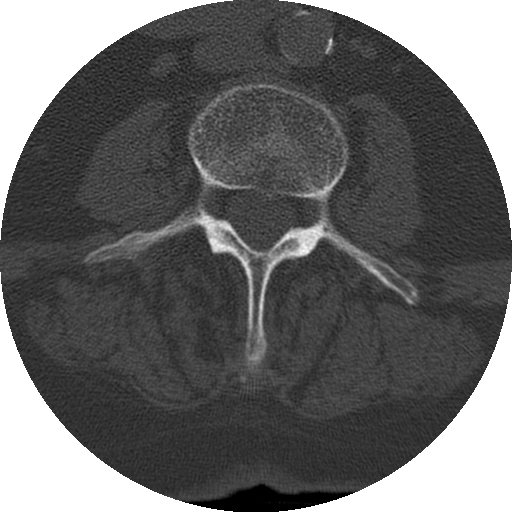
[im 95/95  bone]
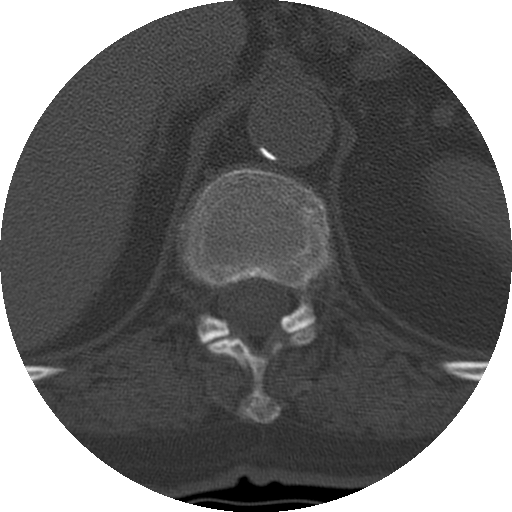

[Series 3: l spine soft · axial · 0.27mm/px · z∈[-215,-138]mm · 2 of 95 slices shown]
[im 32/95  soft-tissue]
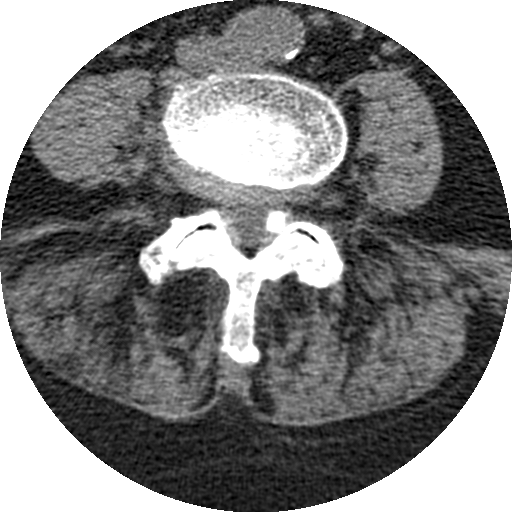
[im 63/95  soft-tissue]
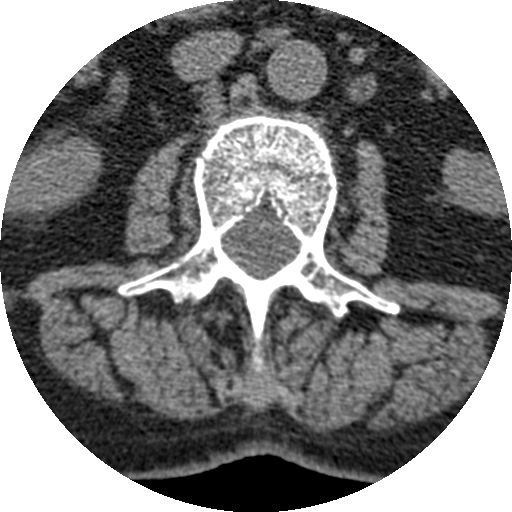

[Series 401: cor lower · coronal · 0.47mm/px · 1 of 58 slices shown]
[im 29/58  bone]
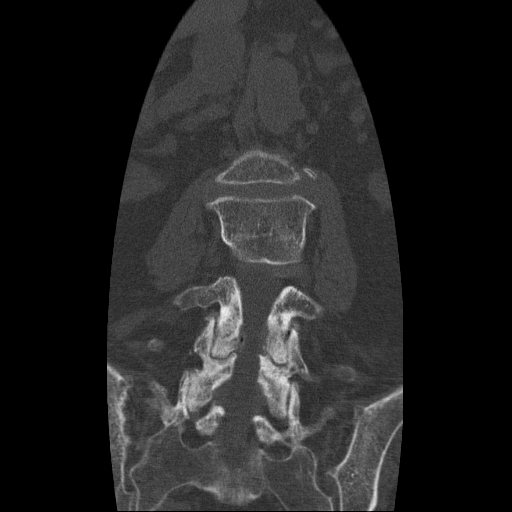

[Series 403: sag lower · sagittal · 0.47mm/px · 6 of 61 slices shown]
[im 6/61  soft-tissue]
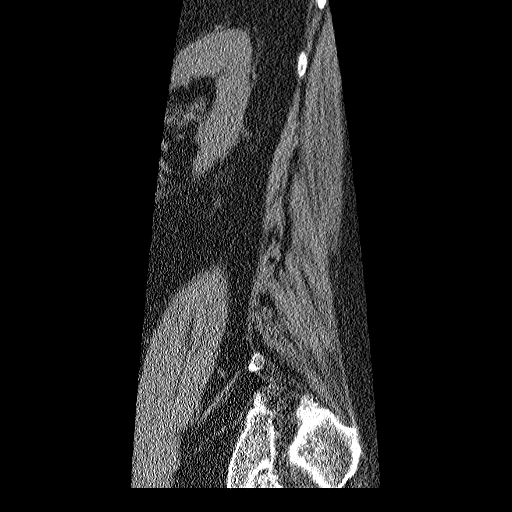
[im 11/61  bone]
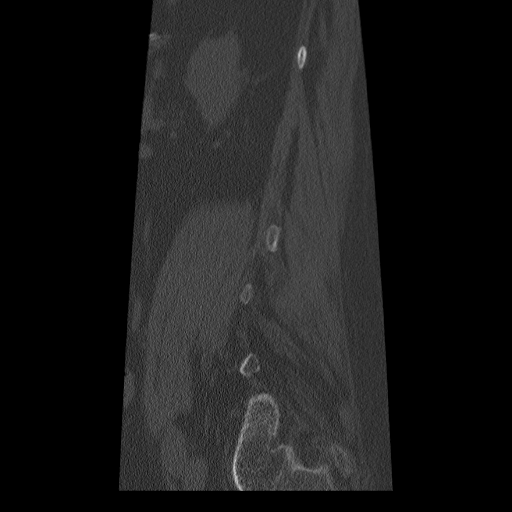
[im 21/61  bone]
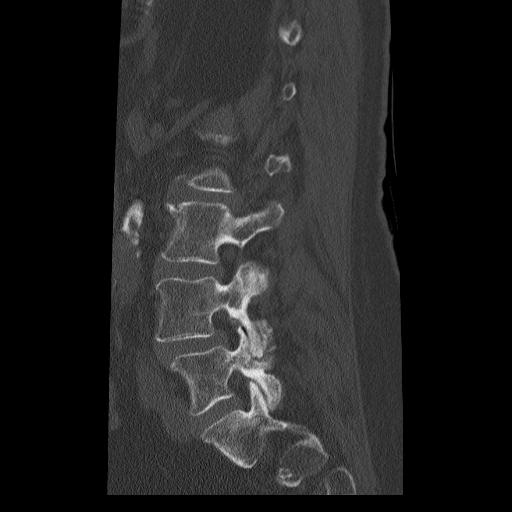
[im 31/61  bone]
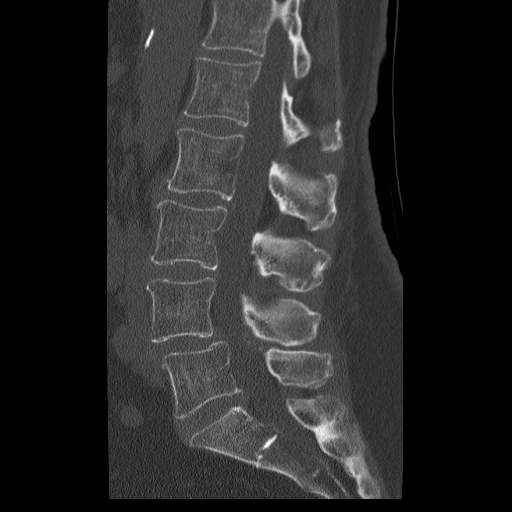
[im 41/61  bone]
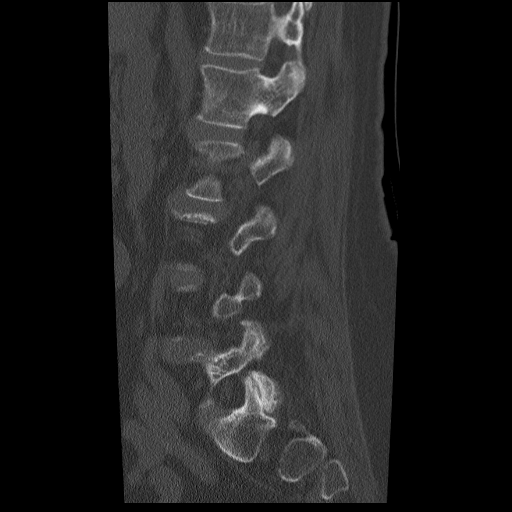
[im 51/61  bone]
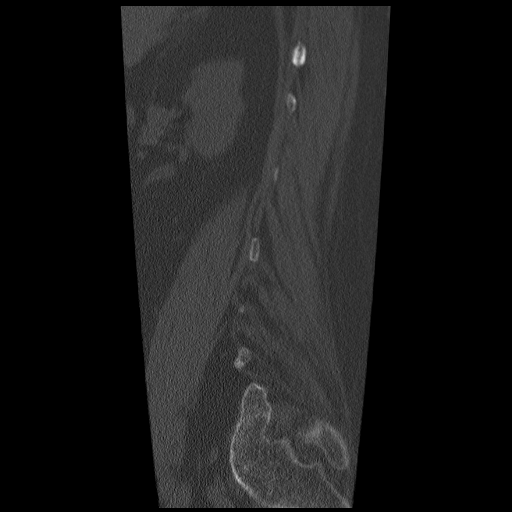

[12 of 34 positions shown; findings below may reference images not displayed]

FINDINGS: There are 5 lumbar type vertebral bodies demonstrating a mild convex
left scoliosis. At L4-5, there is 7 mm of anterolisthesis secondary
to facet disease. The alignment is otherwise normal. There is no
evidence of acute fracture or pars defect.

No paraspinal abnormalities are identified.

There are no significant disc space findings at T12-L1 or L1-2.

L2-3: There is annular disc bulging eccentric to the left with mild
facet and ligamentous hypertrophy. There is mild narrowing of the
left lateral recess. The foramina are sufficiently patent.

L3-4: Mild disc bulging with mild facet and ligamentous hypertrophy.
No spinal stenosis or nerve root encroachment.

L4-5: There is advanced bilateral facet disease accounting for the
grade 1 anterolisthesis. There is a fragmented osteophyte projecting
medially from the left facet joint (or ossification of the
ligamentum flavum) with resulting mass effect on the thecal sac and
probable left L5 nerve root encroachment in the lateral recess.
Annular disc bulging and the anterolisthesis contribute to mild to
moderate central and right lateral recess stenosis. Both foramina
appear sufficiently patent.

L5-S1: Mild disc bulging and bilateral facet hypertrophy. No
significant spinal stenosis or nerve root encroachment.
IMPRESSION: 1. Degenerative disc disease and grade 1 anterolisthesis
contributing to moderate multifactorial spinal stenosis at L4-5.
Focal calcification projecting medial to the left facet joint likely
causes left L5 nerve root encroachment.
2. Mild disc bulging and facet disease at L2-3, L3-4 and L5-S1. No
nerve root encroachment or significant spinal stenosis identified at
the additional levels.

## 2015-12-19 DIAGNOSIS — I10 Essential (primary) hypertension: Secondary | ICD-10-CM | POA: Diagnosis not present

## 2015-12-19 DIAGNOSIS — E784 Other hyperlipidemia: Secondary | ICD-10-CM | POA: Diagnosis not present

## 2015-12-19 DIAGNOSIS — R7309 Other abnormal glucose: Secondary | ICD-10-CM | POA: Diagnosis not present

## 2015-12-27 DIAGNOSIS — R7309 Other abnormal glucose: Secondary | ICD-10-CM | POA: Diagnosis not present

## 2015-12-27 DIAGNOSIS — Z6828 Body mass index (BMI) 28.0-28.9, adult: Secondary | ICD-10-CM | POA: Diagnosis not present

## 2015-12-27 DIAGNOSIS — Z1389 Encounter for screening for other disorder: Secondary | ICD-10-CM | POA: Diagnosis not present

## 2015-12-27 DIAGNOSIS — E784 Other hyperlipidemia: Secondary | ICD-10-CM | POA: Diagnosis not present

## 2015-12-27 DIAGNOSIS — M5489 Other dorsalgia: Secondary | ICD-10-CM | POA: Diagnosis not present

## 2015-12-27 DIAGNOSIS — G500 Trigeminal neuralgia: Secondary | ICD-10-CM | POA: Diagnosis not present

## 2015-12-27 DIAGNOSIS — M79644 Pain in right finger(s): Secondary | ICD-10-CM | POA: Diagnosis not present

## 2015-12-27 DIAGNOSIS — F418 Other specified anxiety disorders: Secondary | ICD-10-CM | POA: Diagnosis not present

## 2015-12-27 DIAGNOSIS — Z0000 Encounter for general adult medical examination without abnormal findings: Secondary | ICD-10-CM | POA: Diagnosis not present

## 2015-12-27 DIAGNOSIS — I10 Essential (primary) hypertension: Secondary | ICD-10-CM | POA: Diagnosis not present

## 2016-06-24 DIAGNOSIS — Z961 Presence of intraocular lens: Secondary | ICD-10-CM | POA: Diagnosis not present

## 2016-06-24 DIAGNOSIS — H524 Presbyopia: Secondary | ICD-10-CM | POA: Diagnosis not present

## 2016-06-24 DIAGNOSIS — H26492 Other secondary cataract, left eye: Secondary | ICD-10-CM | POA: Diagnosis not present

## 2016-08-07 DIAGNOSIS — Z23 Encounter for immunization: Secondary | ICD-10-CM | POA: Diagnosis not present

## 2016-12-29 DIAGNOSIS — R7309 Other abnormal glucose: Secondary | ICD-10-CM | POA: Diagnosis not present

## 2016-12-29 DIAGNOSIS — Z0000 Encounter for general adult medical examination without abnormal findings: Secondary | ICD-10-CM | POA: Diagnosis not present

## 2016-12-29 DIAGNOSIS — I10 Essential (primary) hypertension: Secondary | ICD-10-CM | POA: Diagnosis not present

## 2016-12-29 DIAGNOSIS — E784 Other hyperlipidemia: Secondary | ICD-10-CM | POA: Diagnosis not present

## 2017-01-05 DIAGNOSIS — R7309 Other abnormal glucose: Secondary | ICD-10-CM | POA: Diagnosis not present

## 2017-01-05 DIAGNOSIS — E784 Other hyperlipidemia: Secondary | ICD-10-CM | POA: Diagnosis not present

## 2017-01-05 DIAGNOSIS — M5489 Other dorsalgia: Secondary | ICD-10-CM | POA: Diagnosis not present

## 2017-01-05 DIAGNOSIS — Z1389 Encounter for screening for other disorder: Secondary | ICD-10-CM | POA: Diagnosis not present

## 2017-01-05 DIAGNOSIS — Z0000 Encounter for general adult medical examination without abnormal findings: Secondary | ICD-10-CM | POA: Diagnosis not present

## 2017-01-05 DIAGNOSIS — F418 Other specified anxiety disorders: Secondary | ICD-10-CM | POA: Diagnosis not present

## 2017-01-05 DIAGNOSIS — Z6828 Body mass index (BMI) 28.0-28.9, adult: Secondary | ICD-10-CM | POA: Diagnosis not present

## 2017-01-05 DIAGNOSIS — G500 Trigeminal neuralgia: Secondary | ICD-10-CM | POA: Diagnosis not present

## 2017-01-05 DIAGNOSIS — M79644 Pain in right finger(s): Secondary | ICD-10-CM | POA: Diagnosis not present

## 2017-01-05 DIAGNOSIS — I10 Essential (primary) hypertension: Secondary | ICD-10-CM | POA: Diagnosis not present

## 2017-02-27 DIAGNOSIS — H01111 Allergic dermatitis of right upper eyelid: Secondary | ICD-10-CM | POA: Diagnosis not present

## 2017-02-27 DIAGNOSIS — H01114 Allergic dermatitis of left upper eyelid: Secondary | ICD-10-CM | POA: Diagnosis not present

## 2017-06-30 DIAGNOSIS — Z961 Presence of intraocular lens: Secondary | ICD-10-CM | POA: Diagnosis not present

## 2017-06-30 DIAGNOSIS — H26492 Other secondary cataract, left eye: Secondary | ICD-10-CM | POA: Diagnosis not present

## 2017-07-23 DIAGNOSIS — Z23 Encounter for immunization: Secondary | ICD-10-CM | POA: Diagnosis not present

## 2017-12-31 DIAGNOSIS — E7849 Other hyperlipidemia: Secondary | ICD-10-CM | POA: Diagnosis not present

## 2017-12-31 DIAGNOSIS — I10 Essential (primary) hypertension: Secondary | ICD-10-CM | POA: Diagnosis not present

## 2017-12-31 DIAGNOSIS — R82998 Other abnormal findings in urine: Secondary | ICD-10-CM | POA: Diagnosis not present

## 2017-12-31 DIAGNOSIS — R7309 Other abnormal glucose: Secondary | ICD-10-CM | POA: Diagnosis not present

## 2018-01-07 DIAGNOSIS — G500 Trigeminal neuralgia: Secondary | ICD-10-CM | POA: Diagnosis not present

## 2018-01-07 DIAGNOSIS — Z0000 Encounter for general adult medical examination without abnormal findings: Secondary | ICD-10-CM | POA: Diagnosis not present

## 2018-01-07 DIAGNOSIS — Z1389 Encounter for screening for other disorder: Secondary | ICD-10-CM | POA: Diagnosis not present

## 2018-01-07 DIAGNOSIS — Z6829 Body mass index (BMI) 29.0-29.9, adult: Secondary | ICD-10-CM | POA: Diagnosis not present

## 2018-01-07 DIAGNOSIS — R21 Rash and other nonspecific skin eruption: Secondary | ICD-10-CM | POA: Diagnosis not present

## 2018-01-07 DIAGNOSIS — L508 Other urticaria: Secondary | ICD-10-CM | POA: Diagnosis not present

## 2018-01-07 DIAGNOSIS — R7309 Other abnormal glucose: Secondary | ICD-10-CM | POA: Diagnosis not present

## 2018-01-07 DIAGNOSIS — E7849 Other hyperlipidemia: Secondary | ICD-10-CM | POA: Diagnosis not present

## 2018-01-07 DIAGNOSIS — F418 Other specified anxiety disorders: Secondary | ICD-10-CM | POA: Diagnosis not present

## 2018-01-07 DIAGNOSIS — I10 Essential (primary) hypertension: Secondary | ICD-10-CM | POA: Diagnosis not present

## 2018-07-06 DIAGNOSIS — Z961 Presence of intraocular lens: Secondary | ICD-10-CM | POA: Diagnosis not present

## 2019-01-06 DIAGNOSIS — Z0000 Encounter for general adult medical examination without abnormal findings: Secondary | ICD-10-CM | POA: Diagnosis not present

## 2019-01-06 DIAGNOSIS — R7309 Other abnormal glucose: Secondary | ICD-10-CM | POA: Diagnosis not present

## 2019-01-06 DIAGNOSIS — R82998 Other abnormal findings in urine: Secondary | ICD-10-CM | POA: Diagnosis not present

## 2019-01-06 DIAGNOSIS — I10 Essential (primary) hypertension: Secondary | ICD-10-CM | POA: Diagnosis not present

## 2019-01-06 DIAGNOSIS — E7849 Other hyperlipidemia: Secondary | ICD-10-CM | POA: Diagnosis not present

## 2019-01-13 DIAGNOSIS — Z6826 Body mass index (BMI) 26.0-26.9, adult: Secondary | ICD-10-CM | POA: Diagnosis not present

## 2019-01-13 DIAGNOSIS — F418 Other specified anxiety disorders: Secondary | ICD-10-CM | POA: Diagnosis not present

## 2019-01-13 DIAGNOSIS — G500 Trigeminal neuralgia: Secondary | ICD-10-CM | POA: Diagnosis not present

## 2019-01-13 DIAGNOSIS — I10 Essential (primary) hypertension: Secondary | ICD-10-CM | POA: Diagnosis not present

## 2019-01-13 DIAGNOSIS — Z1331 Encounter for screening for depression: Secondary | ICD-10-CM | POA: Diagnosis not present

## 2019-01-13 DIAGNOSIS — R7309 Other abnormal glucose: Secondary | ICD-10-CM | POA: Diagnosis not present

## 2019-01-13 DIAGNOSIS — Z0000 Encounter for general adult medical examination without abnormal findings: Secondary | ICD-10-CM | POA: Diagnosis not present

## 2019-01-13 DIAGNOSIS — H9193 Unspecified hearing loss, bilateral: Secondary | ICD-10-CM | POA: Diagnosis not present

## 2019-01-13 DIAGNOSIS — Z1339 Encounter for screening examination for other mental health and behavioral disorders: Secondary | ICD-10-CM | POA: Diagnosis not present

## 2019-01-13 DIAGNOSIS — E7849 Other hyperlipidemia: Secondary | ICD-10-CM | POA: Diagnosis not present

## 2019-01-13 DIAGNOSIS — M5489 Other dorsalgia: Secondary | ICD-10-CM | POA: Diagnosis not present

## 2019-01-18 DIAGNOSIS — Z1212 Encounter for screening for malignant neoplasm of rectum: Secondary | ICD-10-CM | POA: Diagnosis not present

## 2019-07-11 DIAGNOSIS — Z961 Presence of intraocular lens: Secondary | ICD-10-CM | POA: Diagnosis not present

## 2019-07-11 DIAGNOSIS — H26492 Other secondary cataract, left eye: Secondary | ICD-10-CM | POA: Diagnosis not present

## 2019-11-30 ENCOUNTER — Ambulatory Visit: Payer: Medicare Other | Attending: Internal Medicine

## 2019-11-30 DIAGNOSIS — Z23 Encounter for immunization: Secondary | ICD-10-CM | POA: Diagnosis not present

## 2019-11-30 NOTE — Progress Notes (Signed)
   Covid-19 Vaccination Clinic  Name:  Janet Boyer    MRN: [id number] DOB: 1941/09/30  11/30/2019  Janet Boyer was observed post Covid-19 immunization for 15 minutes without incidence. She was provided with Vaccine Information Sheet and instruction to access the V-Safe system.   Janet Boyer was instructed to call 911 with any severe reactions post vaccine: Marland Kitchen Difficulty breathing  . Swelling of your face and throat  . A fast heartbeat  . A bad rash all over your body  . Dizziness and weakness    Immunizations Administered    Name Date Dose VIS Date Route   Pfizer COVID-19 Vaccine 11/30/2019  6:44 PM 0.3 mL 10/21/2019 Intramuscular   Manufacturer: [place]   Lot: [id number]   [place]: [id number]

## 2019-12-21 ENCOUNTER — Ambulatory Visit: Payer: Medicare Other | Attending: Internal Medicine

## 2019-12-21 ENCOUNTER — Ambulatory Visit: Payer: Medicare Other

## 2019-12-21 DIAGNOSIS — Z23 Encounter for immunization: Secondary | ICD-10-CM

## 2019-12-21 NOTE — Progress Notes (Signed)
   Covid-19 Vaccination Clinic  Name:  Janet Boyer    MRN: [id number] DOB: 08-10-1941  12/21/2019  Janet Boyer was observed post Covid-19 immunization for 15 minutes without incidence. She was provided with Vaccine Information Sheet and instruction to access the V-Safe system.   Janet Boyer was instructed to call 911 with any severe reactions post vaccine: Marland Kitchen Difficulty breathing  . Swelling of your face and throat  . A fast heartbeat  . A bad rash all over your body  . Dizziness and weakness    Immunizations Administered    Name Date Dose VIS Date Route   Pfizer COVID-19 Vaccine 12/21/2019 10:43 AM 0.3 mL 10/21/2019 Intramuscular   Manufacturer: [place]   Lot: [id number]   [place]: [id number]

## 2020-01-12 DIAGNOSIS — I10 Essential (primary) hypertension: Secondary | ICD-10-CM | POA: Diagnosis not present

## 2020-01-12 DIAGNOSIS — E7849 Other hyperlipidemia: Secondary | ICD-10-CM | POA: Diagnosis not present

## 2020-01-12 DIAGNOSIS — R739 Hyperglycemia, unspecified: Secondary | ICD-10-CM | POA: Diagnosis not present

## 2020-01-12 DIAGNOSIS — R82998 Other abnormal findings in urine: Secondary | ICD-10-CM | POA: Diagnosis not present

## 2020-01-19 DIAGNOSIS — G500 Trigeminal neuralgia: Secondary | ICD-10-CM | POA: Diagnosis not present

## 2020-01-19 DIAGNOSIS — E785 Hyperlipidemia, unspecified: Secondary | ICD-10-CM | POA: Diagnosis not present

## 2020-01-19 DIAGNOSIS — Z1331 Encounter for screening for depression: Secondary | ICD-10-CM | POA: Diagnosis not present

## 2020-01-19 DIAGNOSIS — H9193 Unspecified hearing loss, bilateral: Secondary | ICD-10-CM | POA: Diagnosis not present

## 2020-01-19 DIAGNOSIS — R739 Hyperglycemia, unspecified: Secondary | ICD-10-CM | POA: Diagnosis not present

## 2020-01-19 DIAGNOSIS — E871 Hypo-osmolality and hyponatremia: Secondary | ICD-10-CM | POA: Diagnosis not present

## 2020-01-19 DIAGNOSIS — M549 Dorsalgia, unspecified: Secondary | ICD-10-CM | POA: Diagnosis not present

## 2020-01-19 DIAGNOSIS — F418 Other specified anxiety disorders: Secondary | ICD-10-CM | POA: Diagnosis not present

## 2020-01-19 DIAGNOSIS — Z1339 Encounter for screening examination for other mental health and behavioral disorders: Secondary | ICD-10-CM | POA: Diagnosis not present

## 2020-01-19 DIAGNOSIS — Z0000 Encounter for general adult medical examination without abnormal findings: Secondary | ICD-10-CM | POA: Diagnosis not present

## 2020-01-19 DIAGNOSIS — I10 Essential (primary) hypertension: Secondary | ICD-10-CM | POA: Diagnosis not present

## 2020-03-19 DIAGNOSIS — M79602 Pain in left arm: Secondary | ICD-10-CM | POA: Diagnosis not present

## 2020-03-19 DIAGNOSIS — S42252A Displaced fracture of greater tuberosity of left humerus, initial encounter for closed fracture: Secondary | ICD-10-CM | POA: Diagnosis not present

## 2020-03-19 DIAGNOSIS — W010XXA Fall on same level from slipping, tripping and stumbling without subsequent striking against object, initial encounter: Secondary | ICD-10-CM | POA: Diagnosis not present

## 2020-03-19 DIAGNOSIS — M25512 Pain in left shoulder: Secondary | ICD-10-CM | POA: Diagnosis not present

## 2020-03-19 DIAGNOSIS — S42302A Unspecified fracture of shaft of humerus, left arm, initial encounter for closed fracture: Secondary | ICD-10-CM | POA: Diagnosis not present

## 2020-03-27 DIAGNOSIS — X58XXXA Exposure to other specified factors, initial encounter: Secondary | ICD-10-CM | POA: Diagnosis not present

## 2020-03-27 DIAGNOSIS — W19XXXA Unspecified fall, initial encounter: Secondary | ICD-10-CM | POA: Diagnosis not present

## 2020-03-27 DIAGNOSIS — G8918 Other acute postprocedural pain: Secondary | ICD-10-CM | POA: Diagnosis not present

## 2020-03-27 DIAGNOSIS — S42252A Displaced fracture of greater tuberosity of left humerus, initial encounter for closed fracture: Secondary | ICD-10-CM | POA: Diagnosis not present

## 2020-04-11 DIAGNOSIS — Z4789 Encounter for other orthopedic aftercare: Secondary | ICD-10-CM | POA: Diagnosis not present

## 2020-05-24 DIAGNOSIS — M25512 Pain in left shoulder: Secondary | ICD-10-CM | POA: Diagnosis not present

## 2020-05-28 DIAGNOSIS — M25512 Pain in left shoulder: Secondary | ICD-10-CM | POA: Diagnosis not present

## 2020-05-31 DIAGNOSIS — M25512 Pain in left shoulder: Secondary | ICD-10-CM | POA: Diagnosis not present

## 2020-06-04 DIAGNOSIS — M25512 Pain in left shoulder: Secondary | ICD-10-CM | POA: Diagnosis not present

## 2020-06-04 DIAGNOSIS — Z4789 Encounter for other orthopedic aftercare: Secondary | ICD-10-CM | POA: Diagnosis not present

## 2020-06-04 DIAGNOSIS — S42252D Displaced fracture of greater tuberosity of left humerus, subsequent encounter for fracture with routine healing: Secondary | ICD-10-CM | POA: Diagnosis not present

## 2020-06-19 ENCOUNTER — Encounter (HOSPITAL_COMMUNITY): Payer: Self-pay | Admitting: Orthopedic Surgery

## 2020-06-19 NOTE — Progress Notes (Signed)
DUE TO COVID-19 ONLY ONE VISITOR IS ALLOWED TO COME WITH YOU AND STAY IN THE WAITING ROOM ONLY DURING PRE OP AND PROCEDURE DAY OF SURGERY. THE 1 VISITOR  MAY VISIT WITH YOU AFTER SURGERY IN YOUR PRIVATE ROOM DURING VISITING HOURS ONLY!  YOU NEED TO HAVE A COVID 19 TEST ON__8/16/2021 _____ @_______ , THIS TEST MUST BE DONE BEFORE SURGERY,  COVID TESTING SITE [address] [place] [address], IT IS ON THE RIGHT GOING OUT [address] APPROXIMATELY  2 MINUTES PAST [employer] ON THE RIGHT. ONCE YOUR COVID TEST IS COMPLETED,  PLEASE BEGIN THE QUARANTINE INSTRUCTIONS AS OUTLINED IN YOUR HANDOUT.                CHERIS TWETEN  06/19/2020   Your procedure is scheduled on:  06/28/2020   Report to [facility] Main  Entrance   Report to admitting at    0730 AM     Call this number if you have problems the morning of surgery [phone]    Remember: Do not eat food   l :After Midnight. BRUSH YOUR TEETH MORNING OF SURGERY AND RINSE YOUR MOUTH OUT, NO CHEWING GUM CANDY OR MINTS.  NO SOLID FOOD AFTER MIDNIGHT THE NIGHT PRIOR TO SURGERY. NOTHING BY MOUTH EXCEPT CLEAR LIQUIDS UNTIL  0650am . PLEASE FINISH ENSURE DRINK PER SURGEON ORDER  WHICH NEEDS TO BE COMPLETED AT0650am .    CLEAR LIQUID DIET   Foods Allowed                                                                   Coffee and tea, regular and decaf                              Plain Jell-O any favor except red or purple                                                                          Iced Popsicles                                    Carbonated beverages, regular and diet                                    Cranberry, grape and apple juices Sports drinks like Gatorade Lightly seasoned clear broth or consume(fat free) Sugar, honey syrup  _____________________________________________________________________    Take these medicines the morning of surgery with A SIP OF WATER:  Toprol, Prilosec DO NOT TAKE ANY DIABETIC MEDICATIONS DAY OF YOUR SURGERY                               You may not have any metal on your body including hair pins and              piercings  Do not wear jewelry, make-up, lotions, powders or perfumes, deodorant             Do not wear nail polish on your fingernails.  Do not shave  48 hours prior to surgery.              Men may shave face and neck.   Do not bring valuables to the hospital. [place].  Contacts, dentures or bridgework may not be worn into surgery.  Leave suitcase in the car. After surgery it may be brought to your room.     Patients discharged the day of surgery will not be allowed to drive home. IF YOU ARE HAVING SURGERY AND GOING HOME THE SAME DAY, YOU MUST HAVE AN ADULT TO DRIVE YOU HOME AND BE WITH YOU FOR 24 HOURS. YOU MAY GO HOME BY TAXI OR UBER OR ORTHERWISE, BUT AN ADULT MUST ACCOMPANY YOU HOME AND STAY WITH YOU FOR 24 HOURS.  Name and phone number of your driver:  Special Instructions: N/A              Please read over the following fact sheets you were given: _____________________________________________________________________  [facility] - Preparing for Surgery Before surgery, you can play an important role.  Because skin is not sterile, your skin needs to be as free of germs as possible.  You can reduce the number of germs on your skin by washing with CHG (chlorahexidine gluconate) soap before surgery.  CHG is an antiseptic cleaner which kills germs and bonds with the skin to continue killing germs even after washing. Please DO NOT use if you have an allergy to CHG or antibacterial soaps.  If your skin becomes reddened/irritated stop using the CHG and inform your nurse when you arrive at Short Stay. Do not shave  (including legs and underarms) for at least 48 hours prior to the first CHG shower.  You may shave your face/neck. Please follow these instructions carefully:  1.  Shower with CHG Soap the night before surgery and the  morning of Surgery.  2.  If you choose to wash your hair, wash your hair first as usual with your  normal  shampoo.  3.  After you shampoo, rinse your hair and body thoroughly to remove the  shampoo.                           4.  Use CHG as you would any other liquid soap.  You can apply chg directly  to the skin and wash                       Gently with a scrungie or clean washcloth.  5.  Apply the CHG Soap to your body ONLY FROM THE NECK DOWN.   Do not use on face/ open  Wound or open sores. Avoid contact with eyes, ears mouth and genitals (private parts).                       Wash face,  Genitals (private parts) with your normal soap.             6.  Wash thoroughly, paying special attention to the area where your surgery  will be performed.  7.  Thoroughly rinse your body with warm water from the neck down.  8.  DO NOT shower/wash with your normal soap after using and rinsing off  the CHG Soap.                9.  Pat yourself dry with a clean towel.            10.  Wear clean pajamas.            11.  Place clean sheets on your bed the night of your first shower and do not  sleep with pets. Day of Surgery : Do not apply any lotions/deodorants the morning of surgery.  Please wear clean clothes to the hospital/surgery center.  FAILURE TO FOLLOW THESE INSTRUCTIONS MAY RESULT IN THE CANCELLATION OF YOUR SURGERY PATIENT SIGNATURE_________________________________  NURSE SIGNATURE__________________________________  ________________________________________________________________________   Adam Phenix  An incentive spirometer is a tool that can help keep your lungs clear and active. This tool measures how well you are filling your lungs with  each breath. Taking long deep breaths may help reverse or decrease the chance of developing breathing (pulmonary) problems (especially infection) following:  A long period of time when you are unable to move or be active. BEFORE THE PROCEDURE   If the spirometer includes an indicator to show your best effort, your nurse or respiratory therapist will set it to a desired goal.  If possible, sit up straight or lean slightly forward. Try not to slouch.  Hold the incentive spirometer in an upright position. INSTRUCTIONS FOR USE  1. Sit on the edge of your bed if possible, or sit up as far as you can in bed or on a chair. 2. Hold the incentive spirometer in an upright position. 3. Breathe out normally. 4. Place the mouthpiece in your mouth and seal your lips tightly around it. 5. Breathe in slowly and as deeply as possible, raising the piston or the ball toward the top of the column. 6. Hold your breath for 3-5 seconds or for as long as possible. Allow the piston or ball to fall to the bottom of the column. 7. Remove the mouthpiece from your mouth and breathe out normally. 8. Rest for a few seconds and repeat Steps 1 through 7 at least 10 times every 1-2 hours when you are awake. Take your time and take a few normal breaths between deep breaths. 9. The spirometer may include an indicator to show your best effort. Use the indicator as a goal to work toward during each repetition. 10. After each set of 10 deep breaths, practice coughing to be sure your lungs are clear. If you have an incision (the cut made at the time of surgery), support your incision when coughing by placing a pillow or rolled up towels firmly against it. Once you are able to get out of bed, walk around indoors and cough well. You may stop using the incentive spirometer when instructed by your caregiver.  RISKS AND COMPLICATIONS  Take your time so you do not get  dizzy or light-headed.  If you are in pain, you may need to take or  ask for pain medication before doing incentive spirometry. It is harder to take a deep breath if you are having pain. AFTER USE  Rest and breathe slowly and easily.  It can be helpful to keep track of a log of your progress. Your caregiver can provide you with a simple table to help with this. If you are using the spirometer at home, follow these instructions: [place] IF:   You are having difficultly using the spirometer.  You have trouble using the spirometer as often as instructed.  Your pain medication is not giving enough relief while using the spirometer.  You develop fever of 100.5 F (38.1 C) or higher. SEEK IMMEDIATE MEDICAL CARE IF:   You cough up bloody sputum that had not been present before.  You develop fever of 102 F (38.9 C) or greater.  You develop worsening pain at or near the incision site. MAKE SURE YOU:   Understand these instructions.  Will watch your condition.  Will get help right away if you are not doing well or get worse. Document Released: 03/09/2007 Document Revised: 01/19/2012 Document Reviewed: 05/10/2007 ExitCare Patient Information 2014 [employer].   ________________________________________________________________________  [facility]- Preparing for Total Shoulder Arthroplasty    Before surgery, you can play an important role. Because skin is not sterile, your skin needs to be as free of germs as possible. You can reduce the number of germs on your skin by using the following products. . Benzoyl Peroxide Gel o Reduces the number of germs present on the skin o Applied twice a day to shoulder area starting two days before surgery    ==================================================================  Please follow these instructions carefully:  BENZOYL PEROXIDE 5% GEL  Please do not use if you have an allergy to benzoyl peroxide.   If your skin becomes reddened/irritated stop using the benzoyl peroxide.  Starting two  days before surgery, apply as follows: 1. Apply benzoyl peroxide in the morning and at night. Apply after taking a shower. If you are not taking a shower clean entire shoulder front, back, and side along with the armpit with a clean wet washcloth.  2. Place a quarter-sized dollop on your shoulder and rub in thoroughly, making sure to cover the front, back, and side of your shoulder, along with the armpit.   2 days before ____ AM   ____ PM              1 day before ____ AM   ____ PM                         3. Do this twice a day for two days.  (Last application is the night before surgery, AFTER using the CHG soap as described below).  4. Do NOT apply benzoyl peroxide gel on the day of surgery.

## 2020-06-20 ENCOUNTER — Encounter (HOSPITAL_COMMUNITY)
Admission: RE | Admit: 2020-06-20 | Discharge: 2020-06-20 | Disposition: A | Discharge status: Home / Self Care | Payer: Medicare Other | Source: Ambulatory Visit | Attending: Orthopedic Surgery | Admitting: Orthopedic Surgery

## 2020-06-20 ENCOUNTER — Other Ambulatory Visit: Payer: Self-pay

## 2020-06-20 ENCOUNTER — Encounter (HOSPITAL_COMMUNITY): Payer: Self-pay

## 2020-06-20 DIAGNOSIS — Z01818 Encounter for other preprocedural examination: Secondary | ICD-10-CM | POA: Insufficient documentation

## 2020-06-20 LAB — BASIC METABOLIC PANEL
Anion gap: 9 (ref 5–15)
BUN: 9 mg/dL (ref 8–23)
CO2: 24 mmol/L (ref 22–32)
Calcium: 8.7 mg/dL — ABNORMAL LOW (ref 8.9–10.3)
Chloride: 95 mmol/L — ABNORMAL LOW (ref 98–111)
Creatinine, Ser: 0.5 mg/dL (ref 0.44–1.00)
GFR calc Af Amer: >60 mL/min (ref >60)
GFR calc non Af Amer: >60 mL/min (ref >60)
Glucose, Bld: 109 mg/dL — ABNORMAL HIGH (ref 70–99)
Potassium: 3.7 mmol/L (ref 3.5–5.1)
Sodium: 128 mmol/L — ABNORMAL LOW (ref 135–145)

## 2020-06-20 LAB — CBC
HCT: 37.8 % (ref 36.0–46.0)
Hemoglobin: 13.3 g/dL (ref 12.0–15.0)
MCH: 33.8 pg (ref 26.0–34.0)
MCHC: 35.2 g/dL (ref 30.0–36.0)
MCV: 96.2 fL (ref 80.0–100.0)
Platelets: 250 10*3/uL (ref 150–400)
RBC: 3.93 MIL/uL (ref 3.87–5.11)
RDW: 12.6 % (ref 11.5–15.5)
WBC: 5.1 10*3/uL (ref 4.0–10.5)
nRBC: 0 % (ref 0.0–0.2)

## 2020-06-20 LAB — SURGICAL PCR SCREEN
MRSA, PCR: NEGATIVE
Staphylococcus aureus: NEGATIVE

## 2020-06-20 NOTE — Progress Notes (Addendum)
Anesthesia Review:  PCP: DR Leanna Battles  LOV- 03/19/20- epic  Cardiologist : Chest x-ray : EKG : 06/20/20- Junctional rhytm per uncomfirmed ekg final pending  Final EKG- done 06/20/20- in epic  Echo : Stress test: Cardiac Cath :  Activity level: can do a flight of stairs without difficulty  Sleep Study/ CPAP : Fasting Blood Sugar :      / Checks Blood Sugar -- times a day:   Blood Thinner/ Instructions /Last Dose: ASA / Instructions/ Last Dose :  CBC and BMP done at preop. Na- 128 - faxed via epic to Dr Onnie Graham.

## 2020-06-20 NOTE — Progress Notes (Signed)
Patient was given G2 lower surgery for enhanced recovery drink due to allergy to strawberries.

## 2020-06-20 NOTE — Progress Notes (Signed)
Spoke with pt this pm via phone and clarified medications she is taking and at what times.  Had pt to write down on preop instructions what meds to take am of surgery and nurse documented on preop instructions in chart.

## 2020-06-20 NOTE — Progress Notes (Addendum)
Konrad Felix, PAC was called and informed of junctional rhythm on EKG - unconfirmed waiting on confirmed and Sodium of 128 on proep labs of 06/20/20.  No new orders given.  Chart placed in her office

## 2020-06-21 ENCOUNTER — Encounter: Payer: Self-pay | Admitting: Cardiology

## 2020-06-21 ENCOUNTER — Telehealth: Payer: Self-pay | Admitting: *Deleted

## 2020-06-21 NOTE — Telephone Encounter (Signed)
Error

## 2020-06-21 NOTE — Telephone Encounter (Signed)
   [facility] Pre-operative Risk Assessment    HEARTCARE STAFF: - Please ensure there is not already an duplicate clearance open for this procedure. - Under Visit Info/Reason for Call, type in Other and utilize the format Clearance MM/DD/YY or Clearance TBD. Do not use dashes or single digits. - If request is for dental extraction, please clarify the # of teeth to be extracted.  Request for surgical clearance: PT HAS NEW PT APPT WITH DR. Radford Pax 06/22/20 FOR PRE OP ASSESSMENT  1. What type of surgery is being performed? LEFT REVERSE SHOULDER ARTHROPLASTY   2. When is this surgery scheduled? 06/28/20   3. What type of clearance is required (medical clearance vs. Pharmacy clearance to hold med vs. Both)? MEDICAL  4. Are there any medications that need to be held prior to surgery and how long? NONE LISTED   5. Practice name and name of physician performing surgery? [facility]; DR.KEVIN SUPPLE   6. What is the office phone number? [phone]   7.   What is the office fax number? [phone] ATTN: [place]  8.   Anesthesia type (None, local, MAC, general) ? GENERAL   Janet Boyer 06/21/2020, 2:28 PM  _________________________________________________________________   (provider comments below)

## 2020-06-22 ENCOUNTER — Other Ambulatory Visit: Payer: Self-pay

## 2020-06-22 ENCOUNTER — Encounter: Payer: Self-pay | Admitting: Cardiology

## 2020-06-22 ENCOUNTER — Ambulatory Visit (INDEPENDENT_AMBULATORY_CARE_PROVIDER_SITE_OTHER): Payer: Medicare Other | Admitting: Cardiology

## 2020-06-22 VITALS — BP 126/74 | HR 59 | Ht 66.0 in | Wt 139.6 lb

## 2020-06-22 DIAGNOSIS — I10 Essential (primary) hypertension: Secondary | ICD-10-CM

## 2020-06-22 DIAGNOSIS — Z01818 Encounter for other preprocedural examination: Secondary | ICD-10-CM | POA: Diagnosis not present

## 2020-06-22 NOTE — Telephone Encounter (Signed)
Patient was cleared by Dr. Radford Pax for surgery. Office note has been faxed to the requested party.

## 2020-06-22 NOTE — Telephone Encounter (Signed)
Thank you :)

## 2020-06-22 NOTE — Patient Instructions (Signed)
Medication Instructions:  Your physician recommends that you continue on your current medications as directed. Please refer to the Current Medication list given to you today.  *If you need a refill on your cardiac medications before your next appointment, please call your pharmacy*  Follow-Up: At [facility], you and your health needs are our priority.  As part of our continuing mission to provide you with exceptional heart care, we have created designated Provider Care Teams.  These Care Teams include your primary Cardiologist (physician) and Advanced Practice Providers (APPs -  Physician Assistants and Nurse Practitioners) who all work together to provide you with the care you need, when you need it.  We recommend signing up for the patient portal called "MyChart".  Sign up information is provided on this After Visit Summary.  MyChart is used to connect with patients for Virtual Visits (Telemedicine).  Patients are able to view lab/test results, encounter notes, upcoming appointments, etc.  Non-urgent messages can be sent to your provider as well.   To learn more about what you can do with MyChart, go to [email or web address].

## 2020-06-22 NOTE — Progress Notes (Signed)
Cardiology Constul Note    Date:  06/22/2020   ID:  Janet Boyer, DOB [birth year], MRN [id number]  PCP:  Leanna Battles, MD  Cardiologist:  Fransico Him, MD   Chief Complaint  Patient presents with  . New Patient (Initial Visit)    preop clearance    History of Present Illness:  Janet Boyer is a 79 y.o. female who is being seen today for the evaluation of preoperative cardiac clearance at the request of Leanna Battles, MD.  This is a 79yo female with a hx of HLD, HTN, depression and anxiety.  She has had problems with her shoulder and is scheduled for shoulder surgery by Dr. Onnie Graham.  After getting her preop eval by anesthesia, has no fm hx of CAD.  She is here today for followup and is doing well.  She denies any chest pain or pressure, SOB, DOE, PND, orthopnea, LE edema, dizziness, palpitations or syncope. She is compliant with her meds and is tolerating meds with no SE.  She had an EKG done that was read out as junctional rhythm but in review of the EKG it is NSR.   Past Medical History:  Diagnosis Date  . Anxiety   . Arthritis   . Chronic back pain   . Colon polyps   . Depression   . H/O urinary tract infection    as a young woman  . High cholesterol   . Hyperglycemia   . Hyperlipidemia   . Hypertension   . Hyponatremia   . Pain of left lower extremity   . Trigeminal neuralgia   . Weight gain     Past Surgical History:  Procedure Laterality Date  . ABDOMINAL EXPLORATION SURGERY     as a 79 year old  . APPENDECTOMY    . COLONOSCOPY    . MAXIMUM ACCESS (MAS)POSTERIOR LUMBAR INTERBODY FUSION (PLIF) 1 LEVEL N/A 12/30/2013   Procedure: FOR MAXIMUM ACCESS (MAS) POSTERIOR LUMBAR INTERBODY FUSION LUMBAR FOUR-FIVE;  Surgeon: Eustace Moore, MD;  Location: [place] [facility];  Service: Neurosurgery;  Laterality: N/A;  FOR MAXIMUM ACCESS (MAS) POSTERIOR LUMBAR INTERBODY FUSION LUMBAR FOUR-FIVE  . TONSILLECTOMY      Current Medications: Current Meds    Medication Sig  . carbamazepine (TEGRETOL) 200 MG tablet Take 50-100 mg by mouth 2 (two) times daily as needed (for trigeminal neuralgia). Take 1/4 tablet in the morning and 1/2 tablet in the evening.  Marland Kitchen LORazepam (ATIVAN) 1 MG tablet Take 0.5 mg by mouth daily. Pt takes in the am  . lovastatin (MEVACOR) 40 MG tablet Take 40 mg by mouth daily. Pt takes after eats a meal in the am  . metoprolol succinate (TOPROL-XL) 50 MG 24 hr tablet Take 50 mg by mouth daily. Take with or immediately following a meal.  . omeprazole (PRILOSEC) 40 MG capsule Take 40 mg by mouth daily as needed (reflux).   . triamterene-hydrochlorothiazide (MAXZIDE-25) 37.5-25 MG per tablet Take 0.5 tablets by mouth daily. Takes in the am    Allergies:   Other   Social History   Socioeconomic History  . Marital status: Married    Spouse name: Not on file  . Number of children: 0  . Years of education: 61  . Highest education level: Not on file  Occupational History  . Not on file  Tobacco Use  . Smoking status: Never Smoker  . Smokeless tobacco: Never Used  Vaping Use  . Vaping Use: Never used  Substance and Sexual  Activity  . Alcohol use: Yes    Alcohol/week: 7.0 standard drinks    Types: 7 Glasses of wine per week    Comment: 3 beers daily   . Drug use: No  . Sexual activity: Not on file  Other Topics Concern  . Not on file  Social History Narrative  . Not on file   Social Determinants of Health   Financial Resource Strain:   . Difficulty of Paying Living Expenses:   Food Insecurity:   . Worried About Charity fundraiser in the Last Year:   . Arboriculturist in the Last Year:   Transportation Needs:   . Film/video editor (Medical):   Marland Kitchen Lack of Transportation (Non-Medical):   Physical Activity:   . Days of Exercise per Week:   . Minutes of Exercise per Session:   Stress:   . Feeling of Stress :   Social Connections:   . Frequency of Communication with Friends and Family:   . Frequency of  Social Gatherings with Friends and Family:   . Attends Religious Services:   . Active Member of Clubs or Organizations:   . Attends Archivist Meetings:   Marland Kitchen Marital Status:      Family History:  The patient's family history includes Cancer in her sister; Dementia in her mother; Thyroid disease in her mother.   ROS:   Please see the history of present illness.    ROS All other systems reviewed and are negative.  No flowsheet data found.   PHYSICAL EXAM:   VS:  BP 126/74   Pulse (!) 59   Ht 5\' 6"  (1.676 m)   Wt 139 lb 9.6 oz (63.3 kg)   SpO2 98%   BMI 22.53 kg/m    GEN: Well nourished, well developed, in no acute distress  HEENT: normal  Neck: no JVD, carotid bruits, or masses Cardiac:RRR; no murmurs, rubs, or gallops,no edema.  Intact distal pulses bilaterally.  Respiratory:  clear to auscultation bilaterally, normal work of breathing GI: soft, nontender, nondistended, + BS MS: no deformity or atrophy  Skin: warm and dry, no rash Neuro:  Alert and Oriented x 3, Strength and sensation are intact Psych: euthymic mood, full affect  Wt Readings from Last 3 Encounters:  06/22/20 139 lb 9.6 oz (63.3 kg)  12/31/13 164 lb (74.4 kg)  12/22/13 163 lb 9.6 oz (74.2 kg)      Studies/Labs Reviewed:   EKG:  EKG is ordered today.  The ekg ordered today demonstrates NSR with nonspecific T wave abnormality  Recent Labs: 06/20/2020: BUN 9; Creatinine, Ser 0.50; Hemoglobin 13.3; Platelets 250; Potassium 3.7; Sodium 128   Lipid Panel No results found for: CHOL, TRIG, HDL, CHOLHDL, VLDL, LDLCALC, LDLDIRECT  Additional studies/ records that were reviewed today include:  OV notes from PCP  ASSESSMENT:    1. Preoperative clearance   2. Benign essential HTN     PLAN:  In order of problems listed above:  1. Preoperative clearance -she has no cardiac symptoms.  -her only risk factor is her age -her revised cardiac risk index is low with a 0.4% of periop risk of major  cardiac events -her functional capacity based on [facility] activity status index is > 4.5 mets and therefore no further cardiac  Workup is indicated at this time.  2.  HTN -BP controlled -continue Toprol XL 50mg  daily and Maxzide 37.5-25mg  1/2 tab daily  Medication Adjustments/Labs and Tests Ordered: Current medicines are reviewed  at length with the patient today.  Concerns regarding medicines are outlined above.  Medication changes, Labs and Tests ordered today are listed in the Patient Instructions below.  Patient Instructions  Medication Instructions:  Your physician recommends that you continue on your current medications as directed. Please refer to the Current Medication list given to you today.  *If you need a refill on your cardiac medications before your next appointment, please call your pharmacy*  Follow-Up: At [facility], you and your health needs are our priority.  As part of our continuing mission to provide you with exceptional heart care, we have created designated Provider Care Teams.  These Care Teams include your primary Cardiologist (physician) and Advanced Practice Providers (APPs -  Physician Assistants and Nurse Practitioners) who all work together to provide you with the care you need, when you need it.  We recommend signing up for the patient portal called "MyChart".  Sign up information is provided on this After Visit Summary.  MyChart is used to connect with patients for Virtual Visits (Telemedicine).  Patients are able to view lab/test results, encounter notes, upcoming appointments, etc.  Non-urgent messages can be sent to your provider as well.   To learn more about what you can do with MyChart, go to [email or web address].         Signed, Fransico Him, MD  06/22/2020 10:52 AM    [facility] [address], [place], [place]  [address] Phone: [phone]; Fax: [phone]

## 2020-06-25 ENCOUNTER — Other Ambulatory Visit (HOSPITAL_COMMUNITY)
Admission: RE | Admit: 2020-06-25 | Discharge: 2020-06-25 | Disposition: A | Discharge status: Home / Self Care | Payer: Medicare Other | Source: Ambulatory Visit | Attending: Orthopedic Surgery | Admitting: Orthopedic Surgery

## 2020-06-25 DIAGNOSIS — Z20822 Contact with and (suspected) exposure to covid-19: Secondary | ICD-10-CM | POA: Diagnosis not present

## 2020-06-25 DIAGNOSIS — Z01812 Encounter for preprocedural laboratory examination: Secondary | ICD-10-CM | POA: Diagnosis not present

## 2020-06-25 LAB — SARS CORONAVIRUS 2 (TAT 6-24 HRS): SARS Coronavirus 2: NEGATIVE

## 2020-06-25 NOTE — Anesthesia Preprocedure Evaluation (Addendum)
Anesthesia Evaluation  Patient identified by MRN, date of birth, ID band Patient awake    Reviewed: Allergy & Precautions, NPO status , Patient's Chart, lab work & pertinent test results  Airway Mallampati: II  TM Distance: >3 FB Neck ROM: Full    Dental no notable dental hx. (+) Teeth Intact, Dental Advisory Given   Pulmonary neg pulmonary ROS,    Pulmonary exam normal breath sounds clear to auscultation       Cardiovascular Exercise Tolerance: Good hypertension, Pt. on medications Normal cardiovascular exam Rhythm:Regular Rate:Normal     Neuro/Psych Anxiety Depression  Neuromuscular disease    GI/Hepatic negative GI ROS, Neg liver ROS,   Endo/Other  negative endocrine ROS  Renal/GU K+ 3.7 Cr 0.50  negative genitourinary   Musculoskeletal  (+) Arthritis ,   Abdominal   Peds  Hematology Hgb 13.3   Anesthesia Other Findings   Reproductive/Obstetrics negative OB ROS                           Anesthesia Physical Anesthesia Plan  ASA: III  Anesthesia Plan: General   Post-op Pain Management:  Regional for Post-op pain   Induction: Intravenous  PONV Risk Score and Plan: 3 and Treatment may vary due to age or medical condition and Ondansetron  Airway Management Planned: Oral ETT  Additional Equipment: None  Intra-op Plan:   Post-operative Plan:   Informed Consent: I have reviewed the patients History and Physical, chart, labs and discussed the procedure including the risks, benefits and alternatives for the proposed anesthesia with the patient or authorized representative who has indicated his/her understanding and acceptance.     Dental advisory given  Plan Discussed with: Anesthesiologist and CRNA  Anesthesia Plan Comments: (See PAT note 06/20/2020, Konrad Felix, PA-C  GA + L ISB w Exparel)      Anesthesia Quick Evaluation

## 2020-06-25 NOTE — Addendum Note (Signed)
Addended by: Antonieta Iba on: 06/25/2020 04:21 PM   Modules accepted: Orders

## 2020-06-25 NOTE — Progress Notes (Signed)
Anesthesia Chart Review   Case: [id number] Date/Time: 06/28/20 4742   Procedure: REVERSE SHOULDER ARTHROPLASTY (Left Shoulder) - 128min   Anesthesia type: General   Pre-op diagnosis: Left shoulder rotator cuff tear arthropathy   Location: [facility]OM 06 / [facility]   Surgeons: Justice Britain, MD      DISCUSSION: 79 y.o. never smoker with h/o HTN, left shoulder rotator cuff tear scheduled for above procedure 06/28/2020 with Dr. Justice Britain.   EKG at PAT visit 06/20/2020 with Junctional rhythm.  Pt followed up with cardiology 06/22/2020.  Dr. Tressia Miners Turner's review EKG NSR.  Per OV note,  "-she has no cardiac symptoms.  -her only risk factor is her age -her revised cardiac risk index is low with a 0.4% of periop risk of major cardiac events -her functional capacity based on [facility] activity status index is > 4.5 mets and therefore no further cardiac  Workup is indicated at this time."  Sodium 128 at PAT visit.  Forwarded to PCP.  Will recheck DOS.  VS: BP (!) 146/58   Pulse (!) 57   Temp (!) 36 C (Oral)   Resp 16   SpO2 100%   PROVIDERS: Leanna Battles, MD is PCP    LABS: Labs reviewed: Acceptable for surgery. (all labs ordered are listed, but only abnormal results are displayed)  Labs Reviewed  BASIC METABOLIC PANEL - Abnormal; Notable for the following components:      Result Value   Sodium 128 (*)    Chloride 95 (*)    Glucose, Bld 109 (*)    Calcium 8.7 (*)    All other components within normal limits  SURGICAL PCR SCREEN  CBC     IMAGES:   EKG: 06/20/2020 Rate 56 bpm  Junctional rhythm Nonspecific T wave abnormality Abnormal ECG Since previous tracing (no P-waves found) - appears to be Junctional rhythm  CV:  Past Medical History:  Diagnosis Date  . Anxiety   . Arthritis   . Chronic back pain   . Colon polyps   . Depression   . H/O urinary tract infection    as a young woman  . High cholesterol   . Hyperglycemia   . Hyperlipidemia   . Hypertension   .  Hyponatremia   . Pain of left lower extremity   . Trigeminal neuralgia   . Weight gain     Past Surgical History:  Procedure Laterality Date  . ABDOMINAL EXPLORATION SURGERY     as a 79 year old  . APPENDECTOMY    . COLONOSCOPY    . MAXIMUM ACCESS (MAS)POSTERIOR LUMBAR INTERBODY FUSION (PLIF) 1 LEVEL N/A 12/30/2013   Procedure: FOR MAXIMUM ACCESS (MAS) POSTERIOR LUMBAR INTERBODY FUSION LUMBAR FOUR-FIVE;  Surgeon: Eustace Moore, MD;  Location: [facility];  Service: Neurosurgery;  Laterality: N/A;  FOR MAXIMUM ACCESS (MAS) POSTERIOR LUMBAR INTERBODY FUSION LUMBAR FOUR-FIVE  . TONSILLECTOMY      MEDICATIONS: . metoprolol succinate (TOPROL-XL) 50 MG 24 hr tablet  . carbamazepine (TEGRETOL) 200 MG tablet  . LORazepam (ATIVAN) 1 MG tablet  . lovastatin (MEVACOR) 40 MG tablet  . omeprazole (PRILOSEC) 40 MG capsule  . triamterene-hydrochlorothiazide (MAXZIDE-25) 37.5-25 MG per tablet   No current facility-administered medications for this encounter.     Konrad Felix, PA-C [facility] [phone]

## 2020-06-26 NOTE — Progress Notes (Signed)
Clearance received from cardiology- Dr Fransico Him done 06/22/20.

## 2020-06-28 ENCOUNTER — Encounter (HOSPITAL_COMMUNITY)
Admission: RE | Disposition: A | Discharge status: Home / Self Care | Payer: Self-pay | Source: Home / Self Care | Attending: Orthopedic Surgery

## 2020-06-28 ENCOUNTER — Ambulatory Visit (HOSPITAL_COMMUNITY): Payer: Medicare Other | Admitting: Physician Assistant

## 2020-06-28 ENCOUNTER — Other Ambulatory Visit: Payer: Self-pay

## 2020-06-28 ENCOUNTER — Ambulatory Visit (HOSPITAL_COMMUNITY)
Admission: RE | Admit: 2020-06-28 | Discharge: 2020-06-28 | Disposition: A | Discharge status: Home / Self Care | Payer: Medicare Other | Attending: Orthopedic Surgery | Admitting: Orthopedic Surgery

## 2020-06-28 ENCOUNTER — Ambulatory Visit (HOSPITAL_COMMUNITY): Payer: Medicare Other | Admitting: Anesthesiology

## 2020-06-28 DIAGNOSIS — M12812 Other specific arthropathies, not elsewhere classified, left shoulder: Secondary | ICD-10-CM | POA: Diagnosis not present

## 2020-06-28 DIAGNOSIS — I10 Essential (primary) hypertension: Secondary | ICD-10-CM | POA: Insufficient documentation

## 2020-06-28 DIAGNOSIS — F419 Anxiety disorder, unspecified: Secondary | ICD-10-CM | POA: Insufficient documentation

## 2020-06-28 DIAGNOSIS — Z79899 Other long term (current) drug therapy: Secondary | ICD-10-CM | POA: Insufficient documentation

## 2020-06-28 DIAGNOSIS — E785 Hyperlipidemia, unspecified: Secondary | ICD-10-CM | POA: Diagnosis not present

## 2020-06-28 DIAGNOSIS — M19012 Primary osteoarthritis, left shoulder: Secondary | ICD-10-CM | POA: Diagnosis not present

## 2020-06-28 DIAGNOSIS — M19112 Post-traumatic osteoarthritis, left shoulder: Secondary | ICD-10-CM | POA: Diagnosis not present

## 2020-06-28 DIAGNOSIS — F418 Other specified anxiety disorders: Secondary | ICD-10-CM | POA: Diagnosis not present

## 2020-06-28 DIAGNOSIS — G8918 Other acute postprocedural pain: Secondary | ICD-10-CM | POA: Diagnosis not present

## 2020-06-28 DIAGNOSIS — Z96612 Presence of left artificial shoulder joint: Secondary | ICD-10-CM

## 2020-06-28 HISTORY — PX: REVERSE SHOULDER ARTHROPLASTY: SHX5054

## 2020-06-28 SURGERY — ARTHROPLASTY, SHOULDER, TOTAL, REVERSE
Anesthesia: General | Site: Shoulder | Laterality: Left

## 2020-06-28 MED ORDER — OXYCODONE-ACETAMINOPHEN 5-325 MG PO TABS
1.0000 | ORAL_TABLET | ORAL | 0 refills | Status: DC | PRN
Start: 2020-06-28 — End: 2023-12-15

## 2020-06-28 MED ORDER — ACETAMINOPHEN 10 MG/ML IV SOLN
INTRAVENOUS | Status: AC
  Filled 2020-06-28: qty 100

## 2020-06-28 MED ORDER — PHENYLEPHRINE HCL (PRESSORS) 10 MG/ML IV SOLN
INTRAVENOUS | Status: AC
  Filled 2020-06-28: qty 1

## 2020-06-28 MED ORDER — PHENYLEPHRINE HCL-NACL 10-0.9 MG/250ML-% IV SOLN
INTRAVENOUS | Status: DC | PRN
  Administered 2020-06-28: 25 ug/min via INTRAVENOUS

## 2020-06-28 MED ORDER — ONDANSETRON HCL 4 MG/2ML IJ SOLN: 4.0000 mg | Freq: Once | INTRAMUSCULAR | Status: DC | PRN

## 2020-06-28 MED ORDER — ONDANSETRON HCL 4 MG PO TABS: 4.0000 mg | ORAL_TABLET | Freq: Three times a day (TID) | ORAL | 0 refills | Status: DC | PRN

## 2020-06-28 MED ORDER — FENTANYL CITRATE (PF) 100 MCG/2ML IJ SOLN
50.0000 ug | INTRAMUSCULAR | Status: DC
  Administered 2020-06-28: 50 ug via INTRAVENOUS

## 2020-06-28 MED ORDER — ACETAMINOPHEN 10 MG/ML IV SOLN
1000.0000 mg | Freq: Once | INTRAVENOUS | Status: DC | PRN
  Administered 2020-06-28: 1000 mg via INTRAVENOUS

## 2020-06-28 MED ORDER — EPHEDRINE SULFATE-NACL 50-0.9 MG/10ML-% IV SOSY
PREFILLED_SYRINGE | INTRAVENOUS | Status: DC | PRN
  Administered 2020-06-28 (×3): 10 mg via INTRAVENOUS

## 2020-06-28 MED ORDER — DEXAMETHASONE SODIUM PHOSPHATE 10 MG/ML IJ SOLN
INTRAMUSCULAR | Status: AC
  Filled 2020-06-28: qty 1

## 2020-06-28 MED ORDER — SUGAMMADEX SODIUM 200 MG/2ML IV SOLN
INTRAVENOUS | Status: DC | PRN
  Administered 2020-06-28: 130 mg via INTRAVENOUS

## 2020-06-28 MED ORDER — LABETALOL HCL 5 MG/ML IV SOLN
5.0000 mg | Freq: Once | INTRAVENOUS | Status: AC
  Administered 2020-06-28: 5 mg via INTRAVENOUS

## 2020-06-28 MED ORDER — MIDAZOLAM HCL 2 MG/2ML IJ SOLN: 1.0000 mg | INTRAMUSCULAR | Status: DC

## 2020-06-28 MED ORDER — EPHEDRINE 5 MG/ML INJ
INTRAVENOUS | Status: AC
  Filled 2020-06-28: qty 10

## 2020-06-28 MED ORDER — NAPROXEN 500 MG PO TABS: 500.0000 mg | ORAL_TABLET | Freq: Two times a day (BID) | ORAL | 1 refills | Status: DC

## 2020-06-28 MED ORDER — LACTATED RINGERS IV BOLUS
500.0000 mL | Freq: Once | INTRAVENOUS | Status: AC
  Administered 2020-06-28: 500 mL via INTRAVENOUS

## 2020-06-28 MED ORDER — LACTATED RINGERS IV SOLN: INTRAVENOUS | Status: DC

## 2020-06-28 MED ORDER — BUPIVACAINE LIPOSOME 1.3 % IJ SUSP
INTRAMUSCULAR | Status: DC | PRN
  Administered 2020-06-28: 10 mL via PERINEURAL

## 2020-06-28 MED ORDER — MIDAZOLAM HCL 2 MG/2ML IJ SOLN
INTRAMUSCULAR | Status: AC
  Filled 2020-06-28: qty 2

## 2020-06-28 MED ORDER — CHLORHEXIDINE GLUCONATE 0.12 % MT SOLN
15.0000 mL | Freq: Once | OROMUCOSAL | Status: AC
  Administered 2020-06-28: 15 mL via OROMUCOSAL

## 2020-06-28 MED ORDER — VANCOMYCIN HCL 1000 MG IV SOLR
INTRAVENOUS | Status: AC
  Filled 2020-06-28: qty 1000

## 2020-06-28 MED ORDER — ROCURONIUM BROMIDE 10 MG/ML (PF) SYRINGE
PREFILLED_SYRINGE | INTRAVENOUS | Status: DC | PRN
  Administered 2020-06-28: 50 mg via INTRAVENOUS
  Administered 2020-06-28 (×2): 10 mg via INTRAVENOUS

## 2020-06-28 MED ORDER — ROCURONIUM BROMIDE 10 MG/ML (PF) SYRINGE
PREFILLED_SYRINGE | INTRAVENOUS | Status: AC
  Filled 2020-06-28: qty 10

## 2020-06-28 MED ORDER — CEFAZOLIN SODIUM-DEXTROSE 2-4 GM/100ML-% IV SOLN
2.0000 g | INTRAVENOUS | Status: AC
  Administered 2020-06-28: 2 g via INTRAVENOUS
  Filled 2020-06-28: qty 100

## 2020-06-28 MED ORDER — DEXAMETHASONE SODIUM PHOSPHATE 10 MG/ML IJ SOLN
INTRAMUSCULAR | Status: DC | PRN
  Administered 2020-06-28: 8 mg via INTRAVENOUS

## 2020-06-28 MED ORDER — FENTANYL CITRATE (PF) 100 MCG/2ML IJ SOLN
INTRAMUSCULAR | Status: AC
  Filled 2020-06-28: qty 2

## 2020-06-28 MED ORDER — PROPOFOL 10 MG/ML IV BOLUS
INTRAVENOUS | Status: AC
  Filled 2020-06-28: qty 20

## 2020-06-28 MED ORDER — ONDANSETRON HCL 4 MG/2ML IJ SOLN
INTRAMUSCULAR | Status: AC
  Filled 2020-06-28: qty 2

## 2020-06-28 MED ORDER — TRANEXAMIC ACID-NACL 1000-0.7 MG/100ML-% IV SOLN
1000.0000 mg | INTRAVENOUS | Status: AC
  Administered 2020-06-28: 1000 mg via INTRAVENOUS
  Filled 2020-06-28: qty 100

## 2020-06-28 MED ORDER — BUPIVACAINE-EPINEPHRINE (PF) 0.5% -1:200000 IJ SOLN
INTRAMUSCULAR | Status: DC | PRN
  Administered 2020-06-28: 15 mL via PERINEURAL

## 2020-06-28 MED ORDER — PROPOFOL 10 MG/ML IV BOLUS
INTRAVENOUS | Status: DC | PRN
  Administered 2020-06-28: 100 mg via INTRAVENOUS

## 2020-06-28 MED ORDER — LABETALOL HCL 5 MG/ML IV SOLN
INTRAVENOUS | Status: AC
  Filled 2020-06-28: qty 4

## 2020-06-28 MED ORDER — CYCLOBENZAPRINE HCL 10 MG PO TABS: 10.0000 mg | ORAL_TABLET | Freq: Three times a day (TID) | ORAL | 1 refills | Status: DC | PRN

## 2020-06-28 MED ORDER — ORAL CARE MOUTH RINSE: 15.0000 mL | Freq: Once | OROMUCOSAL | Status: AC

## 2020-06-28 MED ORDER — FENTANYL CITRATE (PF) 100 MCG/2ML IJ SOLN
INTRAMUSCULAR | Status: DC | PRN
Start: 2020-06-28 — End: 2020-06-28
  Administered 2020-06-28: 50 ug via INTRAVENOUS

## 2020-06-28 MED ORDER — ONDANSETRON HCL 4 MG/2ML IJ SOLN
INTRAMUSCULAR | Status: DC | PRN
  Administered 2020-06-28: 4 mg via INTRAVENOUS

## 2020-06-28 MED ORDER — FENTANYL CITRATE (PF) 100 MCG/2ML IJ SOLN: 25.0000 ug | INTRAMUSCULAR | Status: DC | PRN

## 2020-06-28 MED ORDER — LIDOCAINE 2% (20 MG/ML) 5 ML SYRINGE
INTRAMUSCULAR | Status: AC
  Filled 2020-06-28: qty 5

## 2020-06-28 MED ORDER — LIDOCAINE 2% (20 MG/ML) 5 ML SYRINGE
INTRAMUSCULAR | Status: DC | PRN
  Administered 2020-06-28 (×2): 50 mg via INTRAVENOUS

## 2020-06-28 SURGICAL SUPPLY — 69 items
ADH SKN CLS APL DERMABOND .7 (GAUZE/BANDAGES/DRESSINGS) ×1
AID PSTN UNV HD RSTRNT DISP (MISCELLANEOUS) ×1
BAG SPEC THK2 15X12 ZIP CLS (MISCELLANEOUS) ×1
BAG ZIPLOCK 12X15 (MISCELLANEOUS) ×3 IMPLANT
BLADE SAW SGTL 83.5X18.5 (BLADE) ×3 IMPLANT
BSPLAT GLND +2X24 MDLR (Joint) ×1 IMPLANT
COOLER ICEMAN CLASSIC (MISCELLANEOUS) IMPLANT
COVER BACK TABLE 60X90IN (DRAPES) ×3 IMPLANT
COVER SURGICAL LIGHT HANDLE (MISCELLANEOUS) ×3 IMPLANT
COVER WAND RF STERILE (DRAPES) IMPLANT
CUP SUT UNIV REVERS 36 NEUTRAL (Cup) ×3 IMPLANT
DERMABOND ADVANCED (GAUZE/BANDAGES/DRESSINGS) ×2
DERMABOND ADVANCED .7 DNX12 (GAUZE/BANDAGES/DRESSINGS) ×1 IMPLANT
DRAPE INCISE IOBAN 66X45 STRL (DRAPES) IMPLANT
DRAPE ORTHO SPLIT 77X108 STRL (DRAPES) ×6
DRAPE SHEET LG 3/4 BI-LAMINATE (DRAPES) ×3 IMPLANT
DRAPE SURG 17X11 SM STRL (DRAPES) ×3 IMPLANT
DRAPE SURG ORHT 6 SPLT 77X108 (DRAPES) ×2 IMPLANT
DRAPE U-SHAPE 47X51 STRL (DRAPES) ×3 IMPLANT
DRESSING AQUACEL AG SP 3.5X6 (GAUZE/BANDAGES/DRESSINGS) ×1 IMPLANT
DRSG AQUACEL AG ADV 3.5X10 (GAUZE/BANDAGES/DRESSINGS) ×3 IMPLANT
DRSG AQUACEL AG SP 3.5X6 (GAUZE/BANDAGES/DRESSINGS) ×3
DURAPREP 26ML APPLICATOR (WOUND CARE) ×3 IMPLANT
ELECT BLADE TIP CTD 4 INCH (ELECTRODE) ×3 IMPLANT
ELECT REM PT RETURN 15FT ADLT (MISCELLANEOUS) ×3 IMPLANT
FACESHIELD WRAPAROUND (MASK) ×12 IMPLANT
GLENOID UNI REV MOD 24 +2 LAT (Joint) ×3 IMPLANT
GLENOSPHERE 36 +4 LAT/24 (Joint) ×3 IMPLANT
GLOVE BIO SURGEON STRL SZ7.5 (GLOVE) ×3 IMPLANT
GLOVE BIO SURGEON STRL SZ8 (GLOVE) ×3 IMPLANT
GLOVE SS BIOGEL STRL SZ 7 (GLOVE) ×1 IMPLANT
GLOVE SS BIOGEL STRL SZ 7.5 (GLOVE) ×1 IMPLANT
GLOVE SUPERSENSE BIOGEL SZ 7 (GLOVE) ×2
GLOVE SUPERSENSE BIOGEL SZ 7.5 (GLOVE) ×2
GLOVE SURG SYN 7.0 (GLOVE) IMPLANT
GLOVE SURG SYN 7.5  E (GLOVE)
GLOVE SURG SYN 7.5 E (GLOVE) IMPLANT
GLOVE SURG SYN 8.0 (GLOVE) IMPLANT
GOWN STRL REUS W/TWL LRG LVL3 (GOWN DISPOSABLE) ×6 IMPLANT
INSERT HUMERAL 36 +6 (Shoulder) ×3 IMPLANT
KIT BASIN OR (CUSTOM PROCEDURE TRAY) ×3 IMPLANT
KIT TURNOVER KIT A (KITS) IMPLANT
MANIFOLD NEPTUNE II (INSTRUMENTS) ×3 IMPLANT
NEEDLE TAPERED W/ NITINOL LOOP (MISCELLANEOUS) ×3 IMPLANT
NS IRRIG 1000ML POUR BTL (IV SOLUTION) ×3 IMPLANT
PACK SHOULDER (CUSTOM PROCEDURE TRAY) ×3 IMPLANT
PAD ARMBOARD 7.5X6 YLW CONV (MISCELLANEOUS) ×3 IMPLANT
PAD COLD SHLDR WRAP-ON (PAD) IMPLANT
PIN NITINOL TARGETER 2.8 (PIN) IMPLANT
PIN SET MODULAR GLENOID SYSTEM (PIN) ×3 IMPLANT
RESTRAINT HEAD UNIVERSAL NS (MISCELLANEOUS) ×3 IMPLANT
SCREW CENTRAL MODULAR 25 (Screw) ×3 IMPLANT
SCREW PERIPHERAL 5.5X20 LOCK (Screw) ×12 IMPLANT
SLING ARM FOAM STRAP LRG (SOFTGOODS) ×3 IMPLANT
SLING ARM FOAM STRAP MED (SOFTGOODS) ×3 IMPLANT
SPONGE LAP 18X18 RF (DISPOSABLE) IMPLANT
STEM HUMERAL UNIVER REV SIZE 7 (Stem) ×3 IMPLANT
SUCTION FRAZIER HANDLE 12FR (TUBING) ×3
SUCTION TUBE FRAZIER 12FR DISP (TUBING) ×1 IMPLANT
SUT FIBERWIRE #2 38 T-5 BLUE (SUTURE)
SUT MNCRL AB 3-0 PS2 18 (SUTURE) ×3 IMPLANT
SUT MON AB 2-0 CT1 36 (SUTURE) ×3 IMPLANT
SUT VIC AB 1 CT1 36 (SUTURE) ×3 IMPLANT
SUTURE FIBERWR #2 38 T-5 BLUE (SUTURE) IMPLANT
SUTURE TAPE 1.3 40 TPR END (SUTURE) ×2 IMPLANT
SUTURETAPE 1.3 40 TPR END (SUTURE) ×6
TOWEL OR 17X26 10 PK STRL BLUE (TOWEL DISPOSABLE) ×3 IMPLANT
TOWEL OR NON WOVEN STRL DISP B (DISPOSABLE) ×3 IMPLANT
WATER STERILE IRR 1000ML POUR (IV SOLUTION) ×6 IMPLANT

## 2020-06-28 NOTE — Op Note (Signed)
06/28/2020  11:23 AM  PATIENT:   Janet Boyer  79 y.o. female  PRE-OPERATIVE DIAGNOSIS:  Left shoulder rotator cuff tear arthropathy  POST-OPERATIVE DIAGNOSIS: Same  PROCEDURE: Left shoulder reverse arthroplasty utilizing a press-fit size 7 Arthrex stem with a +6 polyethylene insert on a neutral metaphysis, 36/+4 glenosphere on a small/+2 baseplate  SURGEON:  Thayne Cindric, Metta Clines M.D.  ASSISTANTS: Jenetta Loges, PA-C  ANESTHESIA:   General endotracheal and interscalene block with Exparel  EBL: 150 cc  SPECIMEN: None  Drains: None   PATIENT DISPOSITION:  PACU - hemodynamically stable.    PLAN OF CARE: Discharge to home after PACU  Brief history:  Patient is a 79 year old female initially status post a fall sustaining a displaced left shoulder greater tuberosity fracture.  We have performed an open reduction and internal fixation earlier this year and unfortunately she went on to catastrophic failure of the rotator cuff.  She now presents for anticipated reverse shoulder arthroplasty due to severe ongoing pain and associated functional imitations.  Preoperatively, I counseled the patient regarding treatment options and risks versus benefits thereof.  Possible surgical complications were all reviewed including potential for bleeding, infection, neurovascular injury, persistent pain, loss of motion, anesthetic complication, failure of the implant, and possible need for additional surgery. They understand and accept and agrees with our planned procedure.   Procedure in detail:  After undergoing routine preop evaluation the patient received prophylactic antibiotics and interscalene block with Exparel was I was in the holding area by the anesthesia department.  Patient was subsequently placed supine on the operating table and underwent the smooth induction of a general endotracheal anesthesia.  Placed into the beachchair position and appropriately padded and protected.  The left  shoulder girdle region was sterilely prepped and draped in standard fashion.  Timeout was called.  An anterior deltopectoral approach was made to an approximate 10 cm incision.  Skin flaps were elevated dissection carried deeply and the deltopectoral interval was then developed from proximal to distal with the vein taken laterally.  There are areas of varicosities involving the distal cephalic vein necessitating a ligation at that level.  There were multiple dense adhesions beneath the deltoid particularly in relation to the previous lateral deltoid splitting incision and these were all divided and electrocautery was used for hemostasis.  There was significant scarring and a difficult exposure.  The conjoined tendon was then mobilized and retracted medially.  The long head biceps tendon was then tenodesed to the upper border the pectoralis major tendon the proximal segment was then unroofed and excised.  We then separated the subscapularis from the lesser tuberosity and tagged the margin with a pair of suture tape sutures.  Multiple adhesions superiorly about the base of the coracoid were carefully divided and excised with some remnants of the superior rotator cuff excised as well.  Capsular attachments from the anterior and inferior margins of the humeral neck were then divided in a subperiosteal fashion allowing deliver the humeral head through the wound.  We outlined our proposed humeral head resection utilizing the extra medullary guide at the native retroversion of approximate 20 degrees.  This was then completed utilizing an oscillating saw.  A number of retained suture anchors were then visualized and removed.  A metal cap was then placed over the cut proximal humeral surface and distal point we then proceeded with exposure of the glenoid using appropriate retractors.  Circumferential glenoid visualization was achieved and a circumferential labral resection was performed gaining complete visualization.  A  guidepin was then directed into the center of the humeral head and approximate 10 degree inferior tilt and the glenoid was then reamed with central followed by peripheral reamers down to a stable subchondral bony bed.  Glenoid was then prepared with the central drill and tapped and a 25 mm lag screw was selected.  The baseplate was then assembled coated with vancomycin powder and inserted with excellent fit and fixation.  The peripheral locking screws were all then drilled and placed again with the threads coated with vancomycin powder and excellent fit and fixation was achieved with all screws.  A 36/+4 glenosphere was then impacted onto the baseplate and the central locking screw was then placed.  We then returned our attention to the proximal humerus where the canal was opened by hand reaming and additional retained suture anchors were identified and removed.  We then broached up to a size 7 at the native retroversion of 20 degrees.  Good fit and fixation was achieved.  The metaphysis was then prepared with a neutral reaming guide.  A trial reduction was then performed showing good soft tissue balance good fit and fixation.  Trial was then removed the final implant was assembled on the back table and was subsequently coated with vancomycin powder and powder was introduced into the humeral metaphysis canal and the implant was then impacted with excellent fit and fixation.  A series of trial reductions was then performed and ultimately a +6 polyethylene insert gave [place] the best soft tissue balance.  The trial was removed the final +6 polywas impacted and final reduction was then performed.  We are very pleased with the overall motion stability and soft tissue balance.  Copious irrigation was then completed.  Hemostasis was obtained.  The subscapularis was mobilized and repaired back to the eyelets on the collar of the implant.  The arm easily achieved 45 degrees of external rotation without excessive tension on the  subscap repair.  The deltopectoral interval was then reapproximated with a series of figure-of-eight number Vicryl sutures.  2-0 Vicryl used for subcu layer and intracuticular 3-0 Monocryl for the skin followed by Dermabond and Aquacel dressing.  Left arm was placed in a sling and the patient was awakened, extubated, and taken to the recovery room in stable condition.  Jenetta Loges, PA-C was utilized as an Environmental consultant throughout this case, essential for help with positioning the patient, positioning extremity, tissue manipulation, implantation of the prosthesis, suture management, wound closure, and intraoperative decision-making.  Marin Shutter MD   Contact # [phone]

## 2020-06-28 NOTE — Progress Notes (Signed)
Occupational Therapy Evaluation  s/p shoulder replacement without functional use of left non- dominant upper extremity secondary to effects of surgery and interscalene block and shoulder precautions. Therapist provided education and instruction to patient and spouse in regards to exercises, precautions, positioning, donning upper extremity clothing and bathing while maintaining shoulder precautions, ice and edema management and donning/doffing sling. Patient and spouse verbalized understanding and demonstrated as needed. Patient needed assistance to donn shirt, underwear, pants, socks and shoes and provided with instruction on compensatory strategies to perform ADLs. Patient to follow up with MD for further therapy needs.     06/28/20 1400  OT Visit Information  Last OT Received On 06/28/20  Assistance Needed +1  History of Present Illness Patient is a 79 year old female with history of ORIF for displaced left shoulder greater tuberosity fracture that progressed to a painful rotator cuff tear arthropathy. Patient admitted 8/19 for L reverse total shoulder arthroplasty.   Precautions  Precautions Shoulder  Type of Shoulder Precautions AROM elbow, wrist and hand ok, P/AA/AROM for self care sh FF 60, ABD 45, ER 20, pendulums and lap slides ok  Shoulder Interventions Shoulder sling/immobilizer;Off for dressing/bathing/exercises (off in controlled setting)  Precaution Booklet Issued Yes (comment)  Required Braces or Orthoses Sling  Restrictions  Weight Bearing Restrictions Yes  LUE Weight Bearing NWB  Home Living  Family/patient expects to be discharged to: Private residence  Living Arrangements Spouse/significant other  Available Help at Discharge Family;Available 24 hours/day  Type of Home House  Home Access Stairs to enter  Entrance Stairs-Number of Steps threshold step  Home Layout One level  Bathroom Shower/Tub Tub/shower unit;Walk-in shower  Bathroom Toilet Handicapped height  Home  Equipment None  Prior Function  Level of Independence Independent  Communication  Communication No difficulties  Pain Assessment  Pain Assessment No/denies pain  Cognition  Arousal/Alertness Awake/alert  Behavior During Therapy WFL for tasks assessed/performed  Overall Cognitive Status Within Functional Limits for tasks assessed  Upper Extremity Assessment  Upper Extremity Assessment LUE deficits/detail  LUE Deficits / Details + nerve block  Lower Extremity Assessment  Lower Extremity Assessment Overall WFL for tasks assessed  ADL  Overall ADL's  Needs assistance/impaired  Eating/Feeding Set up;Sitting  Grooming Set up;Sitting  Upper Body Bathing Minimal assistance;Sitting  Lower Body Bathing Minimal assistance;Sit to/from stand;Sitting/lateral leans  Upper Body Dressing  Moderate assistance;Cueing for UE precautions;Standing;Cueing for sequencing;Cueing for compensatory techniques  Lower Body Dressing Minimal assistance;Sitting/lateral leans;Sit to/from stand;Cueing for compensatory techniques;Cueing for sequencing  Toilet Transfer Supervision/safety;Ambulation  Toilet Transfer Details (indicate cue type and reason) functional ambulation in room  Toileting- Clothing Manipulation and Hygiene Supervision/safety;Sitting/lateral lean;Sit to/from stand;Cueing for compensatory techniques  Toileting - Clothing Manipulation Details (indicate cue type and reason) pull up over hips  Functional mobility during ADLs Supervision/safety  Bed Mobility  General bed mobility comments in recliner  Transfers  Overall transfer level Needs assistance  Equipment used None  Transfers Sit to/from Stand  Sit to Stand Supervision  General transfer comment no physical assist  Balance  Overall balance assessment Mild deficits observed, not formally tested  Exercises  Exercises Shoulder  Shoulder Instructions  Donning/doffing shirt without moving shoulder Moderate assistance;Patient able to  independently direct caregiver  Method for sponge bathing under operated UE Minimal assistance;Patient able to independently direct caregiver  Donning/doffing sling/immobilizer Maximal assistance;Patient able to independently direct caregiver  Correct positioning of sling/immobilizer Minimal assistance;Patient able to independently direct caregiver  Pendulum exercises (written home exercise program) Patient able to independently  direct caregiver  ROM for elbow, wrist and digits of operated UE Patient able to independently direct caregiver  Sling wearing schedule (on at all times/off for ADL's) Patient able to independently direct caregiver  Proper positioning of operated UE when showering Patient able to independently direct caregiver  Positioning of UE while sleeping Patient able to independently direct caregiver  OT - End of Session  Equipment Utilized During Treatment  (sling)  Activity Tolerance Patient tolerated treatment well  Patient left in chair;with call bell/phone within reach;with family/visitor present  Nurse Communication Mobility status  OT Assessment  OT Recommendation/Assessment Progress rehab of shoulder as ordered by MD at follow-up appointment  OT Visit Diagnosis Pain  Pain - Right/Left Left  Pain - part of body Shoulder  OT Problem List Pain;Impaired UE functional use  AM-PAC OT "6 Clicks" Daily Activity Outcome Measure (Version 2)  Help from another person eating meals? 3  Help from another person taking care of personal grooming? 3  Help from another person toileting, which includes using toliet, bedpan, or urinal? 3  Help from another person bathing (including washing, rinsing, drying)? 3  Help from another person to put on and taking off regular upper body clothing? 2  Help from another person to put on and taking off regular lower body clothing? 3  6 Click Score 17  OT Recommendation  Follow Up Recommendations Follow surgeon's recommendation for DC plan and  follow-up therapies  OT Equipment None recommended by OT  Acute Rehab OT Goals  Patient Stated Goal go home  OT Goal Formulation With patient  OT Time Calculation  OT Start Time (ACUTE ONLY) 1339  OT Stop Time (ACUTE ONLY) 1418  OT Time Calculation (min) 39 min  OT General Charges  $OT Visit 1 Visit  OT Evaluation  $OT Eval Low Complexity 1 Low  OT Treatments  $Self Care/Home Management  23-37 mins  Written Expression  Dominant Hand Right   Delbert Phenix OT OT pager: [phone]

## 2020-06-28 NOTE — H&P (Signed)
Janet Boyer    Chief Complaint: Left shoulder rotator cuff tear arthropathy HPI: The patient is a 79 y.o. female status post ORIF of a displaced left shoulder greater tuberosity fracture who has unfortunately progressed now to a painful rotator cuff tear arthropathy with increasing functional imitations.  Due to her her progressively increasing pain and functional mentation she is brought to the operating room this time for planned left shoulder reverse arthroplasty.    Past Medical History:  Diagnosis Date   Anxiety    Arthritis    Chronic back pain    Colon polyps    Depression    H/O urinary tract infection    as a young woman   High cholesterol    Hyperglycemia    Hyperlipidemia    Hypertension    Hyponatremia    Pain of left lower extremity    Trigeminal neuralgia    Weight gain     Past Surgical History:  Procedure Laterality Date   ABDOMINAL EXPLORATION SURGERY     as a 79 year old   APPENDECTOMY     COLONOSCOPY     MAXIMUM ACCESS (MAS)POSTERIOR LUMBAR INTERBODY FUSION (PLIF) 1 LEVEL N/A 12/30/2013   Procedure: FOR MAXIMUM ACCESS (MAS) POSTERIOR LUMBAR INTERBODY FUSION LUMBAR FOUR-FIVE;  Surgeon: Eustace Moore, MD;  Location: [facility];  Service: Neurosurgery;  Laterality: N/A;  FOR MAXIMUM ACCESS (MAS) POSTERIOR LUMBAR INTERBODY FUSION LUMBAR FOUR-FIVE   TONSILLECTOMY      Family History  Problem Relation Age of Onset   Dementia Mother    Thyroid disease Mother    Cancer Sister     Social History:  reports that she has never smoked. She has never used smokeless tobacco. She reports current alcohol use of about 7.0 standard drinks of alcohol per week. She reports that she does not use drugs.   Medications Prior to Admission  Medication Sig Dispense Refill   carbamazepine (TEGRETOL) 200 MG tablet Take 50-100 mg by mouth 2 (two) times daily as needed (for trigeminal neuralgia). Take 1/4 tablet in the morning and 1/2 tablet in the  evening.     LORazepam (ATIVAN) 1 MG tablet Take 0.5 mg by mouth daily. Pt takes in the am     lovastatin (MEVACOR) 40 MG tablet Take 40 mg by mouth daily. Pt takes after eats a meal in the am     metoprolol succinate (TOPROL-XL) 50 MG 24 hr tablet Take 50 mg by mouth daily. Take with or immediately following a meal.     omeprazole (PRILOSEC) 40 MG capsule Take 40 mg by mouth daily as needed (reflux).      triamterene-hydrochlorothiazide (MAXZIDE-25) 37.5-25 MG per tablet Take 0.5 tablets by mouth daily. Takes in the am       Physical Exam: Inspection of the left shoulder demonstrates a well-healed lateral deltoid splitting incision.  There is no erythema or induration.  She demonstrates a severely painful and guarded range of motion as noted at her recent office visits.  Plain radiographs confirm sequela consistent with previous ORIF of the tuberosity fracture with a high riding humeral head.  Vitals  Temp:  [98.1 F (36.7 C)] 98.1 F (36.7 C) (08/19 0800) Pulse Rate:  [60-68] 68 (08/19 0905) Resp:  [14-19] 17 (08/19 0905) BP: (157-180)/(62-74) 164/65 (08/19 0905) SpO2:  [100 %] 100 % (08/19 0905) Weight:  [63.3 kg] 63.3 kg (08/19 0808)  Assessment/Plan  Impression: Left shoulder rotator cuff tear arthropathy  Plan of Action: Procedure(s):  REVERSE SHOULDER ARTHROPLASTY  Delmore Sear M Lis Savitt 06/28/2020, 9:08 AM Contact # [phone]

## 2020-06-28 NOTE — Anesthesia Procedure Notes (Signed)
Procedure Name: Intubation Date/Time: 06/28/2020 9:58 AM Performed by: Niel Hummer, CRNA Pre-anesthesia Checklist: Patient identified, Emergency Drugs available, Suction available and Patient being monitored Patient Re-evaluated:Patient Re-evaluated prior to induction Oxygen Delivery Method: Circle system utilized Preoxygenation: Pre-oxygenation with 100% oxygen Induction Type: IV induction Ventilation: Mask ventilation without difficulty Laryngoscope Size: Mac and 4 Grade View: Grade I Tube type: Oral Tube size: 7.0 mm Number of attempts: 1 Airway Equipment and Method: Stylet Placement Confirmation: ETT inserted through vocal cords under direct vision,  positive ETCO2 and breath sounds checked- equal and bilateral Secured at: 22 cm Tube secured with: Tape Dental Injury: Teeth and Oropharynx as per pre-operative assessment

## 2020-06-28 NOTE — Anesthesia Postprocedure Evaluation (Signed)
Anesthesia Post Note  Patient: Janet Boyer  Procedure(s) Performed: REVERSE SHOULDER ARTHROPLASTY (Left Shoulder)     Patient location during evaluation: PACU Anesthesia Type: General Level of consciousness: awake and alert Pain management: pain level controlled Vital Signs Assessment: post-procedure vital signs reviewed and stable Respiratory status: spontaneous breathing, nonlabored ventilation, respiratory function stable and patient connected to nasal cannula oxygen Cardiovascular status: blood pressure returned to baseline and stable Postop Assessment: no apparent nausea or vomiting Anesthetic complications: no   No complications documented.  Last Vitals:  Vitals:   06/28/20 1300 06/28/20 1315  BP: (!) 159/77 (!) 162/77  Pulse: 69 66  Resp: 16 19  Temp:  (!) 36.1 C  SpO2: 95% 98%    Last Pain:  Vitals:   06/28/20 1315  TempSrc:   PainSc: 0-No pain                 Barnet Glasgow

## 2020-06-28 NOTE — Discharge Instructions (Signed)
 Kevin M. Supple, M.D., F.A.A.O.S. Orthopaedic Surgery Specializing in Arthroscopic and Reconstructive Surgery of the Shoulder [phone] [address] - [place], [place] [address] - Fax [phone]   POST-OP TOTAL SHOULDER REPLACEMENT INSTRUCTIONS  1. Follow up in the office for your first post-op appointment 10-14 days from the date of your surgery. If you do not already have a scheduled appointment, our office will contact you to schedule.  2. The bandage over your incision is waterproof. You may begin showering with this dressing on. You may leave this dressing on until first follow up appointment within 2 weeks. We prefer you leave this dressing in place until follow up however after 5-7 days if you are having itching or skin irritation and would like to remove it you may do so. Go slow and tug at the borders gently to break the bond the dressing has with the skin. At this point if there is no drainage it is okay to go without a bandage or you may cover it with a light guaze and tape. You can also expect significant bruising around your shoulder that will drift down your arm and into your chest wall. This is very normal and should resolve over several days.   3. Wear your sling/immobilizer at all times except to perform the exercises below or to occasionally let your arm dangle by your side to stretch your elbow. You also need to sleep in your sling immobilizer until instructed otherwise. It is ok to remove your sling if you are sitting in a controlled environment and allow your arm to rest in a position of comfort by your side or on your lap with pillows to give your neck and skin a break from the sling. You may remove it to allow arm to dangle by side to shower. If you are up walking around and when you go to sleep at night you need to wear it.  4. Range of motion to your elbow, wrist, and hand are encouraged 3-5 times daily. Exercise to your hand and fingers helps to reduce  swelling you may experience.   5. Prescriptions for a pain medication and a muscle relaxant are provided for you. It is recommended that if you are experiencing pain that you pain medication alone is not controlling, add the muscle relaxant along with the pain medication which can give additional pain relief. The first 1-2 days is generally the most severe of your pain and then should gradually decrease. As your pain lessens it is recommended that you decrease your use of the pain medications to an "as needed basis'" only and to always comply with the recommended dosages of the pain medications.  6. Pain medications can produce constipation along with their use. If you experience this, the use of an over the counter stool softener or laxative daily is recommended.   7. For additional questions or concerns, please do not hesitate to call the office. If after hours there is an answering service to forward your concerns to the physician on call.  8.Pain control following an exparel block  To help control your post-operative pain you received a nerve block  performed with Exparel which is a long acting anesthetic (numbing agent) which can provide pain relief and sensations of numbness (and relief of pain) in the operative shoulder and arm for up to 3 days. Sometimes it provides mixed relief, meaning you may still have numbness in certain areas of the arm but can still be able to   move  parts of that arm, hand, and fingers. We recommend that your prescribed pain medications  be used as needed. We do not feel it is necessary to "pre medicate" and "stay ahead" of pain.  Taking narcotic pain medications when you are not having any pain can lead to unnecessary and potentially dangerous side effects.    9. Use the ice machine as much as possible in the first 5-7 days from surgery, then you can wean its use to as needed. The ice typically needs to be replaced every 6 hours, instead of ice you can actually freeze  water bottles to put in the cooler and then fill water around them to avoid having to purchase ice. You can have spare water bottles freezing to allow you to rotate them once they have melted. Try to have a thin shirt or light cloth or towel under the ice wrap to protect your skin.   10.  We recommend that you avoid any dental work or cleaning in the first 3 months following your joint replacement. This is to help minimize the possibility of infection from the bacteria in your mouth that enters your bloodstream during dental work. We also recommend that you take an antibiotic prior to your dental work for the first year after your shoulder replacement to further help reduce that risk. Please simply contact our office for antibiotics to be sent to your pharmacy prior to dental work.  11. Dental Antibiotics:  In most cases prophylactic antibiotics for Dental procdeures after total joint surgery are not necessary.  Exceptions are as follows:  1. History of prior total joint infection  2. Severely immunocompromised (Organ Transplant, cancer chemotherapy, Rheumatoid biologic meds such as Humera)  3. Poorly controlled diabetes (A1C &gt; 8.0, blood glucose over 200)  If you have one of these conditions, contact your surgeon for an antibiotic prescription, prior to your dental procedure.   POST-OP EXERCISES  Pendulum Exercises  Perform pendulum exercises while standing and bending at the waist. Support your uninvolved arm on a table or chair and allow your operated arm to hang freely. Make sure to do these exercises passively - not using you shoulder muscles. These exercises can be performed once your nerve block effects have worn off.  Repeat 20 times. Do 3 sessions per day.     

## 2020-06-28 NOTE — Anesthesia Procedure Notes (Signed)
Anesthesia Regional Block: Interscalene brachial plexus block   Pre-Anesthetic Checklist: ,, timeout performed, Correct Patient, Correct Site, Correct Laterality, Correct Procedure, Correct Position, site marked, Risks and benefits discussed,  Surgical consent,  Pre-op evaluation,  At surgeon's request and post-op pain management  Laterality: Upper and Left  Prep: Maximum Sterile Barrier Precautions used, chloraprep       Needles:  Injection technique: Single-shot  Needle Type: Echogenic Needle     Needle Length: 5cm  Needle Gauge: 21     Additional Needles:   Procedures:,,,, ultrasound used (permanent image in chart),,,,  Narrative:  Start time: 06/28/2020 8:50 AM End time: 06/28/2020 8:58 AM Injection made incrementally with aspirations every 5 mL.  Performed by: Personally  Anesthesiologist: Barnet Glasgow, MD  Additional Notes: Block assessed prior to procedure. Patient tolerated procedure well.

## 2020-06-28 NOTE — Transfer of Care (Signed)
Immediate Anesthesia Transfer of Care Note  Patient: Janet Boyer  Procedure(s) Performed: REVERSE SHOULDER ARTHROPLASTY (Left Shoulder)  Patient Location: PACU  Anesthesia Type:General  Level of Consciousness: awake, alert  and oriented  Airway & Oxygen Therapy: Patient Spontanous Breathing and Patient connected to face mask oxygen  Post-op Assessment: Report given to RN and Post -op Vital signs reviewed and stable  Post vital signs: Reviewed and stable  Last Vitals:  Vitals Value Taken Time  BP    Temp    Pulse 68 06/28/20 1149  Resp 23 06/28/20 1149  SpO2 100 % 06/28/20 1149  Vitals shown include unvalidated device data.  Last Pain:  Vitals:   06/28/20 0808  TempSrc:   PainSc: 5       Patients Stated Pain Goal: 4 (02/54/86 2824)  Complications: No complications documented.

## 2020-06-28 NOTE — Progress Notes (Signed)
Assisted Dr. Andres Shad with Left Interscalene Brachial Plexus block. Side rails up, monitors on throughout procedure. See vital signs in flow sheet. Tolerated Procedure well.

## 2020-07-02 ENCOUNTER — Encounter (HOSPITAL_COMMUNITY): Payer: Self-pay | Admitting: Orthopedic Surgery

## 2020-07-09 DIAGNOSIS — Z471 Aftercare following joint replacement surgery: Secondary | ICD-10-CM | POA: Diagnosis not present

## 2020-07-09 DIAGNOSIS — Z96612 Presence of left artificial shoulder joint: Secondary | ICD-10-CM | POA: Diagnosis not present

## 2020-07-17 DIAGNOSIS — M25612 Stiffness of left shoulder, not elsewhere classified: Secondary | ICD-10-CM | POA: Diagnosis not present

## 2020-07-17 DIAGNOSIS — M25512 Pain in left shoulder: Secondary | ICD-10-CM | POA: Diagnosis not present

## 2020-07-23 DIAGNOSIS — M25512 Pain in left shoulder: Secondary | ICD-10-CM | POA: Diagnosis not present

## 2020-07-23 DIAGNOSIS — M25612 Stiffness of left shoulder, not elsewhere classified: Secondary | ICD-10-CM | POA: Diagnosis not present

## 2020-07-26 DIAGNOSIS — F418 Other specified anxiety disorders: Secondary | ICD-10-CM | POA: Diagnosis not present

## 2020-07-26 DIAGNOSIS — R739 Hyperglycemia, unspecified: Secondary | ICD-10-CM | POA: Diagnosis not present

## 2020-07-26 DIAGNOSIS — H9193 Unspecified hearing loss, bilateral: Secondary | ICD-10-CM | POA: Diagnosis not present

## 2020-07-26 DIAGNOSIS — Z23 Encounter for immunization: Secondary | ICD-10-CM | POA: Diagnosis not present

## 2020-07-26 DIAGNOSIS — G500 Trigeminal neuralgia: Secondary | ICD-10-CM | POA: Diagnosis not present

## 2020-07-26 DIAGNOSIS — E785 Hyperlipidemia, unspecified: Secondary | ICD-10-CM | POA: Diagnosis not present

## 2020-07-26 DIAGNOSIS — M545 Low back pain: Secondary | ICD-10-CM | POA: Diagnosis not present

## 2020-07-26 DIAGNOSIS — E871 Hypo-osmolality and hyponatremia: Secondary | ICD-10-CM | POA: Diagnosis not present

## 2020-07-26 DIAGNOSIS — I10 Essential (primary) hypertension: Secondary | ICD-10-CM | POA: Diagnosis not present

## 2020-07-30 DIAGNOSIS — M25612 Stiffness of left shoulder, not elsewhere classified: Secondary | ICD-10-CM | POA: Diagnosis not present

## 2020-07-30 DIAGNOSIS — M25512 Pain in left shoulder: Secondary | ICD-10-CM | POA: Diagnosis not present

## 2020-08-02 DIAGNOSIS — M25512 Pain in left shoulder: Secondary | ICD-10-CM | POA: Diagnosis not present

## 2020-08-02 DIAGNOSIS — M25612 Stiffness of left shoulder, not elsewhere classified: Secondary | ICD-10-CM | POA: Diagnosis not present

## 2020-08-09 DIAGNOSIS — M25612 Stiffness of left shoulder, not elsewhere classified: Secondary | ICD-10-CM | POA: Diagnosis not present

## 2020-08-09 DIAGNOSIS — M25512 Pain in left shoulder: Secondary | ICD-10-CM | POA: Diagnosis not present

## 2020-08-16 DIAGNOSIS — M25612 Stiffness of left shoulder, not elsewhere classified: Secondary | ICD-10-CM | POA: Diagnosis not present

## 2020-08-16 DIAGNOSIS — M25512 Pain in left shoulder: Secondary | ICD-10-CM | POA: Diagnosis not present

## 2020-08-23 DIAGNOSIS — M25512 Pain in left shoulder: Secondary | ICD-10-CM | POA: Diagnosis not present

## 2020-08-23 DIAGNOSIS — M25612 Stiffness of left shoulder, not elsewhere classified: Secondary | ICD-10-CM | POA: Diagnosis not present

## 2020-09-03 DIAGNOSIS — M25612 Stiffness of left shoulder, not elsewhere classified: Secondary | ICD-10-CM | POA: Diagnosis not present

## 2020-09-03 DIAGNOSIS — M25512 Pain in left shoulder: Secondary | ICD-10-CM | POA: Diagnosis not present

## 2020-09-06 DIAGNOSIS — M25612 Stiffness of left shoulder, not elsewhere classified: Secondary | ICD-10-CM | POA: Diagnosis not present

## 2020-09-06 DIAGNOSIS — M25512 Pain in left shoulder: Secondary | ICD-10-CM | POA: Diagnosis not present

## 2020-09-10 DIAGNOSIS — Z4789 Encounter for other orthopedic aftercare: Secondary | ICD-10-CM | POA: Diagnosis not present

## 2020-09-10 DIAGNOSIS — S42252D Displaced fracture of greater tuberosity of left humerus, subsequent encounter for fracture with routine healing: Secondary | ICD-10-CM | POA: Diagnosis not present

## 2020-09-20 DIAGNOSIS — M25512 Pain in left shoulder: Secondary | ICD-10-CM | POA: Diagnosis not present

## 2020-09-20 DIAGNOSIS — M25612 Stiffness of left shoulder, not elsewhere classified: Secondary | ICD-10-CM | POA: Diagnosis not present

## 2020-09-27 DIAGNOSIS — M25512 Pain in left shoulder: Secondary | ICD-10-CM | POA: Diagnosis not present

## 2020-09-27 DIAGNOSIS — M25612 Stiffness of left shoulder, not elsewhere classified: Secondary | ICD-10-CM | POA: Diagnosis not present

## 2020-10-08 DIAGNOSIS — M25512 Pain in left shoulder: Secondary | ICD-10-CM | POA: Diagnosis not present

## 2020-10-08 DIAGNOSIS — M25612 Stiffness of left shoulder, not elsewhere classified: Secondary | ICD-10-CM | POA: Diagnosis not present

## 2020-12-24 DIAGNOSIS — H26492 Other secondary cataract, left eye: Secondary | ICD-10-CM | POA: Diagnosis not present

## 2020-12-24 DIAGNOSIS — Z961 Presence of intraocular lens: Secondary | ICD-10-CM | POA: Diagnosis not present

## 2020-12-31 DIAGNOSIS — Z96612 Presence of left artificial shoulder joint: Secondary | ICD-10-CM | POA: Diagnosis not present

## 2021-01-17 DIAGNOSIS — R739 Hyperglycemia, unspecified: Secondary | ICD-10-CM | POA: Diagnosis not present

## 2021-01-17 DIAGNOSIS — E785 Hyperlipidemia, unspecified: Secondary | ICD-10-CM | POA: Diagnosis not present

## 2021-01-24 DIAGNOSIS — I10 Essential (primary) hypertension: Secondary | ICD-10-CM | POA: Diagnosis not present

## 2021-01-24 DIAGNOSIS — E785 Hyperlipidemia, unspecified: Secondary | ICD-10-CM | POA: Diagnosis not present

## 2021-01-24 DIAGNOSIS — M79644 Pain in right finger(s): Secondary | ICD-10-CM | POA: Diagnosis not present

## 2021-01-24 DIAGNOSIS — H9193 Unspecified hearing loss, bilateral: Secondary | ICD-10-CM | POA: Diagnosis not present

## 2021-01-24 DIAGNOSIS — Z1331 Encounter for screening for depression: Secondary | ICD-10-CM | POA: Diagnosis not present

## 2021-01-24 DIAGNOSIS — Z0000 Encounter for general adult medical examination without abnormal findings: Secondary | ICD-10-CM | POA: Diagnosis not present

## 2021-01-24 DIAGNOSIS — R739 Hyperglycemia, unspecified: Secondary | ICD-10-CM | POA: Diagnosis not present

## 2021-01-24 DIAGNOSIS — H6123 Impacted cerumen, bilateral: Secondary | ICD-10-CM | POA: Diagnosis not present

## 2021-01-24 DIAGNOSIS — G500 Trigeminal neuralgia: Secondary | ICD-10-CM | POA: Diagnosis not present

## 2021-01-24 DIAGNOSIS — E871 Hypo-osmolality and hyponatremia: Secondary | ICD-10-CM | POA: Diagnosis not present

## 2021-01-24 DIAGNOSIS — R634 Abnormal weight loss: Secondary | ICD-10-CM | POA: Diagnosis not present

## 2021-01-24 DIAGNOSIS — R82998 Other abnormal findings in urine: Secondary | ICD-10-CM | POA: Diagnosis not present

## 2021-01-24 DIAGNOSIS — M549 Dorsalgia, unspecified: Secondary | ICD-10-CM | POA: Diagnosis not present

## 2021-01-24 DIAGNOSIS — Z1339 Encounter for screening examination for other mental health and behavioral disorders: Secondary | ICD-10-CM | POA: Diagnosis not present

## 2021-02-27 DIAGNOSIS — J069 Acute upper respiratory infection, unspecified: Secondary | ICD-10-CM | POA: Diagnosis not present

## 2021-02-27 DIAGNOSIS — R0981 Nasal congestion: Secondary | ICD-10-CM | POA: Diagnosis not present

## 2021-02-27 DIAGNOSIS — Z1152 Encounter for screening for COVID-19: Secondary | ICD-10-CM | POA: Diagnosis not present

## 2021-02-27 DIAGNOSIS — R519 Headache, unspecified: Secondary | ICD-10-CM | POA: Diagnosis not present

## 2021-03-05 DIAGNOSIS — H6123 Impacted cerumen, bilateral: Secondary | ICD-10-CM | POA: Diagnosis not present

## 2022-08-14 DIAGNOSIS — Z23 Encounter for immunization: Secondary | ICD-10-CM | POA: Diagnosis not present

## 2023-01-13 DIAGNOSIS — H26492 Other secondary cataract, left eye: Secondary | ICD-10-CM | POA: Diagnosis not present

## 2023-01-13 DIAGNOSIS — Z961 Presence of intraocular lens: Secondary | ICD-10-CM | POA: Diagnosis not present

## 2023-05-13 DIAGNOSIS — E785 Hyperlipidemia, unspecified: Secondary | ICD-10-CM | POA: Diagnosis not present

## 2023-05-13 DIAGNOSIS — R739 Hyperglycemia, unspecified: Secondary | ICD-10-CM | POA: Diagnosis not present

## 2023-05-13 DIAGNOSIS — I10 Essential (primary) hypertension: Secondary | ICD-10-CM | POA: Diagnosis not present

## 2023-05-13 DIAGNOSIS — E871 Hypo-osmolality and hyponatremia: Secondary | ICD-10-CM | POA: Diagnosis not present

## 2023-05-21 DIAGNOSIS — R739 Hyperglycemia, unspecified: Secondary | ICD-10-CM | POA: Diagnosis not present

## 2023-05-21 DIAGNOSIS — R82998 Other abnormal findings in urine: Secondary | ICD-10-CM | POA: Diagnosis not present

## 2023-05-21 DIAGNOSIS — E785 Hyperlipidemia, unspecified: Secondary | ICD-10-CM | POA: Diagnosis not present

## 2023-05-21 DIAGNOSIS — H9193 Unspecified hearing loss, bilateral: Secondary | ICD-10-CM | POA: Diagnosis not present

## 2023-05-21 DIAGNOSIS — G500 Trigeminal neuralgia: Secondary | ICD-10-CM | POA: Diagnosis not present

## 2023-05-21 DIAGNOSIS — Z0000 Encounter for general adult medical examination without abnormal findings: Secondary | ICD-10-CM | POA: Diagnosis not present

## 2023-05-21 DIAGNOSIS — I10 Essential (primary) hypertension: Secondary | ICD-10-CM | POA: Diagnosis not present

## 2023-12-14 ENCOUNTER — Inpatient Hospital Stay (HOSPITAL_COMMUNITY)
Admission: EM | Admit: 2023-12-14 | Discharge: 2023-12-22 | DRG: 481 | Disposition: A | Discharge status: Skilled Nursing Facility | Payer: Medicare PPO | Attending: Internal Medicine | Admitting: Internal Medicine

## 2023-12-14 ENCOUNTER — Other Ambulatory Visit: Payer: Self-pay

## 2023-12-14 ENCOUNTER — Emergency Department (HOSPITAL_COMMUNITY): Payer: Medicare PPO

## 2023-12-14 ENCOUNTER — Encounter (HOSPITAL_COMMUNITY): Payer: Self-pay | Admitting: Emergency Medicine

## 2023-12-14 DIAGNOSIS — G500 Trigeminal neuralgia: Secondary | ICD-10-CM | POA: Diagnosis present

## 2023-12-14 DIAGNOSIS — W010XXA Fall on same level from slipping, tripping and stumbling without subsequent striking against object, initial encounter: Secondary | ICD-10-CM | POA: Diagnosis present

## 2023-12-14 DIAGNOSIS — E876 Hypokalemia: Secondary | ICD-10-CM | POA: Diagnosis present

## 2023-12-14 DIAGNOSIS — Z8349 Family history of other endocrine, nutritional and metabolic diseases: Secondary | ICD-10-CM | POA: Diagnosis not present

## 2023-12-14 DIAGNOSIS — Z809 Family history of malignant neoplasm, unspecified: Secondary | ICD-10-CM

## 2023-12-14 DIAGNOSIS — Z981 Arthrodesis status: Secondary | ICD-10-CM

## 2023-12-14 DIAGNOSIS — R41841 Cognitive communication deficit: Secondary | ICD-10-CM | POA: Diagnosis not present

## 2023-12-14 DIAGNOSIS — F32A Depression, unspecified: Secondary | ICD-10-CM | POA: Diagnosis present

## 2023-12-14 DIAGNOSIS — S72141D Displaced intertrochanteric fracture of right femur, subsequent encounter for closed fracture with routine healing: Secondary | ICD-10-CM | POA: Diagnosis not present

## 2023-12-14 DIAGNOSIS — Z87440 Personal history of urinary (tract) infections: Secondary | ICD-10-CM | POA: Diagnosis not present

## 2023-12-14 DIAGNOSIS — S72001A Fracture of unspecified part of neck of right femur, initial encounter for closed fracture: Secondary | ICD-10-CM | POA: Diagnosis not present

## 2023-12-14 DIAGNOSIS — R739 Hyperglycemia, unspecified: Secondary | ICD-10-CM | POA: Diagnosis present

## 2023-12-14 DIAGNOSIS — F29 Unspecified psychosis not due to a substance or known physiological condition: Secondary | ICD-10-CM | POA: Diagnosis not present

## 2023-12-14 DIAGNOSIS — S72141A Displaced intertrochanteric fracture of right femur, initial encounter for closed fracture: Principal | ICD-10-CM | POA: Diagnosis present

## 2023-12-14 DIAGNOSIS — D72829 Elevated white blood cell count, unspecified: Secondary | ICD-10-CM | POA: Diagnosis present

## 2023-12-14 DIAGNOSIS — W19XXXA Unspecified fall, initial encounter: Secondary | ICD-10-CM | POA: Diagnosis not present

## 2023-12-14 DIAGNOSIS — M160 Bilateral primary osteoarthritis of hip: Secondary | ICD-10-CM | POA: Diagnosis not present

## 2023-12-14 DIAGNOSIS — Z9889 Other specified postprocedural states: Secondary | ICD-10-CM | POA: Diagnosis not present

## 2023-12-14 DIAGNOSIS — M1990 Unspecified osteoarthritis, unspecified site: Secondary | ICD-10-CM | POA: Diagnosis present

## 2023-12-14 DIAGNOSIS — R2689 Other abnormalities of gait and mobility: Secondary | ICD-10-CM | POA: Diagnosis not present

## 2023-12-14 DIAGNOSIS — I959 Hypotension, unspecified: Secondary | ICD-10-CM | POA: Diagnosis present

## 2023-12-14 DIAGNOSIS — E7800 Pure hypercholesterolemia, unspecified: Secondary | ICD-10-CM | POA: Diagnosis present

## 2023-12-14 DIAGNOSIS — Y92009 Unspecified place in unspecified non-institutional (private) residence as the place of occurrence of the external cause: Secondary | ICD-10-CM | POA: Diagnosis not present

## 2023-12-14 DIAGNOSIS — M549 Dorsalgia, unspecified: Secondary | ICD-10-CM | POA: Diagnosis present

## 2023-12-14 DIAGNOSIS — Z820 Family history of epilepsy and other diseases of the nervous system: Secondary | ICD-10-CM

## 2023-12-14 DIAGNOSIS — Z91018 Allergy to other foods: Secondary | ICD-10-CM

## 2023-12-14 DIAGNOSIS — Z9089 Acquired absence of other organs: Secondary | ICD-10-CM | POA: Diagnosis not present

## 2023-12-14 DIAGNOSIS — Z860100 Personal history of colon polyps, unspecified: Secondary | ICD-10-CM | POA: Diagnosis not present

## 2023-12-14 DIAGNOSIS — I10 Essential (primary) hypertension: Secondary | ICD-10-CM | POA: Diagnosis present

## 2023-12-14 DIAGNOSIS — S79911A Unspecified injury of right hip, initial encounter: Secondary | ICD-10-CM | POA: Diagnosis not present

## 2023-12-14 DIAGNOSIS — F419 Anxiety disorder, unspecified: Secondary | ICD-10-CM | POA: Diagnosis present

## 2023-12-14 DIAGNOSIS — M25551 Pain in right hip: Secondary | ICD-10-CM | POA: Diagnosis not present

## 2023-12-14 DIAGNOSIS — Z96612 Presence of left artificial shoulder joint: Secondary | ICD-10-CM | POA: Diagnosis present

## 2023-12-14 DIAGNOSIS — G8929 Other chronic pain: Secondary | ICD-10-CM | POA: Diagnosis present

## 2023-12-14 DIAGNOSIS — Z9049 Acquired absence of other specified parts of digestive tract: Secondary | ICD-10-CM | POA: Diagnosis not present

## 2023-12-14 DIAGNOSIS — M6281 Muscle weakness (generalized): Secondary | ICD-10-CM | POA: Diagnosis not present

## 2023-12-14 DIAGNOSIS — Z7401 Bed confinement status: Secondary | ICD-10-CM | POA: Diagnosis not present

## 2023-12-14 DIAGNOSIS — F418 Other specified anxiety disorders: Secondary | ICD-10-CM | POA: Diagnosis not present

## 2023-12-14 DIAGNOSIS — D638 Anemia in other chronic diseases classified elsewhere: Secondary | ICD-10-CM | POA: Diagnosis present

## 2023-12-14 DIAGNOSIS — S72351A Displaced comminuted fracture of shaft of right femur, initial encounter for closed fracture: Secondary | ICD-10-CM | POA: Diagnosis not present

## 2023-12-14 DIAGNOSIS — E871 Hypo-osmolality and hyponatremia: Secondary | ICD-10-CM | POA: Diagnosis present

## 2023-12-14 DIAGNOSIS — Z79899 Other long term (current) drug therapy: Secondary | ICD-10-CM

## 2023-12-14 DIAGNOSIS — S72121A Displaced fracture of lesser trochanter of right femur, initial encounter for closed fracture: Secondary | ICD-10-CM | POA: Diagnosis not present

## 2023-12-14 MED ORDER — CARMEX CLASSIC LIP BALM EX OINT
TOPICAL_OINTMENT | Freq: Once | CUTANEOUS | Status: AC
  Filled 2023-12-14: qty 10

## 2023-12-14 NOTE — ED Provider Notes (Signed)
[place] EMERGENCY DEPARTMENT AT [facility] Provider Note   CSN: [id number] Arrival date & time: 12/14/23  2251     History {Add pertinent medical, surgical, social history, OB history to HPI:1} Chief Complaint  Patient presents with   Fall   Hip Injury    Janet Boyer is a 83 y.o. female.  83 year old female with a history of hypertension, hyperlipidemia, hyperglycemia, trigeminal neuralgia, chronic back pain presents to the emergency department after mechanical fall at home.  Patient states that she stumbled and fell landing on her right hip.  She had no head trauma or loss of consciousness.  Reports no other complaints of pain.  The patient is not on chronic anticoagulation.  Has not ambulated since the incident.    Has a history of prior right shoulder surgery 2 years ago with [facility].  The history is provided by the patient and the spouse. No language interpreter was used.  Fall       Home Medications Prior to Admission medications   Medication Sig Start Date End Date Taking? Authorizing Provider  carbamazepine (TEGRETOL) 200 MG tablet Take 50-100 mg by mouth 2 (two) times daily as needed (for trigeminal neuralgia). Take 1/4 tablet in the morning and 1/2 tablet in the evening. 11/14/13   [provider]  cyclobenzaprine (FLEXERIL) 10 MG tablet Take 1 tablet (10 mg total) by mouth 3 (three) times daily as needed for muscle spasms. 06/28/20   Shuford, French Ana, PA-C  LORazepam (ATIVAN) 1 MG tablet Take 0.5 mg by mouth daily. Pt takes in the am    [provider]  lovastatin (MEVACOR) 40 MG tablet Take 40 mg by mouth daily. Pt takes after eats a meal in the am    [provider]  metoprolol succinate (TOPROL-XL) 50 MG 24 hr tablet Take 50 mg by mouth daily. Take with or immediately following a meal.    [provider]  naproxen (NAPROSYN) 500 MG tablet Take 1 tablet (500 mg total) by mouth 2 (two) times daily with a meal.  06/28/20   Shuford, French Ana, PA-C  omeprazole (PRILOSEC) 40 MG capsule Take 40 mg by mouth daily as needed (reflux).     [provider]  ondansetron (ZOFRAN) 4 MG tablet Take 1 tablet (4 mg total) by mouth every 8 (eight) hours as needed for nausea or vomiting. 06/28/20   Shuford, French Ana, PA-C  oxyCODONE-acetaminophen (PERCOCET) 5-325 MG tablet Take 1 tablet by mouth every 4 (four) hours as needed (max 6 q). 06/28/20   Shuford, French Ana, PA-C  triamterene-hydrochlorothiazide (MAXZIDE-25) 37.5-25 MG per tablet Take 0.5 tablets by mouth daily. Takes in the am    [provider]      Allergies    Other    Review of Systems   Review of Systems Ten systems reviewed and are negative for acute change, except as noted in the HPI.    Physical Exam Updated Vital Signs BP (!) 116/57 (BP Location: Left Arm)   Pulse (!) 55   Temp 98 F (36.7 C) (Oral)   Resp 18   SpO2 94%   Physical Exam Vitals and nursing note reviewed.  Constitutional:      General: She is not in acute distress.    Appearance: She is well-developed. She is not diaphoretic.     Comments: Nontoxic appearing, pleasant.  HENT:     Head: Normocephalic and atraumatic.  Eyes:     General: No scleral icterus.    Conjunctiva/sclera: Conjunctivae  normal.  Cardiovascular:     Rate and Rhythm: Normal rate and regular rhythm.     Pulses: Normal pulses.     Comments: DP pulse 2+ in the right lower extremity Pulmonary:     Effort: Pulmonary effort is normal. No respiratory distress.     Comments: Respirations even and unlabored Musculoskeletal:        General: Normal range of motion.     Cervical back: Normal range of motion.     Comments: Tenderness to palpation of the right hip without crepitus.  There is right lower extremity shortening compared to the left.  Finding suspicious for hip fracture.  Skin:    General: Skin is warm and dry.     Coloration: Skin is not pale.     Findings: No erythema or rash.      Comments: No hematoma or ecchymosis noted to the right hip or pelvic region.  Neurological:     Mental Status: She is alert and oriented to person, place, and time.     Coordination: Coordination normal.     Comments: Sensation to light touch intact in bilateral lower extremities.  Psychiatric:        Behavior: Behavior normal.     ED Results / Procedures / Treatments   Labs (all labs ordered are listed, but only abnormal results are displayed) Labs Reviewed  CBC WITH DIFFERENTIAL/PLATELET  BASIC METABOLIC PANEL    EKG None  Radiology No results found.  Procedures Procedures  {Document cardiac monitor, telemetry assessment procedure when appropriate:1}  Medications Ordered in ED Medications  lip balm (CARMEX) ointment (has no administration in time range)    ED Course/ Medical Decision Making/ A&P   {   Click here for ABCD2, HEART and other calculatorsREFRESH Note before signing :1}                              Medical Decision Making Amount and/or Complexity of Data Reviewed Labs: ordered. Radiology: ordered.  Risk OTC drugs.   ***  {Document critical care time when appropriate:1} {Document review of labs and clinical decision tools ie heart score, Chads2Vasc2 etc:1}  {Document your independent review of radiology images, and any outside records:1} {Document your discussion with family members, caretakers, and with consultants:1} {Document social determinants of health affecting pt's care:1} {Document your decision making why or why not admission, treatments were needed:1} Final Clinical Impression(s) / ED Diagnoses Final diagnoses:  None    Rx / DC Orders ED Discharge Orders     None

## 2023-12-14 NOTE — ED Triage Notes (Signed)
Pt arrives via EMS co right hip pain w/ deformity after a mechanical fall at home. Pt denied LOC and blood thinner, denies hitting head.

## 2023-12-15 ENCOUNTER — Inpatient Hospital Stay (HOSPITAL_COMMUNITY): Payer: Medicare PPO | Admitting: Anesthesiology

## 2023-12-15 ENCOUNTER — Encounter (HOSPITAL_COMMUNITY): Payer: Self-pay | Admitting: Family Medicine

## 2023-12-15 ENCOUNTER — Inpatient Hospital Stay: Admit: 2023-12-15 | Payer: Medicare PPO | Admitting: Orthopedic Surgery

## 2023-12-15 ENCOUNTER — Inpatient Hospital Stay (HOSPITAL_COMMUNITY): Payer: Medicare PPO

## 2023-12-15 ENCOUNTER — Encounter (HOSPITAL_COMMUNITY)
Admission: EM | Disposition: A | Discharge status: Skilled Nursing Facility | Payer: Self-pay | Source: Home / Self Care | Attending: Internal Medicine

## 2023-12-15 DIAGNOSIS — G8929 Other chronic pain: Secondary | ICD-10-CM | POA: Diagnosis present

## 2023-12-15 DIAGNOSIS — Z9049 Acquired absence of other specified parts of digestive tract: Secondary | ICD-10-CM | POA: Diagnosis not present

## 2023-12-15 DIAGNOSIS — I10 Essential (primary) hypertension: Secondary | ICD-10-CM | POA: Diagnosis present

## 2023-12-15 DIAGNOSIS — M549 Dorsalgia, unspecified: Secondary | ICD-10-CM | POA: Diagnosis present

## 2023-12-15 DIAGNOSIS — E7800 Pure hypercholesterolemia, unspecified: Secondary | ICD-10-CM | POA: Diagnosis present

## 2023-12-15 DIAGNOSIS — W010XXA Fall on same level from slipping, tripping and stumbling without subsequent striking against object, initial encounter: Secondary | ICD-10-CM | POA: Diagnosis present

## 2023-12-15 DIAGNOSIS — E876 Hypokalemia: Secondary | ICD-10-CM | POA: Diagnosis present

## 2023-12-15 DIAGNOSIS — I959 Hypotension, unspecified: Secondary | ICD-10-CM | POA: Diagnosis present

## 2023-12-15 DIAGNOSIS — Z79899 Other long term (current) drug therapy: Secondary | ICD-10-CM | POA: Diagnosis not present

## 2023-12-15 DIAGNOSIS — F418 Other specified anxiety disorders: Secondary | ICD-10-CM

## 2023-12-15 DIAGNOSIS — Z9889 Other specified postprocedural states: Secondary | ICD-10-CM | POA: Diagnosis not present

## 2023-12-15 DIAGNOSIS — S72001A Fracture of unspecified part of neck of right femur, initial encounter for closed fracture: Secondary | ICD-10-CM | POA: Diagnosis not present

## 2023-12-15 DIAGNOSIS — Y92009 Unspecified place in unspecified non-institutional (private) residence as the place of occurrence of the external cause: Secondary | ICD-10-CM | POA: Diagnosis not present

## 2023-12-15 DIAGNOSIS — Z96612 Presence of left artificial shoulder joint: Secondary | ICD-10-CM | POA: Diagnosis present

## 2023-12-15 DIAGNOSIS — Z9089 Acquired absence of other organs: Secondary | ICD-10-CM | POA: Diagnosis not present

## 2023-12-15 DIAGNOSIS — D638 Anemia in other chronic diseases classified elsewhere: Secondary | ICD-10-CM | POA: Diagnosis present

## 2023-12-15 DIAGNOSIS — Z87440 Personal history of urinary (tract) infections: Secondary | ICD-10-CM | POA: Diagnosis not present

## 2023-12-15 DIAGNOSIS — S72141A Displaced intertrochanteric fracture of right femur, initial encounter for closed fracture: Secondary | ICD-10-CM

## 2023-12-15 DIAGNOSIS — S72351A Displaced comminuted fracture of shaft of right femur, initial encounter for closed fracture: Secondary | ICD-10-CM | POA: Diagnosis not present

## 2023-12-15 DIAGNOSIS — G500 Trigeminal neuralgia: Secondary | ICD-10-CM | POA: Diagnosis present

## 2023-12-15 DIAGNOSIS — F32A Depression, unspecified: Secondary | ICD-10-CM | POA: Diagnosis present

## 2023-12-15 DIAGNOSIS — R739 Hyperglycemia, unspecified: Secondary | ICD-10-CM | POA: Diagnosis present

## 2023-12-15 DIAGNOSIS — D72829 Elevated white blood cell count, unspecified: Secondary | ICD-10-CM | POA: Diagnosis present

## 2023-12-15 DIAGNOSIS — Z8349 Family history of other endocrine, nutritional and metabolic diseases: Secondary | ICD-10-CM | POA: Diagnosis not present

## 2023-12-15 DIAGNOSIS — E871 Hypo-osmolality and hyponatremia: Secondary | ICD-10-CM | POA: Diagnosis present

## 2023-12-15 DIAGNOSIS — Z860100 Personal history of colon polyps, unspecified: Secondary | ICD-10-CM | POA: Diagnosis not present

## 2023-12-15 DIAGNOSIS — F419 Anxiety disorder, unspecified: Secondary | ICD-10-CM | POA: Diagnosis present

## 2023-12-15 DIAGNOSIS — Z981 Arthrodesis status: Secondary | ICD-10-CM | POA: Diagnosis not present

## 2023-12-15 DIAGNOSIS — M1990 Unspecified osteoarthritis, unspecified site: Secondary | ICD-10-CM | POA: Diagnosis present

## 2023-12-15 HISTORY — PX: INTRAMEDULLARY (IM) NAIL INTERTROCHANTERIC: SHX5875

## 2023-12-15 LAB — CBC WITH DIFFERENTIAL/PLATELET
Abs Immature Granulocytes: 0.06 10*3/uL (ref 0.00–0.07)
Basophils Absolute: 0.1 10*3/uL (ref 0.0–0.1)
Basophils Relative: 1 %
Eosinophils Absolute: 0.1 10*3/uL (ref 0.0–0.5)
Eosinophils Relative: 1 %
HCT: 32.3 % — ABNORMAL LOW (ref 36.0–46.0)
Hemoglobin: 10.9 g/dL — ABNORMAL LOW (ref 12.0–15.0)
Immature Granulocytes: 0 %
Lymphocytes Relative: 10 %
Lymphs Abs: 1.4 10*3/uL (ref 0.7–4.0)
MCH: 32.7 pg (ref 26.0–34.0)
MCHC: 33.7 g/dL (ref 30.0–36.0)
MCV: 97 fL (ref 80.0–100.0)
Monocytes Absolute: 0.9 10*3/uL (ref 0.1–1.0)
Monocytes Relative: 7 %
Neutro Abs: 11.1 10*3/uL — ABNORMAL HIGH (ref 1.7–7.7)
Neutrophils Relative %: 81 %
Platelets: 208 10*3/uL (ref 150–400)
RBC: 3.33 MIL/uL — ABNORMAL LOW (ref 3.87–5.11)
RDW: 12.9 % (ref 11.5–15.5)
WBC: 13.6 10*3/uL — ABNORMAL HIGH (ref 4.0–10.5)
nRBC: 0 % (ref 0.0–0.2)

## 2023-12-15 LAB — CBC
HCT: 30.8 % — ABNORMAL LOW (ref 36.0–46.0)
Hemoglobin: 10.4 g/dL — ABNORMAL LOW (ref 12.0–15.0)
MCH: 32.8 pg (ref 26.0–34.0)
MCHC: 33.8 g/dL (ref 30.0–36.0)
MCV: 97.2 fL (ref 80.0–100.0)
Platelets: 185 10*3/uL (ref 150–400)
RBC: 3.17 MIL/uL — ABNORMAL LOW (ref 3.87–5.11)
RDW: 13.1 % (ref 11.5–15.5)
WBC: 8.8 10*3/uL (ref 4.0–10.5)
nRBC: 0 % (ref 0.0–0.2)

## 2023-12-15 LAB — BASIC METABOLIC PANEL
Anion gap: 11 (ref 5–15)
Anion gap: 7 (ref 5–15)
BUN: 14 mg/dL (ref 8–23)
BUN: 14 mg/dL (ref 8–23)
CO2: 19 mmol/L — ABNORMAL LOW (ref 22–32)
CO2: 22 mmol/L (ref 22–32)
Calcium: 7.8 mg/dL — ABNORMAL LOW (ref 8.9–10.3)
Calcium: 7.9 mg/dL — ABNORMAL LOW (ref 8.9–10.3)
Chloride: 97 mmol/L — ABNORMAL LOW (ref 98–111)
Chloride: 98 mmol/L (ref 98–111)
Creatinine, Ser: 0.46 mg/dL (ref 0.44–1.00)
Creatinine, Ser: 0.68 mg/dL (ref 0.44–1.00)
GFR, Estimated: >60 mL/min (ref >60)
GFR, Estimated: >60 mL/min (ref >60)
Glucose, Bld: 128 mg/dL — ABNORMAL HIGH (ref 70–99)
Glucose, Bld: 142 mg/dL — ABNORMAL HIGH (ref 70–99)
Potassium: 3.4 mmol/L — ABNORMAL LOW (ref 3.5–5.1)
Potassium: 3.7 mmol/L (ref 3.5–5.1)
Sodium: 126 mmol/L — ABNORMAL LOW (ref 135–145)
Sodium: 128 mmol/L — ABNORMAL LOW (ref 135–145)

## 2023-12-15 LAB — MAGNESIUM: Magnesium: 1.8 mg/dL (ref 1.7–2.4)

## 2023-12-15 LAB — SURGICAL PCR SCREEN
MRSA, PCR: NEGATIVE
Staphylococcus aureus: NEGATIVE

## 2023-12-15 SURGERY — FIXATION, FRACTURE, INTERTROCHANTERIC, WITH INTRAMEDULLARY ROD
Anesthesia: General | Laterality: Right

## 2023-12-15 MED ORDER — OXYCODONE HCL 5 MG PO TABS
10.0000 mg | ORAL_TABLET | ORAL | Status: DC | PRN
  Administered 2023-12-16 – 2023-12-22 (×22): 10 mg via ORAL
  Filled 2023-12-15 (×22): qty 2

## 2023-12-15 MED ORDER — PROPOFOL 10 MG/ML IV BOLUS
INTRAVENOUS | Status: DC | PRN
  Administered 2023-12-15: 120 mg via INTRAVENOUS

## 2023-12-15 MED ORDER — BISACODYL 5 MG PO TBEC
5.0000 mg | DELAYED_RELEASE_TABLET | Freq: Every day | ORAL | Status: DC | PRN
  Administered 2023-12-18 – 2023-12-19 (×2): 5 mg via ORAL
  Filled 2023-12-15 (×2): qty 1

## 2023-12-15 MED ORDER — PROPOFOL 10 MG/ML IV BOLUS
INTRAVENOUS | Status: AC
  Filled 2023-12-15: qty 20

## 2023-12-15 MED ORDER — POLYETHYLENE GLYCOL 3350 17 G PO PACK
17.0000 g | PACK | Freq: Every day | ORAL | Status: DC | PRN
  Administered 2023-12-17: 17 g via ORAL
  Filled 2023-12-15: qty 1

## 2023-12-15 MED ORDER — SUGAMMADEX SODIUM 200 MG/2ML IV SOLN
INTRAVENOUS | Status: DC | PRN
  Administered 2023-12-15: 100 mg via INTRAVENOUS

## 2023-12-15 MED ORDER — ACETAMINOPHEN 500 MG PO TABS
1000.0000 mg | ORAL_TABLET | Freq: Four times a day (QID) | ORAL | Status: AC
  Administered 2023-12-15 – 2023-12-16 (×4): 1000 mg via ORAL
  Filled 2023-12-15 (×4): qty 2

## 2023-12-15 MED ORDER — HYDROMORPHONE HCL 1 MG/ML IJ SOLN
1.0000 mg | Freq: Once | INTRAMUSCULAR | Status: AC
  Administered 2023-12-15: 1 mg via INTRAVENOUS
  Filled 2023-12-15: qty 1

## 2023-12-15 MED ORDER — PHENOL 1.4 % MT LIQD: 1.0000 | OROMUCOSAL | Status: DC | PRN

## 2023-12-15 MED ORDER — ONDANSETRON HCL 4 MG PO TABS: 4.0000 mg | ORAL_TABLET | Freq: Four times a day (QID) | ORAL | Status: DC | PRN

## 2023-12-15 MED ORDER — DOCUSATE SODIUM 100 MG PO CAPS
100.0000 mg | ORAL_CAPSULE | Freq: Two times a day (BID) | ORAL | Status: DC
  Administered 2023-12-15 – 2023-12-21 (×13): 100 mg via ORAL
  Filled 2023-12-15 (×14): qty 1

## 2023-12-15 MED ORDER — FENTANYL CITRATE (PF) 100 MCG/2ML IJ SOLN
INTRAMUSCULAR | Status: DC | PRN
  Administered 2023-12-15: 100 ug via INTRAVENOUS

## 2023-12-15 MED ORDER — ONDANSETRON HCL 4 MG/2ML IJ SOLN
4.0000 mg | Freq: Once | INTRAMUSCULAR | Status: AC | PRN
  Administered 2023-12-15: 4 mg via INTRAVENOUS
  Filled 2023-12-15: qty 2

## 2023-12-15 MED ORDER — PROCHLORPERAZINE EDISYLATE 10 MG/2ML IJ SOLN: 5.0000 mg | INTRAMUSCULAR | Status: DC | PRN

## 2023-12-15 MED ORDER — PRAVASTATIN SODIUM 20 MG PO TABS
40.0000 mg | ORAL_TABLET | Freq: Every day | ORAL | Status: DC
  Administered 2023-12-15 – 2023-12-21 (×7): 40 mg via ORAL
  Filled 2023-12-15 (×7): qty 2

## 2023-12-15 MED ORDER — OXYCODONE HCL 5 MG PO TABS
ORAL_TABLET | ORAL | Status: AC
  Filled 2023-12-15: qty 1

## 2023-12-15 MED ORDER — METOCLOPRAMIDE HCL 5 MG/ML IJ SOLN
5.0000 mg | Freq: Three times a day (TID) | INTRAMUSCULAR | Status: DC | PRN
Start: 2023-12-15 — End: 2023-12-22

## 2023-12-15 MED ORDER — CEFAZOLIN SODIUM-DEXTROSE 2-4 GM/100ML-% IV SOLN
2.0000 g | Freq: Four times a day (QID) | INTRAVENOUS | Status: AC
  Administered 2023-12-15 – 2023-12-16 (×2): 2 g via INTRAVENOUS
  Filled 2023-12-15 (×2): qty 100

## 2023-12-15 MED ORDER — CARBAMAZEPINE 100 MG PO CHEW: 100.0000 mg | CHEWABLE_TABLET | Freq: Every evening | ORAL | Status: DC | PRN

## 2023-12-15 MED ORDER — OXYCODONE HCL 5 MG PO TABS
5.0000 mg | ORAL_TABLET | ORAL | Status: DC | PRN
Start: 2023-12-15 — End: 2023-12-15

## 2023-12-15 MED ORDER — DROPERIDOL 2.5 MG/ML IJ SOLN: 0.6250 mg | Freq: Once | INTRAMUSCULAR | Status: DC | PRN

## 2023-12-15 MED ORDER — METOCLOPRAMIDE HCL 5 MG PO TABS: 5.0000 mg | ORAL_TABLET | Freq: Three times a day (TID) | ORAL | Status: DC | PRN

## 2023-12-15 MED ORDER — POVIDONE-IODINE 10 % EX SWAB
2.0000 | Freq: Once | CUTANEOUS | Status: AC
  Administered 2023-12-15: 2 via TOPICAL

## 2023-12-15 MED ORDER — LIDOCAINE 2% (20 MG/ML) 5 ML SYRINGE
INTRAMUSCULAR | Status: DC | PRN
  Administered 2023-12-15: 60 mg via INTRAVENOUS

## 2023-12-15 MED ORDER — HYDROMORPHONE HCL 1 MG/ML IJ SOLN: 0.2500 mg | INTRAMUSCULAR | Status: DC | PRN

## 2023-12-15 MED ORDER — METHOCARBAMOL 500 MG PO TABS
ORAL_TABLET | ORAL | Status: AC
  Filled 2023-12-15: qty 1

## 2023-12-15 MED ORDER — ONDANSETRON HCL 4 MG/2ML IJ SOLN
4.0000 mg | Freq: Four times a day (QID) | INTRAMUSCULAR | Status: DC | PRN
  Administered 2023-12-16: 4 mg via INTRAVENOUS
  Filled 2023-12-15: qty 2

## 2023-12-15 MED ORDER — POTASSIUM CHLORIDE IN NACL 20-0.9 MEQ/L-% IV SOLN
INTRAVENOUS | Status: DC
  Filled 2023-12-15: qty 1000

## 2023-12-15 MED ORDER — TRANEXAMIC ACID-NACL 1000-0.7 MG/100ML-% IV SOLN
1000.0000 mg | INTRAVENOUS | Status: AC
  Administered 2023-12-15: 1000 mg via INTRAVENOUS
  Filled 2023-12-15: qty 100

## 2023-12-15 MED ORDER — FENTANYL CITRATE (PF) 100 MCG/2ML IJ SOLN
INTRAMUSCULAR | Status: AC
  Filled 2023-12-15: qty 2

## 2023-12-15 MED ORDER — METHOCARBAMOL 500 MG PO TABS
500.0000 mg | ORAL_TABLET | Freq: Four times a day (QID) | ORAL | Status: DC | PRN
  Administered 2023-12-15 – 2023-12-22 (×8): 500 mg via ORAL
  Filled 2023-12-15 (×8): qty 1

## 2023-12-15 MED ORDER — ONDANSETRON HCL 4 MG/2ML IJ SOLN
INTRAMUSCULAR | Status: AC
  Filled 2023-12-15: qty 2

## 2023-12-15 MED ORDER — ONDANSETRON HCL 4 MG/2ML IJ SOLN: 4.0000 mg | Freq: Once | INTRAMUSCULAR | Status: DC

## 2023-12-15 MED ORDER — CEFAZOLIN SODIUM-DEXTROSE 2-4 GM/100ML-% IV SOLN
2.0000 g | INTRAVENOUS | Status: AC
  Administered 2023-12-15: 2 g via INTRAVENOUS
  Filled 2023-12-15 (×2): qty 100

## 2023-12-15 MED ORDER — METOPROLOL SUCCINATE ER 50 MG PO TB24
50.0000 mg | ORAL_TABLET | Freq: Every day | ORAL | Status: DC
  Administered 2023-12-15 – 2023-12-22 (×8): 50 mg via ORAL
  Filled 2023-12-15 (×8): qty 1

## 2023-12-15 MED ORDER — HYDROMORPHONE HCL 1 MG/ML IJ SOLN
0.5000 mg | INTRAMUSCULAR | Status: DC | PRN
  Administered 2023-12-15 – 2023-12-16 (×3): 1 mg via INTRAVENOUS
  Filled 2023-12-15 (×3): qty 1

## 2023-12-15 MED ORDER — ROCURONIUM BROMIDE 10 MG/ML (PF) SYRINGE
PREFILLED_SYRINGE | INTRAVENOUS | Status: DC | PRN
  Administered 2023-12-15: 50 mg via INTRAVENOUS

## 2023-12-15 MED ORDER — PANTOPRAZOLE SODIUM 40 MG PO TBEC
40.0000 mg | DELAYED_RELEASE_TABLET | Freq: Every day | ORAL | Status: DC
  Administered 2023-12-16 – 2023-12-22 (×7): 40 mg via ORAL
  Filled 2023-12-15 (×7): qty 1

## 2023-12-15 MED ORDER — DEXAMETHASONE SODIUM PHOSPHATE 4 MG/ML IJ SOLN
INTRAMUSCULAR | Status: DC | PRN
  Administered 2023-12-15: 10 mg via INTRAVENOUS

## 2023-12-15 MED ORDER — MENTHOL 3 MG MT LOZG: 1.0000 | LOZENGE | OROMUCOSAL | Status: DC | PRN

## 2023-12-15 MED ORDER — CARBAMAZEPINE 200 MG PO TABS: 200.0000 mg | ORAL_TABLET | Freq: Two times a day (BID) | ORAL | Status: DC | PRN

## 2023-12-15 MED ORDER — DEXAMETHASONE SODIUM PHOSPHATE 10 MG/ML IJ SOLN
INTRAMUSCULAR | Status: AC
  Filled 2023-12-15: qty 1

## 2023-12-15 MED ORDER — CHLORHEXIDINE GLUCONATE 4 % EX SOLN
60.0000 mL | Freq: Once | CUTANEOUS | Status: AC
  Administered 2023-12-15: 4 via TOPICAL

## 2023-12-15 MED ORDER — TRANEXAMIC ACID-NACL 1000-0.7 MG/100ML-% IV SOLN
1000.0000 mg | Freq: Once | INTRAVENOUS | Status: AC
  Administered 2023-12-15: 1000 mg via INTRAVENOUS
  Filled 2023-12-15: qty 100

## 2023-12-15 MED ORDER — FENTANYL CITRATE PF 50 MCG/ML IJ SOSY
50.0000 ug | PREFILLED_SYRINGE | Freq: Once | INTRAMUSCULAR | Status: AC
  Administered 2023-12-15: 50 ug via INTRAVENOUS

## 2023-12-15 MED ORDER — METHOCARBAMOL 1000 MG/10ML IJ SOLN: 500.0000 mg | Freq: Four times a day (QID) | INTRAMUSCULAR | Status: DC | PRN

## 2023-12-15 MED ORDER — ACETAMINOPHEN 325 MG PO TABS
325.0000 mg | ORAL_TABLET | Freq: Four times a day (QID) | ORAL | Status: DC | PRN
Start: 2023-12-16 — End: 2023-12-22
  Administered 2023-12-17 – 2023-12-21 (×3): 650 mg via ORAL
  Filled 2023-12-15 (×3): qty 2

## 2023-12-15 MED ORDER — ONDANSETRON HCL 4 MG/2ML IJ SOLN
INTRAMUSCULAR | Status: DC | PRN
  Administered 2023-12-15: 4 mg via INTRAVENOUS

## 2023-12-15 MED ORDER — CARBAMAZEPINE 100 MG PO CHEW: 50.0000 mg | CHEWABLE_TABLET | Freq: Every day | ORAL | Status: DC | PRN

## 2023-12-15 MED ORDER — FENTANYL CITRATE PF 50 MCG/ML IJ SOSY
50.0000 ug | PREFILLED_SYRINGE | Freq: Once | INTRAMUSCULAR | Status: AC
  Administered 2023-12-15: 50 ug via INTRAVENOUS
  Filled 2023-12-15: qty 1

## 2023-12-15 MED ORDER — LACTATED RINGERS IV SOLN: INTRAVENOUS | Status: AC

## 2023-12-15 MED ORDER — KCL IN DEXTROSE-NACL 20-5-0.45 MEQ/L-%-% IV SOLN: INTRAVENOUS | Status: DC

## 2023-12-15 MED ORDER — FENTANYL CITRATE PF 50 MCG/ML IJ SOSY
12.5000 ug | PREFILLED_SYRINGE | INTRAMUSCULAR | Status: DC | PRN
  Administered 2023-12-15 (×3): 50 ug via INTRAVENOUS
  Filled 2023-12-15 (×4): qty 1

## 2023-12-15 MED ORDER — ALUM & MAG HYDROXIDE-SIMETH 200-200-20 MG/5ML PO SUSP: 30.0000 mL | ORAL | Status: DC | PRN

## 2023-12-15 MED ORDER — OXYCODONE HCL 5 MG PO TABS
5.0000 mg | ORAL_TABLET | ORAL | Status: DC | PRN
Start: 2023-12-15 — End: 2023-12-22
  Administered 2023-12-15: 5 mg via ORAL
  Filled 2023-12-15: qty 1

## 2023-12-15 MED ORDER — ENOXAPARIN SODIUM 40 MG/0.4ML IJ SOSY
40.0000 mg | PREFILLED_SYRINGE | INTRAMUSCULAR | Status: DC
  Administered 2023-12-16 – 2023-12-22 (×7): 40 mg via SUBCUTANEOUS
  Filled 2023-12-15 (×7): qty 0.4

## 2023-12-15 MED ORDER — POTASSIUM CHLORIDE 20 MEQ PO PACK: 20.0000 meq | PACK | Freq: Once | ORAL | Status: DC

## 2023-12-15 MED ORDER — PHENYLEPHRINE HCL-NACL 20-0.9 MG/250ML-% IV SOLN
INTRAVENOUS | Status: DC | PRN
  Administered 2023-12-15: 40 ug/min via INTRAVENOUS

## 2023-12-15 MED ORDER — PHENYLEPHRINE 80 MCG/ML (10ML) SYRINGE FOR IV PUSH (FOR BLOOD PRESSURE SUPPORT)
PREFILLED_SYRINGE | INTRAVENOUS | Status: DC | PRN
  Administered 2023-12-15 (×2): 160 ug via INTRAVENOUS

## 2023-12-15 SURGICAL SUPPLY — 45 items
BAG COUNTER SPONGE SURGICOUNT (BAG) IMPLANT
BAG ZIPLOCK 12X15 (MISCELLANEOUS) ×1 IMPLANT
BIT DRILL AO GAMMA 4.2X340 (BIT) IMPLANT
BNDG COHESIVE 6X5 TAN ST LF (GAUZE/BANDAGES/DRESSINGS) ×1 IMPLANT
BNDG ELASTIC 6INX 5YD STR LF (GAUZE/BANDAGES/DRESSINGS) ×1 IMPLANT
BNDG GAUZE DERMACEA FLUFF 4 (GAUZE/BANDAGES/DRESSINGS) ×1 IMPLANT
CLSR STERI-STRIP ANTIMIC 1/2X4 (GAUZE/BANDAGES/DRESSINGS) IMPLANT
COVER SURGICAL LIGHT HANDLE (MISCELLANEOUS) ×1 IMPLANT
DRAPE INCISE IOBAN 66X45 STRL (DRAPES) ×1 IMPLANT
DRAPE STERI IOBAN 125X83 (DRAPES) ×1 IMPLANT
DRAPE U-SHAPE 47X51 STRL (DRAPES) ×1 IMPLANT
DRESSING MEPILEX FLEX 4X4 (GAUZE/BANDAGES/DRESSINGS) IMPLANT
DRSG AQUACEL AG ADV 3.5X 4 (GAUZE/BANDAGES/DRESSINGS) ×2 IMPLANT
DRSG AQUACEL AG ADV 3.5X 6 (GAUZE/BANDAGES/DRESSINGS) ×1 IMPLANT
DRSG MEPILEX FLEX 4X4 (GAUZE/BANDAGES/DRESSINGS) ×1
DRSG MEPILEX POST OP 4X8 (GAUZE/BANDAGES/DRESSINGS) IMPLANT
DURAPREP 26ML APPLICATOR (WOUND CARE) ×1 IMPLANT
ELECT REM PT RETURN 15FT ADLT (MISCELLANEOUS) ×1 IMPLANT
GAUZE PAD ABD 8X10 STRL (GAUZE/BANDAGES/DRESSINGS) ×2 IMPLANT
GAUZE SPONGE 4X4 12PLY STRL (GAUZE/BANDAGES/DRESSINGS) ×1 IMPLANT
GLOVE BIOGEL PI IND STRL 7.5 (GLOVE) ×1 IMPLANT
GLOVE BIOGEL PI IND STRL 8 (GLOVE) ×1 IMPLANT
GLOVE ECLIPSE 8.0 STRL XLNG CF (GLOVE) IMPLANT
GLOVE ORTHO TXT STRL SZ7.5 (GLOVE) ×2 IMPLANT
GLOVE SURG ORTHO 8.0 STRL STRW (GLOVE) ×1 IMPLANT
GOWN SPEC L3 XXLG W/TWL (GOWN DISPOSABLE) ×2 IMPLANT
GOWN STRL REUS W/ TWL LRG LVL3 (GOWN DISPOSABLE) ×1 IMPLANT
K-WIRE 3.2X450M STR (WIRE) ×1
KIT BASIN OR (CUSTOM PROCEDURE TRAY) ×1 IMPLANT
KIT TURNOVER KIT A (KITS) IMPLANT
KWIRE 3.2X450M STR (WIRE) IMPLANT
MANIFOLD NEPTUNE II (INSTRUMENTS) ×1 IMPLANT
NAIL KIT TROCH 10X170X125 (Nail) IMPLANT
PACK GENERAL/GYN (CUSTOM PROCEDURE TRAY) ×1 IMPLANT
PAD CAST 4YDX4 CTTN HI CHSV (CAST SUPPLIES) ×1 IMPLANT
PROTECTOR NERVE ULNAR (MISCELLANEOUS) ×2 IMPLANT
SCREW LAG GAMMA 3 TI 10.5X85MM (Screw) IMPLANT
SCREW LOCKING T2 F/T 5MMX35MM (Screw) IMPLANT
STAPLER SKIN PROX WIDE 3.9 (STAPLE) ×1 IMPLANT
SUT ETHILON 3 0 PS 1 (SUTURE) IMPLANT
SUT MNCRL AB 4-0 PS2 18 (SUTURE) ×1 IMPLANT
SUT VIC AB 0 CT1 27XBRD ANTBC (SUTURE) ×2 IMPLANT
SUT VIC AB 2-0 CT1 27XBRD (SUTURE) ×2 IMPLANT
SUT VIC AB 2-0 CT1 TAPERPNT 27 (SUTURE) IMPLANT
TOWEL OR 17X26 10 PK STRL BLUE (TOWEL DISPOSABLE) ×2 IMPLANT

## 2023-12-15 NOTE — Interval H&P Note (Signed)
 History and Physical Interval Note:  12/15/2023 12:06 PM  Janet Boyer  has presented today for surgery, with the diagnosis of RIGHT HIP INTERTROCHANTERIC FRACTURE.  The various methods of treatment have been discussed with the patient and family. After consideration of risks, benefits and other options for treatment, the patient has consented to  Procedure(s): RIGHT INTERTROCHANTERIC INTRAMEDULLARY (IM) NAIL (Right) as a surgical intervention.  The patient's history has been reviewed, patient examined, no change in status, stable for surgery.  I have reviewed the patient's chart and labs.  Questions were answered to the patient's satisfaction.     [name]

## 2023-12-15 NOTE — Op Note (Signed)
 DATE OF SURGERY:  12/15/2023  TIME: 1:18 PM  PATIENT NAME:  Janet Boyer  AGE: 83 y.o.  PRE-OPERATIVE DIAGNOSIS:  RIGHT HIP INTERTROCHANTERIC FRACTURE  POST-OPERATIVE DIAGNOSIS:  SAME  PROCEDURE:  RIGHT INTERTROCHANTERIC INTRAMEDULLARY (IM) NAIL  SURGEON:  [name]  ASSISTANT:  Gerard Large, PA-C, she was present and scrubbed throughout the case, critical for completion in a timely fashion, and for retraction, instrumentation, and closure.   OPERATIVE IMPLANTS: Stryker Gamma Nail  PREOPERATIVE INDICATIONS:  [name] is a 83 y.o. year old who fell and suffered a hip fracture. She was brought into the ER and then admitted and optimized and then elected for surgical intervention.    The risks benefits and alternatives were discussed with the patient including but not limited to the risks of nonoperative treatment, versus surgical intervention including infection, bleeding, nerve injury, malunion, nonunion, hardware prominence, hardware failure, need for hardware removal, blood clots, cardiopulmonary complications, morbidity, mortality, among others, and they were willing to proceed.    OPERATIVE PROCEDURE:  The patient was brought to the operating room and placed in the supine position. General anesthesia was administered. She was placed on the fracture table.  Closed reduction was performed under C-arm guidance. Time out was then performed after sterile prep and drape. She received preoperative antibiotics.  I first performed a closed manipulation of the fracture to reduce it on the OR table.  Incision was made proximal to the greater trochanter. A guidewire was placed in the appropriate position. Confirmation was made on AP and lateral views. The above-named nail was opened. I opened the proximal femur with a reamer. I then placed the nail by hand easily down. I did not need to ream the femur.  Once the nail was completely seated, I placed a guidepin into the  femoral head into the center center position. I measured the length, and then reamed the lateral cortex and up into the head. I then placed the lag screw. Slight compression was applied. Anatomic fixation achieved.  I then secured the proximal interlocking bolt, and took off a half a turn, and then removed the instruments, and took final C-arm pictures AP and lateral the entire length of the leg.  I then placed a distal interlock screw.   Anatomic reconstruction was achieved, and the wounds were irrigated copiously and closed with a layered closure. The patient was awakened and returned to PACU in stable and satisfactory condition. There no complications and the patient tolerated the procedure well.  She will be weightbearing as tolerated, and will be on chemical px  for a period of four weeks after discharge.   Evalene Chancy, M.D.

## 2023-12-15 NOTE — Anesthesia Procedure Notes (Signed)
 Procedure Name: Intubation Date/Time: 12/15/2023 1:21 PM  Performed by: Coree Brame, Corean BROCKS, CRNAPre-anesthesia Checklist: Patient identified, Emergency Drugs available, Suction available and Patient being monitored Patient Re-evaluated:Patient Re-evaluated prior to induction Oxygen Delivery Method: Circle system utilized Preoxygenation: Pre-oxygenation with 100% oxygen Induction Type: IV induction Ventilation: Mask ventilation without difficulty Laryngoscope Size: Mac and 3 Grade View: Grade I Tube type: Oral Tube size: 7.0 mm Number of attempts: 1 Airway Equipment and Method: Stylet and Oral airway Placement Confirmation: ETT inserted through vocal cords under direct vision, positive ETCO2 and breath sounds checked- equal and bilateral Secured at: 20 cm Tube secured with: Tape Dental Injury: Teeth and Oropharynx as per pre-operative assessment

## 2023-12-15 NOTE — Progress Notes (Signed)
83 y/o female fell last night and sustained a displaced right hip intertroch fracture.  She will need surgery and is posted for 3 pm this afternoon.  Please keep NPO and hold blood thinners.  Full consult note to follow.

## 2023-12-15 NOTE — H&P (Signed)
 History and Physical    BEV DRENNEN FMW:[id number] DOB: 1941-10-23 DOA: 12/14/2023  PCP: Yolande Toribio MATSU, MD   Patient coming from: Home   Chief Complaint: Fall, right hip pain   HPI: Janet Boyer is a 83 y.o. female with medical history significant for hypertension, hyperlipidemia, depression, anxiety, trigeminal neuralgia, and hyponatremia who presents with right hip pain after a fall at home.  Patient was in her usual state of health last night when she suffered a ground-level mechanical fall.  She had just eaten dinner, stood up, and tripped while trying to ambulate.  She denies hitting her head or losing consciousness.  She was experiencing severe right hip pain immediately and was unable to stand due to this.  She denies any history of heart disease, is typically quite active, and could ascend 2 flights of stairs prior to the fall.  ED Course: Upon arrival to the ED, patient is found to be afebrile and saturating well on room air with stable blood pressure.  Labs are most notable for sodium 128, potassium 3.4, WBC 13,600, hemoglobin 10.9, and normal renal function.  No acute findings seen on chest x-ray.  Plain radiographs of the hip demonstrate acute intertrochanteric fracture of the right femur.  Orthopedic surgery (Dr. Kit) was consulted by the ED PA and the patient was treated with Dilaudid , fentanyl , and Zofran .  Review of Systems:  All other systems reviewed and apart from HPI, are negative.  Past Medical History:  Diagnosis Date   Anxiety    Arthritis    Chronic back pain    Colon polyps    Depression    H/O urinary tract infection    as a young woman   High cholesterol    Hyperglycemia    Hyperlipidemia    Hypertension    Hyponatremia    Pain of left lower extremity    Trigeminal neuralgia    Weight gain     Past Surgical History:  Procedure Laterality Date   ABDOMINAL EXPLORATION SURGERY     as a 83 year old   APPENDECTOMY      COLONOSCOPY     MAXIMUM ACCESS (MAS)POSTERIOR LUMBAR INTERBODY FUSION (PLIF) 1 LEVEL N/A 12/30/2013   Procedure: FOR MAXIMUM ACCESS (MAS) POSTERIOR LUMBAR INTERBODY FUSION LUMBAR FOUR-FIVE;  Surgeon: Alm GORMAN Molt, MD;  Location: [facility];  Service: Neurosurgery;  Laterality: N/A;  FOR MAXIMUM ACCESS (MAS) POSTERIOR LUMBAR INTERBODY FUSION LUMBAR FOUR-FIVE   REVERSE SHOULDER ARTHROPLASTY Left 06/28/2020   Procedure: REVERSE SHOULDER ARTHROPLASTY;  Surgeon: Melita Drivers, MD;  Location: [facility];  Service: Orthopedics;  Laterality: Left;    TONSILLECTOMY      Social History:   reports that she has never smoked. She has never used smokeless tobacco. She reports current alcohol  use of about 7.0 standard drinks of alcohol  per week. She reports that she does not use drugs.  Allergies  Allergen Reactions   Other     Strawberries tomatoes    Family History  Problem Relation Age of Onset   Dementia Mother    Thyroid  disease Mother    Cancer Sister      Prior to Admission medications   Medication Sig Start Date End Date Taking? Authorizing Provider  carbamazepine  (TEGRETOL ) 200 MG tablet Take 50-100 mg by mouth 2 (two) times daily as needed (for trigeminal neuralgia). Take 1/4 tablet in the morning and 1/2 tablet in the evening. 11/14/13  Yes [provider]  lovastatin (MEVACOR) 40 MG  tablet Take 40 mg by mouth daily. Pt takes after eats a meal in the am   Yes [provider]  metoprolol  succinate (TOPROL -XL) 50 MG 24 hr tablet Take 50 mg by mouth daily. Take with or immediately following a meal.   Yes [provider]  triamterene -hydrochlorothiazide  (MAXZIDE -25) 37.5-25 MG per tablet Take 0.5 tablets by mouth daily. Takes in the am   Yes [provider]    Physical Exam: Vitals:   12/14/23 2314  BP: (!) 116/57  Pulse: (!) 55  Resp: 18  Temp: 98 F (36.7 C)  TempSrc: Oral  SpO2: 94%     Constitutional: NAD, calm  Eyes: PERTLA, lids and  conjunctivae normal ENMT: Mucous membranes are moist. Posterior pharynx clear of any exudate or lesions.   Neck: supple, no masses  Respiratory: no wheezing, no crackles. No accessory muscle use.  Cardiovascular: S1 & S2 heard, regular rate and rhythm. No extremity edema.  Abdomen: No distension, no tenderness, soft. Bowel sounds active.  Musculoskeletal: no clubbing / cyanosis. Right hip tender; neurovascularly intact distally.   Skin: no significant rashes, lesions, ulcers. Warm, dry, well-perfused. Neurologic: CN 2-12 grossly intact. Moving all extremities. Alert and oriented.  Psychiatric: Pleasant. Cooperative.    Labs and Imaging on Admission: I have personally reviewed following labs and imaging studies  CBC: Recent Labs  Lab 12/15/23 0031  WBC 13.6*  NEUTROABS 11.1*  HGB 10.9*  HCT 32.3*  MCV 97.0  PLT 208   Basic Metabolic Panel: Recent Labs  Lab 12/15/23 0031  NA 128*  K 3.4*  CL 98  CO2 19*  GLUCOSE 128*  BUN 14  CREATININE 0.46  CALCIUM 7.8*   GFR: CrCl cannot be calculated (Unknown ideal weight.). Liver Function Tests: No results for input(s): AST, ALT, ALKPHOS, BILITOT, PROT, ALBUMIN in the last 168 hours. No results for input(s): LIPASE, AMYLASE in the last 168 hours. No results for input(s): AMMONIA in the last 168 hours. Coagulation Profile: No results for input(s): INR, PROTIME in the last 168 hours. Cardiac Enzymes: No results for input(s): CKTOTAL, CKMB, CKMBINDEX, TROPONINI in the last 168 hours. BNP (last 3 results) No results for input(s): PROBNP in the last 8760 hours. HbA1C: No results for input(s): HGBA1C in the last 72 hours. CBG: No results for input(s): GLUCAP in the last 168 hours. Lipid Profile: No results for input(s): CHOL, HDL, LDLCALC, TRIG, CHOLHDL, LDLDIRECT in the last 72 hours. Thyroid  Function Tests: No results for input(s): TSH, T4TOTAL, FREET4, T3FREE, THYROIDAB  in the last 72 hours. Anemia Panel: No results for input(s): VITAMINB12, FOLATE, FERRITIN, TIBC, IRON, RETICCTPCT in the last 72 hours. Urine analysis: No results found for: COLORURINE, APPEARANCEUR, LABSPEC, PHURINE, GLUCOSEU, HGBUR, BILIRUBINUR, KETONESUR, PROTEINUR, UROBILINOGEN, NITRITE, LEUKOCYTESUR Sepsis Labs: @LABRCNTIP (procalcitonin:4,lacticidven:4) )No results found for this or any previous visit (from the past 240 hours).   Radiological Exams on Admission: DG HIP UNILAT WITH PELVIS 2-3 VIEWS RIGHT Result Date: 12/15/2023 CLINICAL DATA:  Right hip pain EXAM: DG HIP (WITH OR WITHOUT PELVIS) 2-3V RIGHT COMPARISON:  None Available. FINDINGS: There is an acute intratrochanteric fracture of the right hip with avulsion of the lesser trochanter and override with varus angulation of the distal fracture fragment. Femoral head is still seated within the right acetabulum. Superimposed mild to moderate bilateral degenerative hip arthritis with joint space narrowing. Pelvis and limited view of the left hip are intact. L4-5 lumbar fusion with instrumentation has been performed. IMPRESSION: 1. Acute intratrochanteric fracture of the right hip.  Electronically Signed   By: Dorethia Molt M.D.   On: 12/15/2023 00:07   DG Chest Port 1 View Result Date: 12/15/2023 CLINICAL DATA:  Right hip pain after a fall. EXAM: PORTABLE CHEST 1 VIEW COMPARISON:  12/22/2013 FINDINGS: Heart size and pulmonary vascularity are normal. Lungs are clear. No pleural effusion. No pneumothorax. Mediastinal contours appear intact. Postoperative reverse left shoulder arthroplasty. IMPRESSION: No active disease. Electronically Signed   By: Elsie Gravely M.D.   On: 12/15/2023 00:06    Assessment/Plan   1. Right hip fracture  - Based on the available data, Mrs. Harkless presents an estimated 0.5% risk of perioperative MI or cardiac arrest; no further preoperative cardiac evaluation is indicated   - Continue NPO, pain-control, supportive care   2. Hyponatremia  - Serum sodium 128 in setting of hydrochlorothiazide  use; she appears euvolemic  - Hold hydrochlorothiazide , monitor   3. Hypertension  - Continue metoprolol     4. Hyperlipemia  - Continue statin   5. Hypokalemia  - Replacing    6. Trigeminal neuralgia  - Tegretol    7. Leukocytosis  - No apparent infectious process, likely reactive    DVT prophylaxis: SCDs  Code Status: Full  Level of Care: Level of care: Med-Surg Family Communication: Husband at bedside   Disposition Plan:  Patient is from: home  Anticipated d/c is to: TBD Anticipated d/c date is: 12/19/23  Patient currently: Pending orthopedic surgery consultation and likely operative hip repair  Consults called: Orthopedic surgery  Admission status: Inpatient     Evalene GORMAN Sprinkles, MD [facility]  12/15/2023, 1:19 AM

## 2023-12-15 NOTE — Progress Notes (Signed)
 No charge note, patient seen and examined, admitted overnight, H&P reviewed and I agree with the assessment and plan  83 year old female with HTN, HLD, depression, hyponatremia who comes into the hospital with right hip pain after a fall at home.  She was in her normal state of health, was up ambulating in the kitchen and then lost balance on a rug and within a split of a second she found herself on the floor.  She denies any chest discomfort before, denies any palpitations.  She denies any loss of consciousness.  She denies any recurrent falls, head 1 other fall about 2 years ago where she injured her left shoulder.  She was unable to ambulate afterwards, and decided come to the hospital  Seen this morning in the room, husband is at bedside.  She reports no chest pain, no shortness of breath, no abdominal pain, no nausea or vomiting.  She is usually active around her house, drives, and is able to do all her ADLs without any difficulties.  She denies any chest pains or dyspnea with ambulation, denies any lower extremity swelling  Principal problem Right hip fracture-orthopedic surgery consulted, apparently Dr. Kit with [facility] was contacted by EDP as per their documentation.  Keep n.p.o. until evaluated.  Active problems Hyponatremia-relatively chronic, sodium was 1 08 July 2023.  She is on HCTZ which is now on hold.  Sodium slightly lower this morning at 126, avoid large shifts.  Hold HCTZ  Anemia-mild, no bleeding, likely in the setting of chronic disease  Hypokalemia-replace potassium  Leukocytosis-likely reactive in the setting of hip fracture, resolved  Essential hypertension-continue metoprolol   Hyperlipidemia-continue statin  Scheduled Meds:  metoprolol  succinate  50 mg Oral Daily   potassium chloride   20 mEq Oral Once   pravastatin   40 mg Oral q1800   Continuous Infusions:  lactated ringers      PRN Meds:.bisacodyl , carbamazepine , carbamazepine , fentaNYL  (SUBLIMAZE )  injection, oxyCODONE , polyethylene glycol, prochlorperazine   Keagen Heinlen M. Trixie, MD, PhD [facility]  Between 7 am - 7 pm you can contact me via Amion (for emergencies) or Securechat (non urgent matters).  I am not available 7 pm - 7 am, please contact night coverage MD/APP via Amion

## 2023-12-15 NOTE — Anesthesia Postprocedure Evaluation (Signed)
 Anesthesia Post Note  Patient: Janet Boyer  Procedure(s) Performed: RIGHT INTERTROCHANTERIC INTRAMEDULLARY (IM) NAIL (Right)     Patient location during evaluation: PACU Anesthesia Type: General Level of consciousness: sedated and patient cooperative Pain management: pain level controlled Vital Signs Assessment: post-procedure vital signs reviewed and stable Respiratory status: spontaneous breathing Cardiovascular status: stable Anesthetic complications: no   No notable events documented.  Last Vitals:  Vitals:   12/15/23 1500 12/15/23 1515  BP: (!) 146/47 (!) 152/49  Pulse: (!) 58 (!) 56  Resp: 14 (!) 22  Temp:    SpO2: 100% 100%    Last Pain:  Vitals:   12/15/23 1515  TempSrc:   PainSc: 0-No pain                 Norleen Pope

## 2023-12-15 NOTE — Consult Note (Signed)
 ORTHOPAEDIC CONSULTATION  REQUESTING PHYSICIAN: Trixie Nilda HERO, MD  Chief Complaint: right hip pain  HPI: Janet Boyer is a 83 y.o. female who complains of pain in the right hip after falling at home last night. Had immediate pain and inability to weight bear on the right leg after falling.   Imaging shows an acute intratrochanteric fracture of the right hip with avulsion of the lesser trochanter and override with varus angulation of the distal fracture fragment.    Orthopedics was consulted for evaluation.   Last meal before midnight. No history of MI, CVA, DVT, PE.  Previously ambulatory without assistive devices.  The patient is living at home with her husband.    Past Medical History:  Diagnosis Date   Anxiety    Arthritis    Chronic back pain    Colon polyps    Depression    H/O urinary tract infection    as a young woman   High cholesterol    Hyperglycemia    Hyperlipidemia    Hypertension    Hyponatremia    Pain of left lower extremity    Trigeminal neuralgia    Weight gain    Past Surgical History:  Procedure Laterality Date   ABDOMINAL EXPLORATION SURGERY     as a 83 year old   APPENDECTOMY     COLONOSCOPY     MAXIMUM ACCESS (MAS)POSTERIOR LUMBAR INTERBODY FUSION (PLIF) 1 LEVEL N/A 12/30/2013   Procedure: FOR MAXIMUM ACCESS (MAS) POSTERIOR LUMBAR INTERBODY FUSION LUMBAR FOUR-FIVE;  Surgeon: Alm GORMAN Molt, MD;  Location: [facility];  Service: Neurosurgery;  Laterality: N/A;  FOR MAXIMUM ACCESS (MAS) POSTERIOR LUMBAR INTERBODY FUSION LUMBAR FOUR-FIVE   REVERSE SHOULDER ARTHROPLASTY Left 06/28/2020   Procedure: REVERSE SHOULDER ARTHROPLASTY;  Surgeon: Melita Drivers, MD;  Location: [facility];  Service: Orthopedics;  Laterality: Left;    TONSILLECTOMY     Social History   Socioeconomic History   Marital status: Married    Spouse name: Not on file   Number of children: 0   Years of education: 12   Highest education level: Not on file   Occupational History   Not on file  Tobacco Use   Smoking status: Never   Smokeless tobacco: Never  Vaping Use   Vaping status: Never Used  Substance and Sexual Activity   Alcohol  use: Yes    Alcohol /week: 7.0 standard drinks of alcohol     Types: 7 Glasses of wine per week    Comment: 3 beers daily    Drug use: No   Sexual activity: Not on file  Other Topics Concern   Not on file  Social History Narrative   Not on file   Social Drivers of Health   Financial Resource Strain: Not on file  Food Insecurity: Not on file  Transportation Needs: Not on file  Physical Activity: Not on file  Stress: Not on file  Social Connections: Not on file   Family History  Problem Relation Age of Onset   Dementia Mother    Thyroid  disease Mother    Cancer Sister    Allergies  Allergen Reactions   Other     Strawberries tomatoes   Prior to Admission medications   Medication Sig Start Date End Date Taking? Authorizing Provider  carbamazepine  (TEGRETOL ) 200 MG tablet Take 50-100 mg by mouth 2 (two) times daily as needed (for trigeminal neuralgia). Take 1/4 tablet in the morning and 1/2 tablet in the evening. 11/14/13  Yes [provider]  lovastatin (MEVACOR) 40 MG tablet Take 40 mg by mouth daily. Pt takes after eats a meal in the am   Yes [provider]  metoprolol  succinate (TOPROL -XL) 50 MG 24 hr tablet Take 50 mg by mouth daily. Take with or immediately following a meal.   Yes [provider]  triamterene -hydrochlorothiazide  (MAXZIDE -25) 37.5-25 MG per tablet Take 0.5 tablets by mouth daily. Takes in the am   Yes [provider]   DG HIP UNILAT WITH PELVIS 2-3 VIEWS RIGHT Result Date: 12/15/2023 CLINICAL DATA:  Right hip pain EXAM: DG HIP (WITH OR WITHOUT PELVIS) 2-3V RIGHT COMPARISON:  None Available. FINDINGS: There is an acute intratrochanteric fracture of the right hip with avulsion of the lesser trochanter and override with varus angulation of the  distal fracture fragment. Femoral head is still seated within the right acetabulum. Superimposed mild to moderate bilateral degenerative hip arthritis with joint space narrowing. Pelvis and limited view of the left hip are intact. L4-5 lumbar fusion with instrumentation has been performed. IMPRESSION: 1. Acute intratrochanteric fracture of the right hip. Electronically Signed   By: Dorethia Molt M.D.   On: 12/15/2023 00:07   DG Chest Port 1 View Result Date: 12/15/2023 CLINICAL DATA:  Right hip pain after a fall. EXAM: PORTABLE CHEST 1 VIEW COMPARISON:  12/22/2013 FINDINGS: Heart size and pulmonary vascularity are normal. Lungs are clear. No pleural effusion. No pneumothorax. Mediastinal contours appear intact. Postoperative reverse left shoulder arthroplasty. IMPRESSION: No active disease. Electronically Signed   By: Elsie Gravely M.D.   On: 12/15/2023 00:06    Positive ROS: All other systems have been reviewed and were otherwise negative with the exception of those mentioned in the HPI and as above.  Objective: Labs cbc Recent Labs    12/15/23 0031 12/15/23 0445  WBC 13.6* 8.8  HGB 10.9* 10.4*  HCT 32.3* 30.8*  PLT 208 185    Labs inflam No results for input(s): CRP in the last 72 hours.  Invalid input(s): ESR  Labs coag No results for input(s): INR, PTT in the last 72 hours.  Invalid input(s): PT  Recent Labs    12/15/23 0031 12/15/23 0445  NA 128* 126*  K 3.4* 3.7  CL 98 97*  CO2 19* 22  GLUCOSE 128* 142*  BUN 14 14  CREATININE 0.46 0.68  CALCIUM 7.8* 7.9*    Physical Exam: Vitals:   12/15/23 0600 12/15/23 0746  BP: (!) 124/51   Pulse: (!) 54   Resp: 13   Temp:  98.3 F (36.8 C)  SpO2: 97%    General: Alert, no acute distress.  Laying supine on stretcher, calm, obvious discomfort Mental status: Alert and Oriented x3 Neurologic: Speech Clear and organized, no gross focal findings or movement disorder appreciated. Respiratory: No cyanosis, no  use of accessory musculature Cardiovascular: No pedal edema GI: Abdomen is soft and non-tender, non-distended. Skin: Warm and dry.  No lesions in the area of chief complaint. Extremities: Warm and well perfused w/o edema Psychiatric: Patient is competent for consent with normal mood and affect  MUSCULOSKELETAL:  TTP right hip and proximal thigh, no edema or ecchymosis present, compartments compressible, ROM right foot and ankle intact, leg shortened and externally rotated, NVI  Other extremities are atraumatic with painless ROM and NVI.  Assessment / Plan: Principal Problem:   Closed right hip fracture, initial encounter [facility]) Active Problems:   Hypertension   Hyponatremia   Trigeminal neuralgia   Hypokalemia  Plan to take patient to OR today with Dr. Eulah Pont for a right femur IMN. Keep NWB and NPO for now.     Weightbearing: NWB RLE VTE prophylaxis:  on hold for surgery,   SCDs Pain control: PRN Contact information:  Margarita Rana MD, [facility] PA-C  Jenne Pane PA-C Office [phone] 12/15/2023 9:25 AM

## 2023-12-15 NOTE — Anesthesia Preprocedure Evaluation (Addendum)
Anesthesia Evaluation  Patient identified by MRN, date of birth, ID band Patient awake    Reviewed: Allergy & Precautions, NPO status , Patient's Chart, lab work & pertinent test results, reviewed documented beta blocker date and time   Airway Mallampati: II  TM Distance: >3 FB Neck ROM: Full    Dental no notable dental hx. (+) Dental Advisory Given, Teeth Intact   Pulmonary neg pulmonary ROS   Pulmonary exam normal breath sounds clear to auscultation       Cardiovascular Exercise Tolerance: Good hypertension, Pt. on medications and Pt. on home beta blockers Normal cardiovascular exam Rhythm:Regular Rate:Normal     Neuro/Psych  PSYCHIATRIC DISORDERS Anxiety Depression     Neuromuscular disease    GI/Hepatic negative GI ROS, Neg liver ROS,,,  Endo/Other  negative endocrine ROS    Renal/GU K+ 3.7 Cr 0.50     Musculoskeletal  (+) Arthritis ,    Abdominal   Peds  Hematology Hgb 13.3   Anesthesia Other Findings   Reproductive/Obstetrics                             Anesthesia Physical Anesthesia Plan  ASA: 3  Anesthesia Plan: General   Post-op Pain Management: Ofirmev IV (intra-op)*   Induction: Intravenous  PONV Risk Score and Plan: 3 and Treatment may vary due to age or medical condition, Ondansetron and Dexamethasone  Airway Management Planned: Oral ETT  Additional Equipment: None  Intra-op Plan:   Post-operative Plan: Extubation in OR  Informed Consent: I have reviewed the patients History and Physical, chart, labs and discussed the procedure including the risks, benefits and alternatives for the proposed anesthesia with the patient or authorized representative who has indicated his/her understanding and acceptance.     Dental advisory given  Plan Discussed with: CRNA  Anesthesia Plan Comments: (See PAT note 06/20/2020, Jodell Cipro, PA-C)         Anesthesia  Quick Evaluation

## 2023-12-15 NOTE — Discharge Instructions (Signed)

## 2023-12-15 NOTE — H&P (View-Only) (Signed)
 ORTHOPAEDIC CONSULTATION  REQUESTING PHYSICIAN: Trixie Nilda HERO, MD  Chief Complaint: right hip pain  HPI: Janet Boyer is a 83 y.o. female who complains of pain in the right hip after falling at home last night. Had immediate pain and inability to weight bear on the right leg after falling.   Imaging shows an acute intratrochanteric fracture of the right hip with avulsion of the lesser trochanter and override with varus angulation of the distal fracture fragment.    Orthopedics was consulted for evaluation.   Last meal before midnight. No history of MI, CVA, DVT, PE.  Previously ambulatory without assistive devices.  The patient is living at home with her husband.    Past Medical History:  Diagnosis Date   Anxiety    Arthritis    Chronic back pain    Colon polyps    Depression    H/O urinary tract infection    as a young woman   High cholesterol    Hyperglycemia    Hyperlipidemia    Hypertension    Hyponatremia    Pain of left lower extremity    Trigeminal neuralgia    Weight gain    Past Surgical History:  Procedure Laterality Date   ABDOMINAL EXPLORATION SURGERY     as a 83 year old   APPENDECTOMY     COLONOSCOPY     MAXIMUM ACCESS (MAS)POSTERIOR LUMBAR INTERBODY FUSION (PLIF) 1 LEVEL N/A 12/30/2013   Procedure: FOR MAXIMUM ACCESS (MAS) POSTERIOR LUMBAR INTERBODY FUSION LUMBAR FOUR-FIVE;  Surgeon: Alm GORMAN Molt, MD;  Location: [facility];  Service: Neurosurgery;  Laterality: N/A;  FOR MAXIMUM ACCESS (MAS) POSTERIOR LUMBAR INTERBODY FUSION LUMBAR FOUR-FIVE   REVERSE SHOULDER ARTHROPLASTY Left 06/28/2020   Procedure: REVERSE SHOULDER ARTHROPLASTY;  Surgeon: Melita Drivers, MD;  Location: [facility];  Service: Orthopedics;  Laterality: Left;    TONSILLECTOMY     Social History   Socioeconomic History   Marital status: Married    Spouse name: Not on file   Number of children: 0   Years of education: 12   Highest education level: Not on file   Occupational History   Not on file  Tobacco Use   Smoking status: Never   Smokeless tobacco: Never  Vaping Use   Vaping status: Never Used  Substance and Sexual Activity   Alcohol  use: Yes    Alcohol /week: 7.0 standard drinks of alcohol     Types: 7 Glasses of wine per week    Comment: 3 beers daily    Drug use: No   Sexual activity: Not on file  Other Topics Concern   Not on file  Social History Narrative   Not on file   Social Drivers of Health   Financial Resource Strain: Not on file  Food Insecurity: Not on file  Transportation Needs: Not on file  Physical Activity: Not on file  Stress: Not on file  Social Connections: Not on file   Family History  Problem Relation Age of Onset   Dementia Mother    Thyroid  disease Mother    Cancer Sister    Allergies  Allergen Reactions   Other     Strawberries tomatoes   Prior to Admission medications   Medication Sig Start Date End Date Taking? Authorizing Provider  carbamazepine  (TEGRETOL ) 200 MG tablet Take 50-100 mg by mouth 2 (two) times daily as needed (for trigeminal neuralgia). Take 1/4 tablet in the morning and 1/2 tablet in the evening. 11/14/13  Yes [provider]  lovastatin (MEVACOR) 40 MG tablet Take 40 mg by mouth daily. Pt takes after eats a meal in the am   Yes [provider]  metoprolol  succinate (TOPROL -XL) 50 MG 24 hr tablet Take 50 mg by mouth daily. Take with or immediately following a meal.   Yes [provider]  triamterene -hydrochlorothiazide  (MAXZIDE -25) 37.5-25 MG per tablet Take 0.5 tablets by mouth daily. Takes in the am   Yes [provider]   DG HIP UNILAT WITH PELVIS 2-3 VIEWS RIGHT Result Date: 12/15/2023 CLINICAL DATA:  Right hip pain EXAM: DG HIP (WITH OR WITHOUT PELVIS) 2-3V RIGHT COMPARISON:  None Available. FINDINGS: There is an acute intratrochanteric fracture of the right hip with avulsion of the lesser trochanter and override with varus angulation of the  distal fracture fragment. Femoral head is still seated within the right acetabulum. Superimposed mild to moderate bilateral degenerative hip arthritis with joint space narrowing. Pelvis and limited view of the left hip are intact. L4-5 lumbar fusion with instrumentation has been performed. IMPRESSION: 1. Acute intratrochanteric fracture of the right hip. Electronically Signed   By: Dorethia Molt M.D.   On: 12/15/2023 00:07   DG Chest Port 1 View Result Date: 12/15/2023 CLINICAL DATA:  Right hip pain after a fall. EXAM: PORTABLE CHEST 1 VIEW COMPARISON:  12/22/2013 FINDINGS: Heart size and pulmonary vascularity are normal. Lungs are clear. No pleural effusion. No pneumothorax. Mediastinal contours appear intact. Postoperative reverse left shoulder arthroplasty. IMPRESSION: No active disease. Electronically Signed   By: Elsie Gravely M.D.   On: 12/15/2023 00:06    Positive ROS: All other systems have been reviewed and were otherwise negative with the exception of those mentioned in the HPI and as above.  Objective: Labs cbc Recent Labs    12/15/23 0031 12/15/23 0445  WBC 13.6* 8.8  HGB 10.9* 10.4*  HCT 32.3* 30.8*  PLT 208 185    Labs inflam No results for input(s): CRP in the last 72 hours.  Invalid input(s): ESR  Labs coag No results for input(s): INR, PTT in the last 72 hours.  Invalid input(s): PT  Recent Labs    12/15/23 0031 12/15/23 0445  NA 128* 126*  K 3.4* 3.7  CL 98 97*  CO2 19* 22  GLUCOSE 128* 142*  BUN 14 14  CREATININE 0.46 0.68  CALCIUM 7.8* 7.9*    Physical Exam: Vitals:   12/15/23 0600 12/15/23 0746  BP: (!) 124/51   Pulse: (!) 54   Resp: 13   Temp:  98.3 F (36.8 C)  SpO2: 97%    General: Alert, no acute distress.  Laying supine on stretcher, calm, obvious discomfort Mental status: Alert and Oriented x3 Neurologic: Speech Clear and organized, no gross focal findings or movement disorder appreciated. Respiratory: No cyanosis, no  use of accessory musculature Cardiovascular: No pedal edema GI: Abdomen is soft and non-tender, non-distended. Skin: Warm and dry.  No lesions in the area of chief complaint. Extremities: Warm and well perfused w/o edema Psychiatric: Patient is competent for consent with normal mood and affect  MUSCULOSKELETAL:  TTP right hip and proximal thigh, no edema or ecchymosis present, compartments compressible, ROM right foot and ankle intact, leg shortened and externally rotated, NVI  Other extremities are atraumatic with painless ROM and NVI.  Assessment / Plan: Principal Problem:   Closed right hip fracture, initial encounter [facility]) Active Problems:   Hypertension   Hyponatremia   Trigeminal neuralgia   Hypokalemia  Plan to take patient to OR today with Dr. Beverley for a right femur IMN. Keep NWB and NPO for now.     Weightbearing: NWB RLE VTE prophylaxis:  on hold for surgery,   SCDs Pain control: PRN Contact information:  Evalene Beverley MD, [facility] PA-C  Gerard CHRISTELLA Large PA-C Office [phone] 12/15/2023 9:25 AM

## 2023-12-15 NOTE — Transfer of Care (Signed)
 Immediate Anesthesia Transfer of Care Note  Patient: Janet Boyer  Procedure(s) Performed: RIGHT INTERTROCHANTERIC INTRAMEDULLARY (IM) NAIL (Right)  Patient Location: PACU  Anesthesia Type:General  Level of Consciousness: awake and patient cooperative  Airway & Oxygen Therapy: Patient Spontanous Breathing and Patient connected to face mask  Post-op Assessment: Report given to RN and Post -op Vital signs reviewed and stable  Post vital signs: Reviewed and stable  Last Vitals:  Vitals Value Taken Time  BP 140/109 12/15/23 1432  Temp    Pulse 61 12/15/23 1435  Resp 22 12/15/23 1435  SpO2 100 % 12/15/23 1435  Vitals shown include unfiled device data.  Last Pain:  Vitals:   12/15/23 1114  TempSrc:   PainSc: 8          Complications: No notable events documented.

## 2023-12-16 ENCOUNTER — Encounter (HOSPITAL_COMMUNITY): Payer: Self-pay | Admitting: Orthopedic Surgery

## 2023-12-16 DIAGNOSIS — S72001A Fracture of unspecified part of neck of right femur, initial encounter for closed fracture: Secondary | ICD-10-CM | POA: Diagnosis not present

## 2023-12-16 LAB — CBC
HCT: 25.4 % — ABNORMAL LOW (ref 36.0–46.0)
Hemoglobin: 8.3 g/dL — ABNORMAL LOW (ref 12.0–15.0)
MCH: 32.7 pg (ref 26.0–34.0)
MCHC: 32.7 g/dL (ref 30.0–36.0)
MCV: 100 fL (ref 80.0–100.0)
Platelets: 154 10*3/uL (ref 150–400)
RBC: 2.54 MIL/uL — ABNORMAL LOW (ref 3.87–5.11)
RDW: 13.2 % (ref 11.5–15.5)
WBC: 6.9 10*3/uL (ref 4.0–10.5)
nRBC: 0 % (ref 0.0–0.2)

## 2023-12-16 LAB — BASIC METABOLIC PANEL
Anion gap: 9 (ref 5–15)
BUN: 12 mg/dL (ref 8–23)
CO2: 21 mmol/L — ABNORMAL LOW (ref 22–32)
Calcium: 7.8 mg/dL — ABNORMAL LOW (ref 8.9–10.3)
Chloride: 97 mmol/L — ABNORMAL LOW (ref 98–111)
Creatinine, Ser: 0.58 mg/dL (ref 0.44–1.00)
GFR, Estimated: >60 mL/min (ref >60)
Glucose, Bld: 139 mg/dL — ABNORMAL HIGH (ref 70–99)
Potassium: 3.9 mmol/L (ref 3.5–5.1)
Sodium: 127 mmol/L — ABNORMAL LOW (ref 135–145)

## 2023-12-16 MED ORDER — ADULT MULTIVITAMIN W/MINERALS CH
1.0000 | ORAL_TABLET | Freq: Every day | ORAL | Status: DC
  Administered 2023-12-16 – 2023-12-22 (×7): 1 via ORAL
  Filled 2023-12-16 (×7): qty 1

## 2023-12-16 MED ORDER — BOOST / RESOURCE BREEZE PO LIQD CUSTOM
1.0000 | Freq: Two times a day (BID) | ORAL | Status: DC
  Administered 2023-12-16 – 2023-12-19 (×4): 1 via ORAL

## 2023-12-16 NOTE — Progress Notes (Signed)
 PROGRESS NOTE    Janet Boyer  FMW:[id number] DOB: August 15, 1941 DOA: 12/14/2023 PCP: Yolande Toribio MATSU, MD   Brief Narrative:  83 year old female with HTN, HLD, depression, hyponatremia who comes into the hospital with right hip pain after a fall at home.  Hospitalist called for admission, orthopedics called in consult due to right intertrochanteric femur fracture.  Status post repair with IM nail on 12/15/2023.  Assessment & Plan:   Principal Problem:   Closed right hip fracture, initial encounter [facility]) Active Problems:   Hypertension   Hyponatremia   Trigeminal neuralgia   Hypokalemia  Right hip fracture after mechanical fall  -Status post right intertrochanteric intramedullary nail 12/15/2023 -Appreciate orthopedic insight and recommendations -Pain control ongoing -PT OT to evaluate for possible placement versus disposition home(patient's request) if safe  Asymptomatic hyponatremia Chronic -Questionably in the setting of hydrochlorothiazide  -HCTZ held, labs stable high 120s despite medication discontinuation -noted hyponatremia 3 years ago, this may be her baseline   Anemia-mild, no bleeding, likely in the setting of chronic disease   Hypokalemia-replace potassium   Leukocytosis-likely reactive in the setting of hip fracture, resolved   Essential hypertension-continue metoprolol    Hyperlipidemia-continue statin  DVT prophylaxis: enoxaparin  (LOVENOX ) injection 40 mg Start: 12/16/23 0800 SCDs Start: 12/15/23 1554 Code Status:   Code Status: Full Code Family Communication: Husband at bedside  Status is: Inpatient  Dispo: The patient is from: Home              Anticipated d/c is to: SNF versus home with home health              Anticipated d/c date is: 48 to 72 hours              Patient currently not medically stable for discharge  Consultants:  Orthopedic surgery  Procedures:  Right IM nail 12/15/2023  Antimicrobials:  Perioperatively  Subjective: No  acute issues or events overnight, pain currently well-controlled denies nausea vomiting diarrhea constipation headache fevers chills or chest pain.  Objective: Vitals:   12/15/23 1844 12/15/23 2236 12/16/23 0121 12/16/23 0541  BP: 114/72 (!) 114/54 (!) 133/54 (!) 120/54  Pulse: 66 (!) 59 63 61  Resp: 18 17 17 17   Temp: 98.6 F (37 C) 98.7 F (37.1 C) (!) 97.4 F (36.3 C) 98.8 F (37.1 C)  TempSrc: Oral Oral Oral Oral  SpO2: 91% 100% 94% 98%  Weight:      Height:        Intake/Output Summary (Last 24 hours) at 12/16/2023 0715 Last data filed at 12/16/2023 0600 Gross per 24 hour  Intake 3043.85 ml  Output 850 ml  Net 2193.85 ml   Filed Weights   12/15/23 1114  Weight: 66 kg    Examination:  General:  Pleasantly resting in bed, No acute distress. HEENT:  Normocephalic atraumatic.  Neck:  Without mass or deformity.  Trachea is midline. Lungs:  Clear to auscultate bilaterally without rhonchi, wheeze, or rales. Heart:  Regular rate and rhythm.  Without murmurs, rubs, or gallops. Abdomen:  Soft, nontender, nondistended.  Without guarding or rebound. Extremities: Right lower extremity range of motion and strength limited by pain otherwise within normal limits. Skin: Right hip postoperative bandage clean dry intact  Data Reviewed: I have personally reviewed following labs and imaging studies  CBC: Recent Labs  Lab 12/15/23 0031 12/15/23 0445 12/16/23 0332  WBC 13.6* 8.8 6.9  NEUTROABS 11.1*  --   --   HGB 10.9* 10.4* 8.3*  HCT 32.3* 30.8*  25.4*  MCV 97.0 97.2 100.0  PLT 208 185 154   Basic Metabolic Panel: Recent Labs  Lab 12/15/23 0031 12/15/23 0445 12/16/23 0332  NA 128* 126* 127*  K 3.4* 3.7 3.9  CL 98 97* 97*  CO2 19* 22 21*  GLUCOSE 128* 142* 139*  BUN 14 14 12   CREATININE 0.46 0.68 0.58  CALCIUM 7.8* 7.9* 7.8*  MG  --  1.8  --    GFR: Estimated Creatinine Clearance: 53.6 mL/min (by C-G formula based on SCr of 0.58 mg/dL).  Recent Results (from the  past 240 hours)  Surgical pcr screen     Status: None   Collection Time: 12/15/23 11:36 AM   Specimen: Nasal Mucosa; Nasal Swab  Result Value Ref Range Status   MRSA, PCR NEGATIVE NEGATIVE Final   Staphylococcus aureus NEGATIVE NEGATIVE Final    Comment: (NOTE) The Xpert SA Assay (FDA approved for NASAL specimens in patients 28 years of age and older), is one component of a comprehensive surveillance program. It is not intended to diagnose infection nor to guide or monitor treatment. Performed at [facility], [address], [place], [place] [address]     Radiology Studies: DG HIP UNILAT WITH PELVIS 2-3 VIEWS RIGHT Result Date: 12/15/2023 CLINICAL DATA:  Elective surgery. EXAM: DG HIP (WITH OR WITHOUT PELVIS) 2-3V RIGHT COMPARISON:  Radiograph yesterday FINDINGS: Two fluoroscopic spot views submitted from the operating room. Femoral intramedullary nail with trans trochanteric and distal locking screw traverse comminuted proximal femur fracture. Fluoroscopy time 58 seconds. Dose 7.6 mGy. IMPRESSION: Intraoperative fluoroscopy during proximal femur fracture fixation. Electronically Signed   By: Andrea Gasman M.D.   On: 12/15/2023 15:13   DG C-Arm 1-60 Min-No Report Result Date: 12/15/2023 Fluoroscopy was utilized by the requesting physician.  No radiographic interpretation.   DG HIP UNILAT WITH PELVIS 2-3 VIEWS RIGHT Result Date: 12/15/2023 CLINICAL DATA:  Right hip pain EXAM: DG HIP (WITH OR WITHOUT PELVIS) 2-3V RIGHT COMPARISON:  None Available. FINDINGS: There is an acute intratrochanteric fracture of the right hip with avulsion of the lesser trochanter and override with varus angulation of the distal fracture fragment. Femoral head is still seated within the right acetabulum. Superimposed mild to moderate bilateral degenerative hip arthritis with joint space narrowing. Pelvis and limited view of the left hip are intact. L4-5 lumbar fusion with instrumentation has been  performed. IMPRESSION: 1. Acute intratrochanteric fracture of the right hip. Electronically Signed   By: Dorethia Molt M.D.   On: 12/15/2023 00:07   DG Chest Port 1 View Result Date: 12/15/2023 CLINICAL DATA:  Right hip pain after a fall. EXAM: PORTABLE CHEST 1 VIEW COMPARISON:  12/22/2013 FINDINGS: Heart size and pulmonary vascularity are normal. Lungs are clear. No pleural effusion. No pneumothorax. Mediastinal contours appear intact. Postoperative reverse left shoulder arthroplasty. IMPRESSION: No active disease. Electronically Signed   By: Elsie Gravely M.D.   On: 12/15/2023 00:06        Scheduled Meds:  acetaminophen   1,000 mg Oral Q6H   docusate sodium   100 mg Oral BID   enoxaparin  (LOVENOX ) injection  40 mg Subcutaneous Q24H   metoprolol  succinate  50 mg Oral Daily   pantoprazole   40 mg Oral Daily   potassium chloride   20 mEq Oral Once   pravastatin   40 mg Oral q1800   Continuous Infusions:   LOS: 1 day   Time spent:  Elsie JAYSON Montclair, DO [facility]  If 7PM-7AM, please contact night-coverage [email or web address]  12/16/2023, 7:15 AM

## 2023-12-16 NOTE — Progress Notes (Signed)
 Physical Therapy Treatment Patient Details Name: Janet Boyer MRN: [id number] DOB: 1941/01/10 Today's Date: 12/16/2023   History of Present Illness Pt is an 83 y/o F admitted on 12/14/23 after presenting with c/o R hip pain following a fall at home. Pt found to have an intratrochanteric fx with avulsion of the lesser trochanter & override with varus angulation of the distal fracture fragment. Pt is s/p IM nail on 12/15/23. PMH: HTN, HLD, depression, anxiety, trigeminal neuralgia, hyponatremia    PT Comments  Pt seen for PT tx with pt agreeable. Per nurse, she tried to give pt dilaudid  but IV messed up so pt unable to receive full dose but pt received tylenol  prior to session. Despite being premedicated pt with c/o significant RLE pain & anxiety throughout session with PT providing encouragement. Pt performed STS from recliner with RW & min<>mod assist with cuing for hand placement, assistance to power up & cuing & assist for anterior weight shift to correct posterior lean & increase ease of transfer. Pt attempts taking steps and marching in place but unable to weight bear enough to lift/advance LLE. Pt performs RLE strengthening exercises with cuing for technique. Pt would benefit from ongoing PT services to address strengthening, balance, endurance, to increase independence with mobility & reduce fall risk.    If plan is discharge home, recommend the following: A lot of help with walking and/or transfers;A lot of help with bathing/dressing/bathroom;Assistance with cooking/housework;Assist for transportation;Help with stairs or ramp for entrance   Can travel by private vehicle     No  Equipment Recommendations  Rolling walker (2 wheels);BSC/3in1    Recommendations for Other Services       Precautions / Restrictions Precautions Precautions: Fall Restrictions Weight Bearing Restrictions Per Provider Order: Yes RLE Weight Bearing Per Provider Order: Weight bearing as tolerated      Mobility  Bed Mobility Overal bed mobility: Needs Assistance Bed Mobility: Supine to Sit     Supine to sit: Min assist, Used rails, HOB elevated, Mod assist     General bed mobility comments: not tested, pt received & left sitting in recliner    Transfers Overall transfer level: Needs assistance Equipment used: Rolling walker (2 wheels) Transfers: Sit to/from Stand Sit to Stand: Min assist, Mod assist   Step pivot transfers: Min assist (Pt able to transfer bed>recliner on R with RW & min assist, decreased foot clearance BLE, decreased ability to weight shift to RLE to allow stepping LLE, extra time.)       General transfer comment: STS from recliner with cuing re: hand placement, assistance to power up, cuing & assist for anterior weight shifting    Ambulation/Gait                   Stairs             Wheelchair Mobility     Tilt Bed    Modified Rankin (Stroke Patients Only)       Balance Overall balance assessment: Needs assistance Sitting-balance support: Feet supported, Bilateral upper extremity supported, Single extremity supported Sitting balance-Leahy Scale: Fair Sitting balance - Comments: supervision static sitting EOB   Standing balance support: Reliant on assistive device for balance, Bilateral upper extremity supported, During functional activity Standing balance-Leahy Scale: Poor                              Cognition Arousal: Alert Behavior During Therapy: Anxious Overall Cognitive Status:  Within Functional Limits for tasks assessed                                 General Comments: pt stating I feel like I'm going to explode I feel like I'm going to come out my skin - requests something for anxiety & MD & nurse made aware, PT providing therapeutic listening & encouragement throughout session, family doing so as well        Exercises General Exercises - Lower Extremity Ankle Circles/Pumps: AROM,  Supine, Both, 10 reps Quad Sets: AROM, Supine, Strengthening, Right, 10 reps Short Arc Quad: AROM, Strengthening, Right, 10 reps Long Arc Quad: AROM, Sidelying, Strengthening, Right, 10 reps Heel Slides: AAROM, Strengthening, Right, 10 reps Hip ABduction/ADduction: AAROM, Strengthening, Right, 10 reps Hip Flexion/Marching: AROM, Standing, Right, Strengthening, 5 reps    General Comments General comments (skin integrity, edema, etc.): Pt with c/o nausea during session (beginning once sitting EOB) with nurse notified & administering anti nausea meds.      Pertinent Vitals/Pain Pain Assessment Pain Assessment: Faces Faces Pain Scale: Hurts whole lot Pain Location: R Hip Pain Descriptors / Indicators: Discomfort, Guarding, Grimacing Pain Intervention(s): Monitored during session, Limited activity within patient's tolerance, Premedicated before session, Utilized relaxation techniques, Repositioned    Home Living Family/patient expects to be discharged to:: Private residence Living Arrangements: Spouse/significant other Available Help at Discharge: Family Type of Home: House Home Access: Stairs to enter Entrance Stairs-Rails: Left Entrance Stairs-Number of Steps: 2   Home Layout: One level Home Equipment: Rollator (4 wheels)      Prior Function            PT Goals (current goals can now be found in the care plan section) Acute Rehab PT Goals Patient Stated Goal: decreased pain PT Goal Formulation: With patient Time For Goal Achievement: 12/30/23 Potential to Achieve Goals: Fair Progress towards PT goals: Progressing toward goals    Frequency    7X/week      PT Plan      Co-evaluation              AM-PAC PT 6 Clicks Mobility   Outcome Measure  Help needed turning from your back to your side while in a flat bed without using bedrails?: A Little Help needed moving from lying on your back to sitting on the side of a flat bed without using bedrails?: A  Lot Help needed moving to and from a bed to a chair (including a wheelchair)?: A Little Help needed standing up from a chair using your arms (e.g., wheelchair or bedside chair)?: A Lot Help needed to walk in hospital room?: Total Help needed climbing 3-5 steps with a railing? : Total 6 Click Score: 12    End of Session Equipment Utilized During Treatment: Gait belt Activity Tolerance: Patient limited by pain Patient left: in chair;with chair alarm set;with call bell/phone within reach;with family/visitor present Nurse Communication:  (pain) PT Visit Diagnosis: Unsteadiness on feet (R26.81);Other abnormalities of gait and mobility (R26.89);Difficulty in walking, not elsewhere classified (R26.2);Muscle weakness (generalized) (M62.81);Pain Pain - Right/Left: Right Pain - part of body: Hip     Time: 8698-8674 PT Time Calculation (min) (ACUTE ONLY): 24 min  Charges:    $Therapeutic Exercise: 8-22 mins $Therapeutic Activity: 8-22 mins PT General Charges $$ ACUTE PT VISIT: 1 Visit  Richerd Pinal, PT, DPT 12/16/23, 1:35 PM   Richerd CHRISTELLA Pinal 12/16/2023, 1:33 PM

## 2023-12-16 NOTE — TOC Initial Note (Signed)
 Transition of Care [facility]) - Initial/Assessment Note    Patient Details  Name: Janet Boyer MRN: [id number] Date of Birth: Aug 30, 1941  Transition of Care [facility]) CM/SW Contact:    Luann SHAUNNA Cumming, LCSW Phone Number: 12/16/2023, 3:38 PM  Clinical Narrative:                  CSW met with pt and pt's sister in room to discuss disposition. CSW explained PT recommendation for SNF. Pt prefers to DC home with [facility] and is adamant about this. Pt's sister also states she feels pt would do better at home. Pt lives at home with her husband. Sister lives nearby and plans to assist at home some. Pt already has a 3 in 1 and sister plans on giving pt a rolling walker that she has. They have no preference in [facility] agency. CSW updated MD. [facility] PT/OT arranged with [facility].  Expected Discharge Plan: Home w Home Health Services Barriers to Discharge: Continued Medical Work up   Patient Goals and CMS Choice Patient states their goals for this hospitalization and ongoing recovery are:: Go home CMS Medicare.gov Compare Post Acute Care list provided to:: Patient Choice offered to / list presented to : Patient       Activities of Daily Living   ADL Screening (condition at time of admission) Independently performs ADLs?: Yes (appropriate for developmental age) Is the patient deaf or have difficulty hearing?: No Does the patient have difficulty seeing, even when wearing glasses/contacts?: No Does the patient have difficulty concentrating, remembering, or making decisions?: No  Permission Sought/Granted                  Emotional Assessment              Admission diagnosis:  Closed displaced intertrochanteric fracture of right femur, initial encounter (HCC) [S72.141A] Closed right hip fracture, initial encounter [facility]) [S72.001A] Patient Active Problem List   Diagnosis Date Noted   Closed right hip fracture, initial encounter (HCC) 12/15/2023   Hypokalemia 12/15/2023   Hypertension    Hyponatremia     Trigeminal neuralgia    S/P lumbar spinal fusion 12/30/2013   PCP:  Yolande Toribio MATSU, MD Pharmacy:   [id number] - [place], [place] - [address] AT [facility] [address] [place] [address] Phone: [phone] Fax: [phone]     Social Drivers of Health (SDOH) Social History: SDOH Screenings   Food Insecurity: No Food Insecurity (12/15/2023)  Housing: Low Risk  (12/15/2023)  Transportation Needs: No Transportation Needs (12/15/2023)  Utilities: Not At Risk (12/15/2023)  Social Connections: Socially Integrated (12/15/2023)  Tobacco Use: Low Risk  (12/15/2023)   SDOH Interventions:     Readmission Risk Interventions     No data to display

## 2023-12-16 NOTE — Progress Notes (Signed)
 Subjective: Patient reports pain as moderate.  Tolerating diet.  Urinating.   No CP, SOB.  Has not mobilized OOB with PT yet.   Objective:   VITALS:   Vitals:   12/15/23 1844 12/15/23 2236 12/16/23 0121 12/16/23 0541  BP: 114/72 (!) 114/54 (!) 133/54 (!) 120/54  Pulse: 66 (!) 59 63 61  Resp: 18 17 17 17   Temp: 98.6 F (37 C) 98.7 F (37.1 C) (!) 97.4 F (36.3 C) 98.8 F (37.1 C)  TempSrc: Oral Oral Oral Oral  SpO2: 91% 100% 94% 98%  Weight:      Height:          Latest Ref Rng & Units 12/16/2023    3:32 AM 12/15/2023    4:45 AM 12/15/2023   12:31 AM  CBC  WBC 4.0 - 10.5 K/uL 6.9  8.8  13.6   Hemoglobin 12.0 - 15.0 g/dL 8.3  89.5  89.0   Hematocrit 36.0 - 46.0 % 25.4  30.8  32.3   Platelets 150 - 400 K/uL 154  185  208       Latest Ref Rng & Units 12/16/2023    3:32 AM 12/15/2023    4:45 AM 12/15/2023   12:31 AM  BMP  Glucose 70 - 99 mg/dL 860  857  871   BUN 8 - 23 mg/dL 12  14  14    Creatinine 0.44 - 1.00 mg/dL 9.41  9.31  9.53   Sodium 135 - 145 mmol/L 127  126  128   Potassium 3.5 - 5.1 mmol/L 3.9  3.7  3.4   Chloride 98 - 111 mmol/L 97  97  98   CO2 22 - 32 mmol/L 21  22  19    Calcium 8.9 - 10.3 mg/dL 7.8  7.9  7.8    Intake/Output      02/04 0701 02/05 0700 02/05 0701 02/06 0700   P.O. 600    I.V. (mL/kg) 2095.9 (31.8)    Other 248    IV Piggyback 100    Total Intake(mL/kg) 3043.9 (46.1)    Urine (mL/kg/hr) 750 (0.5) 600 (3)   Emesis/NG output 0    Stool 0    Blood 100    Total Output 850 600   Net +2193.9 -600        Urine Occurrence 2 x    Stool Occurrence 0 x    Emesis Occurrence 0 x       Physical Exam: General: NAD.  Sleeping comfortably in bed, easily awoken, calm Resp: No increased wob Cardio: regular rate and rhythm ABD soft Neurologically intact MSK Neurovascularly intact Sensation intact distally Intact pulses distally Dorsiflexion/Plantar flexion intact Incision: dressing C/D/I   Assessment: 1 Day Post-Op  S/P Procedure(s)  (LRB): RIGHT INTERTROCHANTERIC INTRAMEDULLARY (IM) NAIL (Right) by Dr. Evalene BIRCH. Beverley on 12/15/23  Principal Problem:   Closed right hip fracture, initial encounter [facility]) Active Problems:   Hypertension   Hyponatremia   Trigeminal neuralgia   Hypokalemia   Plan:  Advance diet Up with therapy Incentive Spirometry Elevate and Apply ice  Weightbearing: WBAT RLE Insicional and dressing care: Dressings left intact until follow-up and Reinforce dressings as needed Orthopedic device(s): None Showering: Keep dressing dry VTE prophylaxis: Lovenox  40mg  qd  while inpatient, can switch to ASA 81mg  bid x 30 days upon d/c , SCDs, ambulation Pain control: PRN Follow - up plan: 2 weeks Contact information:  Evalene Beverley MD, Gerard Large PA-C  Dispo:  TBD based  on PT evaluations     Gerard CHRISTELLA Ted DEVONNA Office [phone] 12/16/2023, 10:03 AM

## 2023-12-16 NOTE — Evaluation (Signed)
 Physical Therapy Evaluation Patient Details Name: Janet Boyer MRN: [id number] DOB: 1941/06/10 Today's Date: 12/16/2023  History of Present Illness  Pt is an 83 y/o F admitted on 12/14/23 after presenting with c/o R hip pain following a fall at home. Pt found to have an intratrochanteric fx with avulsion of the lesser trochanter & override with varus angulation of the distal fracture fragment. Pt is s/p IM nail on 12/15/23. PMH: HTN, HLD, depression, anxiety, trigeminal neuralgia, hyponatremia  Clinical Impression  Pt seen for PT evaluation with pt agreeable to tx, spouse present in room. Prior to admission pt was living with spouse in a 1 level home with 2 steps with L rail to enter. Pt ambulated household distances without AD & reports no other falls in the past 6 months. PT provided pt with HEP handout & pt performed RLE strengthening exercises with cuing for technique, AAROM PRN. Pt required min<>mod assist for supine<>sit with HOB elevated, bed rails, min assist for STS & step pivot bed>recliner with RW. Pt is limited by R hip pain & nausea during this session. Will continue to follow pt acutely to progress mobility as able.    If plan is discharge home, recommend the following: A lot of help with walking and/or transfers;A lot of help with bathing/dressing/bathroom;Assistance with cooking/housework;Assist for transportation;Help with stairs or ramp for entrance   Can travel by private vehicle        Equipment Recommendations Rolling walker (2 wheels);BSC/3in1  Recommendations for Other Services       Functional Status Assessment Patient has had a recent decline in their functional status and demonstrates the ability to make significant improvements in function in a reasonable and predictable amount of time.     Precautions / Restrictions Precautions Precautions: Fall Restrictions Weight Bearing Restrictions Per Provider Order: Yes RLE Weight Bearing Per Provider Order: Weight  bearing as tolerated      Mobility  Bed Mobility Overal bed mobility: Needs Assistance Bed Mobility: Supine to Sit     Supine to sit: Min assist, Used rails, HOB elevated, Mod assist     General bed mobility comments: assistance to move RLE to EOB, assistance to upright trunk from elevated HOB, exited R side of bed limited by pain    Transfers Overall transfer level: Needs assistance Equipment used: Rolling walker (2 wheels) Transfers: Sit to/from Stand, Bed to chair/wheelchair/BSC Sit to Stand: Min assist   Step pivot transfers: Min assist (Pt able to transfer bed>recliner on R with RW & min assist, decreased foot clearance BLE, decreased ability to weight shift to RLE to allow stepping LLE, extra time.)       General transfer comment: STS from EOB with education/cuing re: hand placement    Ambulation/Gait                  Stairs            Wheelchair Mobility     Tilt Bed    Modified Rankin (Stroke Patients Only)       Balance Overall balance assessment: Needs assistance Sitting-balance support: Feet supported, Bilateral upper extremity supported, Single extremity supported Sitting balance-Leahy Scale: Fair Sitting balance - Comments: supervision static sitting EOB   Standing balance support: Reliant on assistive device for balance, Bilateral upper extremity supported, During functional activity Standing balance-Leahy Scale: Poor  Pertinent Vitals/Pain Pain Assessment Pain Assessment: Faces Faces Pain Scale: Hurts whole lot Pain Location: R Hip Pain Descriptors / Indicators: Discomfort, Guarding, Grimacing Pain Intervention(s): Monitored during session, Limited activity within patient's tolerance, Repositioned, Patient requesting pain meds-RN notified, Utilized relaxation techniques    Home Living Family/patient expects to be discharged to:: Private residence Living Arrangements: Spouse/significant  other Available Help at Discharge: Family Type of Home: House Home Access: Stairs to enter Entrance Stairs-Rails: Left Entrance Stairs-Number of Steps: 2   Home Layout: One level Home Equipment: Rollator (4 wheels)      Prior Function               Mobility Comments: amulated household distances without AD, no other falls besides this one in the past 6 months, driving ADLs Comments: spouse primarily did grocery shopping, pt did not require assistance for bathing or dressing     Extremity/Trunk Assessment   Upper Extremity Assessment Upper Extremity Assessment: Overall WFL for tasks assessed    Lower Extremity Assessment Lower Extremity Assessment: RLE deficits/detail RLE Deficits / Details: 2/5 knee extension in sitting       Communication   Communication Communication: No apparent difficulties  Cognition Arousal: Alert Behavior During Therapy: WFL for tasks assessed/performed, Anxious Overall Cognitive Status: Within Functional Limits for tasks assessed                                          General Comments General comments (skin integrity, edema, etc.): Pt with c/o nausea during session (beginning once sitting EOB) with nurse notified & administering anti nausea meds.    Exercises General Exercises - Lower Extremity Ankle Circles/Pumps: AROM, Supine, Both, 10 reps Quad Sets: AROM, Supine, Strengthening, Right, 10 reps Short Arc Quad: AROM, Supine, Strengthening, Right, 10 reps Long Arc Quad: AROM, Seated, Strengthening, Right, 10 reps Heel Slides: AAROM, Supine, Strengthening, Right, 10 reps Hip ABduction/ADduction: AAROM, Supine, Strengthening, Right, 10 reps   Assessment/Plan    PT Assessment Patient needs continued PT services  PT Problem List Decreased strength;Pain;Decreased range of motion;Decreased balance;Decreased mobility;Decreased activity tolerance;Decreased safety awareness;Decreased knowledge of precautions;Decreased  knowledge of use of DME       PT Treatment Interventions DME instruction;Balance training;Modalities;Neuromuscular re-education;Gait training;Stair training;Functional mobility training;Therapeutic activities;Therapeutic exercise;Manual techniques;Patient/family education    PT Goals (Current goals can be found in the Care Plan section)  Acute Rehab PT Goals Patient Stated Goal: decreased pain PT Goal Formulation: With patient Time For Goal Achievement: 12/30/23 Potential to Achieve Goals: Good    Frequency 7X/week     Co-evaluation               AM-PAC PT 6 Clicks Mobility  Outcome Measure Help needed turning from your back to your side while in a flat bed without using bedrails?: A Little Help needed moving from lying on your back to sitting on the side of a flat bed without using bedrails?: A Lot Help needed moving to and from a bed to a chair (including a wheelchair)?: A Little Help needed standing up from a chair using your arms (e.g., wheelchair or bedside chair)?: A Little Help needed to walk in hospital room?: A Lot Help needed climbing 3-5 steps with a railing? : Total 6 Click Score: 14    End of Session Equipment Utilized During Treatment: Gait belt Activity Tolerance: Patient limited by pain Patient left: in chair;with chair alarm  set;with call bell/phone within reach;with family/visitor present Nurse Communication: Mobility status PT Visit Diagnosis: Unsteadiness on feet (R26.81);Other abnormalities of gait and mobility (R26.89);Difficulty in walking, not elsewhere classified (R26.2);Muscle weakness (generalized) (M62.81);Pain Pain - Right/Left: Right Pain - part of body: Hip    Time: 0953-1020 PT Time Calculation (min) (ACUTE ONLY): 27 min   Charges:   PT Evaluation $PT Eval Moderate Complexity: 1 Mod PT Treatments $Therapeutic Exercise: 8-22 mins PT General Charges $$ ACUTE PT VISIT: 1 Visit         Richerd Pinal, PT, DPT 12/16/23, 10:39  AM   Richerd CHRISTELLA Pinal 12/16/2023, 10:37 AM

## 2023-12-16 NOTE — Progress Notes (Signed)
 Initial Nutrition Assessment  DOCUMENTATION CODES:   Not applicable  INTERVENTION:  - Regular diet.  - Boost Breeze po BID, each supplement provides 250 kcal and 9 grams of protein - Multivitamin with minerals daily   NUTRITION DIAGNOSIS:   Increased nutrient needs related to hip fracture, post-op healing as evidenced by estimated needs.  GOAL:   Patient will meet greater than or equal to 90% of their needs  MONITOR:   PO intake, Supplement acceptance  REASON FOR ASSESSMENT:   Consult Hip fracture protocol  ASSESSMENT:   83 year old female with HTN, HLD, depression, hyponatremia who presented due to right intertrochanteric femur fracture.  Patient in bedside chair, eating breakfast at time of visit.  She reports a UBW of around 125# (although unsure exactly) and denies any recent changes in weight.  Per EMR, no weight history since 2021 to assess recent weight trends.   She endorses eating 3 meals a day PTA and normal appetite.  Current appetite is normal and patient reports no issues.  Discussed increased nutrient needs to promote healing. She does not like traditional milky supplements like Ensure but hesitantly agreeable to try Boost Breeze.    Medications reviewed and include: Colace  Labs reviewed:  Na 127   NUTRITION - FOCUSED PHYSICAL EXAM:  Unable to obtain, will attempt at next visit  Diet Order:   Diet Order             Diet regular Room service appropriate? Yes; Fluid consistency: Thin  Diet effective now                   EDUCATION NEEDS:  Education needs have been addressed  Skin:  Skin Assessment: Skin Integrity Issues: Skin Integrity Issues:: Incisions Incisions: Right hip  Last BM:  2/5  Height:  Ht Readings from Last 1 Encounters:  12/15/23 5' 7 (1.702 m)   Weight:  Wt Readings from Last 1 Encounters:  12/15/23 66 kg    BMI:  Body mass index is 22.79 kg/m.  Estimated Nutritional Needs:  Kcal:  1650-1850  kcals Protein:  80-90 grams Fluid:  >/= 1.7L    Trude Ned RD, LDN Contact via Secure Chat.

## 2023-12-17 DIAGNOSIS — S72001A Fracture of unspecified part of neck of right femur, initial encounter for closed fracture: Secondary | ICD-10-CM | POA: Diagnosis not present

## 2023-12-17 LAB — CBC
HCT: 23.6 % — ABNORMAL LOW (ref 36.0–46.0)
Hemoglobin: 7.8 g/dL — ABNORMAL LOW (ref 12.0–15.0)
MCH: 33.1 pg (ref 26.0–34.0)
MCHC: 33.1 g/dL (ref 30.0–36.0)
MCV: 100 fL (ref 80.0–100.0)
Platelets: 133 10*3/uL — ABNORMAL LOW (ref 150–400)
RBC: 2.36 MIL/uL — ABNORMAL LOW (ref 3.87–5.11)
RDW: 13.3 % (ref 11.5–15.5)
WBC: 6.7 10*3/uL (ref 4.0–10.5)
nRBC: 0 % (ref 0.0–0.2)

## 2023-12-17 LAB — BASIC METABOLIC PANEL
Anion gap: 10 (ref 5–15)
BUN: 15 mg/dL (ref 8–23)
CO2: 21 mmol/L — ABNORMAL LOW (ref 22–32)
Calcium: 7.7 mg/dL — ABNORMAL LOW (ref 8.9–10.3)
Chloride: 97 mmol/L — ABNORMAL LOW (ref 98–111)
Creatinine, Ser: 0.67 mg/dL (ref 0.44–1.00)
GFR, Estimated: >60 mL/min (ref >60)
Glucose, Bld: 115 mg/dL — ABNORMAL HIGH (ref 70–99)
Potassium: 3.5 mmol/L (ref 3.5–5.1)
Sodium: 128 mmol/L — ABNORMAL LOW (ref 135–145)

## 2023-12-17 NOTE — Progress Notes (Signed)
 Physical Therapy Treatment Patient Details Name: Janet Boyer MRN: [id number] DOB: [birth year] Today's Date: 12/17/2023   History of Present Illness Pt is an 83 y/o F admitted on 12/14/23 after presenting with c/o R hip pain following a fall at home. Pt found to have an intratrochanteric fx with avulsion of the lesser trochanter & override with varus angulation of the distal fracture fragment. Pt is s/p IM nail on 12/15/23. PMH: HTN, HLD, depression, anxiety, trigeminal neuralgia, hyponatremia    PT Comments  Pt cooperative and progressing slowly with mobility including up to [facility] for first time and ambulated increased distance in room.  Pt continues to require significant but decreasing assist for all basic mobility tasks.  PA in room at session end and pt reports she does not want to have SNF level follow up for rehab so goal is for home with HHPT.     If plan is discharge home, recommend the following: A lot of help with walking and/or transfers;A lot of help with bathing/dressing/bathroom;Assistance with cooking/housework;Assist for transportation;Help with stairs or ramp for entrance   Can travel by private vehicle     No  Equipment Recommendations  Rolling walker (2 wheels);BSC/3in1    Recommendations for Other Services       Precautions / Restrictions Precautions Precautions: Fall Restrictions Weight Bearing Restrictions Per Provider Order: No RLE Weight Bearing Per Provider Order: Weight bearing as tolerated     Mobility  Bed Mobility Overal bed mobility: Needs Assistance Bed Mobility: Sit to Supine     Supine to sit: Mod assist Sit to supine: Mod assist   General bed mobility comments: Increased time, use of bedrail, assist to manage R LE, control trunk and complete rotation to EOB sitting with bed pad    Transfers Overall transfer level: Needs assistance Equipment used: Rolling walker (2 wheels) Transfers: Sit to/from Stand Sit to Stand: Min assist, Mod assist,  From elevated surface   Step pivot transfers: Min assist, Mod assist       General transfer comment: Cues for LE management and use of UEs to self assist.  Physical assist to bring wt up and fwd and to balance in standing with RW; step pvt recliner to [facility]    Ambulation/Gait Ambulation/Gait assistance: Min assist, +2 safety/equipment Gait Distance (Feet): 15 Feet Assistive device: Rolling walker (2 wheels) Gait Pattern/deviations: Step-to pattern, Decreased step length - right, Decreased step length - left, Shuffle, Trunk flexed Gait velocity: decr     General Gait Details: Increased time with cues for sequence, posture and position from [employer]             Wheelchair Mobility     Tilt Bed    Modified Rankin (Stroke Patients Only)       Balance Overall balance assessment: Needs assistance Sitting-balance support: Feet supported, No upper extremity supported Sitting balance-Leahy Scale: Fair     Standing balance support: Reliant on assistive device for balance, Bilateral upper extremity supported, During functional activity Standing balance-Leahy Scale: Poor                              Cognition Arousal: Alert Behavior During Therapy: Anxious, WFL for tasks assessed/performed Overall Cognitive Status: Within Functional Limits for tasks assessed  Exercises General Exercises - Lower Extremity Ankle Circles/Pumps: AROM, Supine, Both, 15 reps    General Comments        Pertinent Vitals/Pain Pain Assessment Pain Assessment: 0-10 Pain Score: 7  Pain Location: R hip/LE Pain Descriptors / Indicators: Discomfort, Guarding, Grimacing Pain Intervention(s): Monitored during session, Limited activity within patient's tolerance, Premedicated before session    Home Living                          Prior Function            PT Goals (current goals can now be found in the  care plan section) Acute Rehab PT Goals Patient Stated Goal: decreased pain PT Goal Formulation: With patient Time For Goal Achievement: 12/30/23 Potential to Achieve Goals: Fair Progress towards PT goals: Progressing toward goals    Frequency    7X/week      PT Plan      Co-evaluation              AM-PAC PT 6 Clicks Mobility   Outcome Measure  Help needed turning from your back to your side while in a flat bed without using bedrails?: A Little Help needed moving from lying on your back to sitting on the side of a flat bed without using bedrails?: A Lot Help needed moving to and from a bed to a chair (including a wheelchair)?: A Lot Help needed standing up from a chair using your arms (e.g., wheelchair or bedside chair)?: A Lot Help needed to walk in hospital room?: A Lot Help needed climbing 3-5 steps with a railing? : Total 6 Click Score: 12    End of Session Equipment Utilized During Treatment: Gait belt Activity Tolerance: Patient limited by pain;Patient limited by fatigue Patient left: in bed;with call bell/phone within reach;with bed alarm set;with family/visitor present Nurse Communication: Mobility status PT Visit Diagnosis: Unsteadiness on feet (R26.81);Other abnormalities of gait and mobility (R26.89);Difficulty in walking, not elsewhere classified (R26.2);Muscle weakness (generalized) (M62.81);Pain Pain - Right/Left: Right Pain - part of body: Hip     Time: 1511-1535 PT Time Calculation (min) (ACUTE ONLY): 24 min  Charges:    $Gait Training: 8-22 mins $Therapeutic Activity: 8-22 mins PT General Charges $$ ACUTE PT VISIT: 1 Visit                     Katrinka Acton PT Acute Rehabilitation Services Pager [phone] Office [phone]    Marvon Shillingburg 12/17/2023, 3:40 PM

## 2023-12-17 NOTE — Progress Notes (Signed)
 Subjective: Patient reports pain as moderate to severe. Limiting her ability to progress. Tolerating diet.  Urinating.   No CP, SOB. Still struggling to mobilize OOB with PT.   Objective:   VITALS:   Vitals:   12/16/23 1347 12/16/23 2220 12/17/23 0524 12/17/23 1312  BP: (!) 102/46 127/63 (!) 130/50 (!) 106/51  Pulse: (!) 58 65 75 64  Resp: 16 16 16 16   Temp: 98.4 F (36.9 C) 98.4 F (36.9 C) 99.5 F (37.5 C) 97.7 F (36.5 C)  TempSrc:   Oral   SpO2: 97% 94% 93% 96%  Weight:      Height:          Latest Ref Rng & Units 12/17/2023    3:28 AM 12/16/2023    3:32 AM 12/15/2023    4:45 AM  CBC  WBC 4.0 - 10.5 K/uL 6.7  6.9  8.8   Hemoglobin 12.0 - 15.0 g/dL 7.8  8.3  89.5   Hematocrit 36.0 - 46.0 % 23.6  25.4  30.8   Platelets 150 - 400 K/uL 133  154  185       Latest Ref Rng & Units 12/17/2023    3:28 AM 12/16/2023    3:32 AM 12/15/2023    4:45 AM  BMP  Glucose 70 - 99 mg/dL 884  860  857   BUN 8 - 23 mg/dL 15  12  14    Creatinine 0.44 - 1.00 mg/dL 9.32  9.41  9.31   Sodium 135 - 145 mmol/L 128  127  126   Potassium 3.5 - 5.1 mmol/L 3.5  3.9  3.7   Chloride 98 - 111 mmol/L 97  97  97   CO2 22 - 32 mmol/L 21  21  22    Calcium 8.9 - 10.3 mg/dL 7.7  7.8  7.9    Intake/Output      02/05 0701 02/06 0700 02/06 0701 02/07 0700   P.O. 960    I.V. (mL/kg)     Other     IV Piggyback     Total Intake(mL/kg) 960 (14.5)    Urine (mL/kg/hr) 1450 (0.9) 450 (0.8)   Emesis/NG output     Stool     Blood     Total Output 1450 450   Net -490 -450           Physical Exam: General: NAD.  Laying comfortably in bed, calm  Resp: No increased wob Cardio: regular rate and rhythm ABD soft Neurologically intact MSK Neurovascularly intact Sensation intact distally Intact pulses distally Dorsiflexion/Plantar flexion intact Incision: dressing was slightly torn so I changed it   Assessment: 2 Days Post-Op  S/P Procedure(s) (LRB): RIGHT INTERTROCHANTERIC INTRAMEDULLARY (IM) NAIL  (Right) by Dr. Evalene BIRCH. Beverley on 12/15/23  Principal Problem:   Closed right hip fracture, initial encounter [facility]) Active Problems:   Hypertension   Hyponatremia   Trigeminal neuralgia   Hypokalemia   Plan:  Advance diet Up with therapy Incentive Spirometry Elevate and Apply ice  Weightbearing: WBAT RLE Insicional and dressing care: Dressings left intact until follow-up and Reinforce dressings as needed Orthopedic device(s): None Showering: Keep dressing dry VTE prophylaxis: Lovenox  40mg  qd  while inpatient, can switch to ASA 81mg  bid x 30 days upon d/c , SCDs, ambulation Pain control: PRN Follow - up plan: 2 weeks Contact information:  Evalene Beverley MD, Gerard Large PA-C  Dispo:  PT recommended SNF but patient declines and prefers HHPT. Ok to d/c from  ortho standpoint when medically ready.     Gerard CHRISTELLA Large, PA-C Office [phone] 12/17/2023, 3:59 PM

## 2023-12-17 NOTE — Progress Notes (Signed)
 PROGRESS NOTE    Janet Boyer  FMW:[id number] DOB: March 07, 1941 DOA: 12/14/2023 PCP: Yolande Toribio MATSU, MD   Brief Narrative:  83 year old female with HTN, HLD, depression, hyponatremia who comes into the hospital with right hip pain after a fall at home.  Hospitalist called for admission, orthopedics called in consult due to right intertrochanteric femur fracture.  Status post repair with IM nail on 12/15/2023.  Assessment & Plan:   Principal Problem:   Closed right hip fracture, initial encounter [facility]) Active Problems:   Hypertension   Hyponatremia   Trigeminal neuralgia   Hypokalemia  Right hip fracture after mechanical fall  -Status post right intertrochanteric intramedullary nail 12/15/2023 -Appreciate orthopedic insight and recommendations -Pain control ongoing -PT OT to evaluate for ongoing needs, patient refusing SNF placement, will discuss with family need for appropriate care/equipment at home if patient is to discharge home with home health PT.  Asymptomatic hyponatremia Chronic -Questionably in the setting of hydrochlorothiazide  -HCTZ held, labs stable high 120s despite medication discontinuation - noted hyponatremia 3 years ago, this may be her baseline   Anemia - Mild, no bleeding, likely in the setting of chronic disease   Hypokalemia - Replace potassium   Leukocytosis - Likely reactive in the setting of hip fracture, resolved   Essential hypertension - Continue metoprolol    Hyperlipidemia - Continue statin  DVT prophylaxis: enoxaparin  (LOVENOX ) injection 40 mg Start: 12/16/23 0800 SCDs Start: 12/15/23 1554 Code Status:   Code Status: Full Code Family Communication: Husband at bedside  Status is: Inpatient  Dispo: The patient is from: Home              Anticipated d/c is to: SNF versus home with home health              Anticipated d/c date is: 48 to 72 hours              Patient currently not medically stable for discharge  Consultants:   Orthopedic surgery  Procedures:  Right IM nail 12/15/2023  Antimicrobials:  Perioperatively  Subjective: No acute issues or events overnight, pain currently well-controlled denies nausea vomiting diarrhea constipation headache fevers chills or chest pain.  Remains anxious about ambulating on her recently fractured leg  Objective: Vitals:   12/16/23 1125 12/16/23 1347 12/16/23 2220 12/17/23 0524  BP: (!) 145/58 (!) 102/46 127/63 (!) 130/50  Pulse: 61 (!) 58 65 75  Resp: 18 16 16 16   Temp: 98.5 F (36.9 C) 98.4 F (36.9 C) 98.4 F (36.9 C) 99.5 F (37.5 C)  TempSrc:      SpO2: 100% 97% 94% 93%  Weight:      Height:        Intake/Output Summary (Last 24 hours) at 12/17/2023 9297 Last data filed at 12/17/2023 0600 Gross per 24 hour  Intake 960 ml  Output 1450 ml  Net -490 ml   Filed Weights   12/15/23 1114  Weight: 66 kg    Examination:  General:  Pleasantly resting in bed, No acute distress. HEENT:  Normocephalic atraumatic.  Neck:  Without mass or deformity.  Trachea is midline. Lungs:  Clear to auscultate bilaterally without rhonchi, wheeze, or rales. Heart:  Regular rate and rhythm.  Without murmurs, rubs, or gallops. Abdomen:  Soft, nontender, nondistended.  Without guarding or rebound. Extremities: Right lower extremity range of motion and strength limited by pain otherwise within normal limits. Skin: Right hip postoperative bandage clean dry intact  Data Reviewed: I have personally reviewed following labs  and imaging studies  CBC: Recent Labs  Lab 12/15/23 0031 12/15/23 0445 12/16/23 0332 12/17/23 0328  WBC 13.6* 8.8 6.9 6.7  NEUTROABS 11.1*  --   --   --   HGB 10.9* 10.4* 8.3* 7.8*  HCT 32.3* 30.8* 25.4* 23.6*  MCV 97.0 97.2 100.0 100.0  PLT 208 185 154 133*   Basic Metabolic Panel: Recent Labs  Lab 12/15/23 0031 12/15/23 0445 12/16/23 0332 12/17/23 0328  NA 128* 126* 127* 128*  K 3.4* 3.7 3.9 3.5  CL 98 97* 97* 97*  CO2 19* 22 21* 21*   GLUCOSE 128* 142* 139* 115*  BUN 14 14 12 15   CREATININE 0.46 0.68 0.58 0.67  CALCIUM 7.8* 7.9* 7.8* 7.7*  MG  --  1.8  --   --    GFR: Estimated Creatinine Clearance: 53.6 mL/min (by C-G formula based on SCr of 0.67 mg/dL).  Recent Results (from the past 240 hours)  Surgical pcr screen     Status: None   Collection Time: 12/15/23 11:36 AM   Specimen: Nasal Mucosa; Nasal Swab  Result Value Ref Range Status   MRSA, PCR NEGATIVE NEGATIVE Final   Staphylococcus aureus NEGATIVE NEGATIVE Final    Comment: (NOTE) The Xpert SA Assay (FDA approved for NASAL specimens in patients 45 years of age and older), is one component of a comprehensive surveillance program. It is not intended to diagnose infection nor to guide or monitor treatment. Performed at [facility], [address], [place], [place] [address]     Radiology Studies: DG HIP UNILAT WITH PELVIS 2-3 VIEWS RIGHT Result Date: 12/15/2023 CLINICAL DATA:  Elective surgery. EXAM: DG HIP (WITH OR WITHOUT PELVIS) 2-3V RIGHT COMPARISON:  Radiograph yesterday FINDINGS: Two fluoroscopic spot views submitted from the operating room. Femoral intramedullary nail with trans trochanteric and distal locking screw traverse comminuted proximal femur fracture. Fluoroscopy time 58 seconds. Dose 7.6 mGy. IMPRESSION: Intraoperative fluoroscopy during proximal femur fracture fixation. Electronically Signed   By: Andrea Gasman M.D.   On: 12/15/2023 15:13   DG C-Arm 1-60 Min-No Report Result Date: 12/15/2023 Fluoroscopy was utilized by the requesting physician.  No radiographic interpretation.        Scheduled Meds:  docusate sodium   100 mg Oral BID   enoxaparin  (LOVENOX ) injection  40 mg Subcutaneous Q24H   feeding supplement  1 Container Oral BID BM   metoprolol  succinate  50 mg Oral Daily   multivitamin with minerals  1 tablet Oral Daily   pantoprazole   40 mg Oral Daily   potassium chloride   20 mEq Oral Once   pravastatin    40 mg Oral q1800   Continuous Infusions:   LOS: 2 days   Time spent:  Janet JAYSON Montclair, DO [facility]  If 7PM-7AM, please contact night-coverage [email or web address]  12/17/2023, 7:02 AM

## 2023-12-17 NOTE — Progress Notes (Signed)
 Physical Therapy Treatment Patient Details Name: Janet Boyer MRN: [id number] DOB: 1941-07-26 Today's Date: 12/17/2023   History of Present Illness Pt is an 83 y/o F admitted on 12/14/23 after presenting with c/o R hip pain following a fall at home. Pt found to have an intratrochanteric fx with avulsion of the lesser trochanter & override with varus angulation of the distal fracture fragment. Pt is s/p IM nail on 12/15/23. PMH: HTN, HLD, depression, anxiety, trigeminal neuralgia, hyponatremia    PT Comments  Pt progressing with mobility but continues to require increased time and significant assist for performance of all tasks.  This date, pt progressed beyond bed<>chair transfers and able to ambulate limited distance in room.    If plan is discharge home, recommend the following: A lot of help with walking and/or transfers;A lot of help with bathing/dressing/bathroom;Assistance with cooking/housework;Assist for transportation;Help with stairs or ramp for entrance   Can travel by private vehicle     No  Equipment Recommendations  Rolling walker (2 wheels);BSC/3in1    Recommendations for Other Services       Precautions / Restrictions Precautions Precautions: Fall Restrictions Weight Bearing Restrictions Per Provider Order: No RLE Weight Bearing Per Provider Order: Weight bearing as tolerated     Mobility  Bed Mobility Overal bed mobility: Needs Assistance Bed Mobility: Supine to Sit     Supine to sit: Mod assist     General bed mobility comments: Increased time, use of bedrail, assist to manage R LE, control trunk and complete rotation to EOB sitting with bed pad    Transfers Overall transfer level: Needs assistance Equipment used: Rolling walker (2 wheels) Transfers: Sit to/from Stand Sit to Stand: Min assist, Mod assist, From elevated surface           General transfer comment: Cues for LE management and use of UEs to self assist.  Physical assist to bring wt up  and fwd and to balance in standing with RW    Ambulation/Gait Ambulation/Gait assistance: Min assist, +2 safety/equipment Gait Distance (Feet): 7 Feet Assistive device: Rolling walker (2 wheels) Gait Pattern/deviations: Step-to pattern, Decreased step length - right, Decreased step length - left, Shuffle, Trunk flexed Gait velocity: decr     General Gait Details: Increased time with cues for sequence, posture and position from [employer]             Wheelchair Mobility     Tilt Bed    Modified Rankin (Stroke Patients Only)       Balance Overall balance assessment: Needs assistance Sitting-balance support: Feet supported, No upper extremity supported Sitting balance-Leahy Scale: Fair     Standing balance support: Reliant on assistive device for balance, Bilateral upper extremity supported, During functional activity Standing balance-Leahy Scale: Poor                              Cognition Arousal: Alert Behavior During Therapy: Anxious, WFL for tasks assessed/performed Overall Cognitive Status: Within Functional Limits for tasks assessed                                          Exercises General Exercises - Lower Extremity Ankle Circles/Pumps: AROM, Supine, Both, 15 reps    General Comments        Pertinent Vitals/Pain Pain Assessment Pain Assessment: 0-10 Pain Score:  7  Pain Location: R hip/LE Pain Descriptors / Indicators: Discomfort, Guarding, Grimacing Pain Intervention(s): Limited activity within patient's tolerance, Monitored during session, Premedicated before session, Patient requesting pain meds-RN notified, Ice applied    Home Living                          Prior Function            PT Goals (current goals can now be found in the care plan section) Acute Rehab PT Goals Patient Stated Goal: decreased pain PT Goal Formulation: With patient Time For Goal Achievement: 12/30/23 Potential to  Achieve Goals: Fair Progress towards PT goals: Progressing toward goals    Frequency    7X/week      PT Plan      Co-evaluation              AM-PAC PT 6 Clicks Mobility   Outcome Measure  Help needed turning from your back to your side while in a flat bed without using bedrails?: A Little Help needed moving from lying on your back to sitting on the side of a flat bed without using bedrails?: A Lot Help needed moving to and from a bed to a chair (including a wheelchair)?: A Lot Help needed standing up from a chair using your arms (e.g., wheelchair or bedside chair)?: A Lot Help needed to walk in hospital room?: A Lot Help needed climbing 3-5 steps with a railing? : Total 6 Click Score: 12    End of Session Equipment Utilized During Treatment: Gait belt Activity Tolerance: Patient limited by pain Patient left: in chair;with chair alarm set;with call bell/phone within reach;with family/visitor present   PT Visit Diagnosis: Unsteadiness on feet (R26.81);Other abnormalities of gait and mobility (R26.89);Difficulty in walking, not elsewhere classified (R26.2);Muscle weakness (generalized) (M62.81);Pain Pain - Right/Left: Right Pain - part of body: Hip     Time: 1142-1206 PT Time Calculation (min) (ACUTE ONLY): 24 min  Charges:    $Gait Training: 8-22 mins $Therapeutic Activity: 8-22 mins PT General Charges $$ ACUTE PT VISIT: 1 Visit                     Janet Boyer PT Acute Rehabilitation Services Pager [phone] Office [phone]    Janet Boyer 12/17/2023, 1:06 PM

## 2023-12-18 DIAGNOSIS — S72001A Fracture of unspecified part of neck of right femur, initial encounter for closed fracture: Secondary | ICD-10-CM | POA: Diagnosis not present

## 2023-12-18 NOTE — Progress Notes (Signed)
 Physical Therapy Treatment Patient Details Name: Janet Boyer MRN: [id number] DOB: 01/06/1941 Today's Date: 12/18/2023   History of Present Illness Pt is an 83 y/o F admitted on 12/14/23 after presenting with c/o R hip pain following a fall at home. Pt found to have an intratrochanteric fx with avulsion of the lesser trochanter & override with varus angulation of the distal fracture fragment. Pt is s/p IM nail on 12/15/23. PMH: HTN, HLD, depression, anxiety, trigeminal neuralgia, hyponatremia    PT Comments  Pt continues cooperative but limited by increased pain and fatigue this pm.    If plan is discharge home, recommend the following: A lot of help with walking and/or transfers;A little help with bathing/dressing/bathroom;Assistance with cooking/housework;Assist for transportation;Help with stairs or ramp for entrance   Can travel by private vehicle     Yes  Equipment Recommendations  Rolling walker (2 wheels);BSC/3in1    Recommendations for Other Services       Precautions / Restrictions Precautions Precautions: Fall Restrictions Weight Bearing Restrictions Per Provider Order: No RLE Weight Bearing Per Provider Order: Weight bearing as tolerated     Mobility  Bed Mobility Overal bed mobility: Needs Assistance Bed Mobility: Supine to Sit     Supine to sit: Min assist, +2 for physical assistance, +2 for safety/equipment     General bed mobility comments: Pt up in chair and requests back to same    Transfers Overall transfer level: Needs assistance Equipment used: Rolling walker (2 wheels) Transfers: Sit to/from Stand Sit to Stand: Min assist, Mod assist           General transfer comment: Cues for LE management and use of UEs to self assist.  Physical assist to bring wt up and fwd and to balance in standing with RW; up from EOB; to/from [facility] amd to/from recliner    Ambulation/Gait Ambulation/Gait assistance: Min assist, +2 safety/equipment (chair follow) Gait  Distance (Feet): 25 Feet Assistive device: Rolling walker (2 wheels) Gait Pattern/deviations: Step-to pattern, Decreased step length - right, Decreased step length - left, Shuffle, Trunk flexed Gait velocity: decr     General Gait Details: Increased time with cues for sequence, posture and position from [employer]             Wheelchair Mobility     Tilt Bed    Modified Rankin (Stroke Patients Only)       Balance Overall balance assessment: Needs assistance Sitting-balance support: Feet supported, No upper extremity supported Sitting balance-Leahy Scale: Good     Standing balance support: Reliant on assistive device for balance, During functional activity, Single extremity supported Standing balance-Leahy Scale: Poor                              Cognition Arousal: Alert Behavior During Therapy: Anxious, WFL for tasks assessed/performed Overall Cognitive Status: Within Functional Limits for tasks assessed                                          Exercises General Exercises - Lower Extremity Ankle Circles/Pumps: AROM, Supine, Both, 15 reps Quad Sets: AROM, Supine, Strengthening, Right, 10 reps Heel Slides: AAROM, Strengthening, Right, 20 reps, Supine Hip ABduction/ADduction: AAROM, Strengthening, Right, 10 reps    General Comments        Pertinent Vitals/Pain Pain Assessment Pain Assessment: 0-10 Pain  Score: 7  Pain Location: R hip/LE Pain Descriptors / Indicators: Discomfort, Guarding, Grimacing Pain Intervention(s): Limited activity within patient's tolerance, Monitored during session, Premedicated before session, Ice applied    Home Living                          Prior Function            PT Goals (current goals can now be found in the care plan section) Acute Rehab PT Goals Patient Stated Goal: decreased pain PT Goal Formulation: With patient Time For Goal Achievement: 12/30/23 Potential to Achieve  Goals: Fair Progress towards PT goals: Not progressing toward goals - comment (increased pain/fatigue this pm)    Frequency    7X/week      PT Plan      Co-evaluation              AM-PAC PT 6 Clicks Mobility   Outcome Measure  Help needed turning from your back to your side while in a flat bed without using bedrails?: A Little Help needed moving from lying on your back to sitting on the side of a flat bed without using bedrails?: A Lot Help needed moving to and from a bed to a chair (including a wheelchair)?: A Lot Help needed standing up from a chair using your arms (e.g., wheelchair or bedside chair)?: A Little Help needed to walk in hospital room?: A Little Help needed climbing 3-5 steps with a railing? : A Lot 6 Click Score: 15    End of Session Equipment Utilized During Treatment: Gait belt Activity Tolerance: Patient limited by pain;Patient limited by fatigue Patient left: in chair;with call bell/phone within reach;with chair alarm set;with family/visitor present Nurse Communication: Mobility status PT Visit Diagnosis: Unsteadiness on feet (R26.81);Other abnormalities of gait and mobility (R26.89);Difficulty in walking, not elsewhere classified (R26.2);Muscle weakness (generalized) (M62.81);Pain Pain - Right/Left: Right Pain - part of body: Hip     Time: 8497-8478 PT Time Calculation (min) (ACUTE ONLY): 19 min  Charges:    $Gait Training: 8-22 mins PT General Charges $$ ACUTE PT VISIT: 1 Visit                     Janet Boyer PT Acute Rehabilitation Services Pager [phone] Office [phone]    Janet Boyer 12/18/2023, 4:05 PM

## 2023-12-18 NOTE — Progress Notes (Addendum)
 PROGRESS NOTE    Janet Boyer  FMW:[id number] DOB: June 17, 1941 DOA: 12/14/2023 PCP: Yolande Toribio MATSU, MD   Brief Narrative:  83 year old female with HTN, HLD, depression, hyponatremia who comes into the hospital with right hip pain after a fall at home.  Hospitalist called for admission, orthopedics called in consult due to right intertrochanteric femur fracture.  Status post repair with IM nail on 12/15/2023.  Patient mains medically stable for discharge, plan for discharge home the next 24 to 48 hours, if she is unsafe to discharge home have discussed need for placement at rehab/SNF.  Assessment & Plan:   Principal Problem:   Closed right hip fracture, initial encounter [facility]) Active Problems:   Hypertension   Hyponatremia   Trigeminal neuralgia   Hypokalemia  Right hip fracture after mechanical fall  -Status post right intertrochanteric intramedullary nail 12/15/2023 -Appreciate orthopedic insight and recommendations -Pain control ongoing -PT OT continues to evaluate, patient improving but not yet back to level considered safe for discharge home.  Asymptomatic hyponatremia Chronic -Questionably in the setting of hydrochlorothiazide  -HCTZ held, labs stable high 120s despite medication discontinuation - noted hyponatremia 3 years ago, this may be her baseline   Anemia - Mild, no bleeding, likely in the setting of chronic disease   Hypokalemia - Replace potassium   Leukocytosis - Likely reactive in the setting of hip fracture, resolved   Essential hypertension - Continue metoprolol    Hyperlipidemia - Continue statin  DVT prophylaxis: enoxaparin  (LOVENOX ) injection 40 mg Start: 12/16/23 0800 SCDs Start: 12/15/23 1554 Code Status:   Code Status: Full Code Family Communication: Husband at bedside  Status is: Inpatient  Dispo: The patient is from: Home              Anticipated d/c is to: SNF versus home with home health              Anticipated d/c date is: 48 to 72  hours              Patient currently not medically stable for discharge  Consultants:  Orthopedic surgery  Procedures:  Right IM nail 12/15/2023  Antimicrobials:  Perioperatively  Subjective: No acute issues or events overnight, pain currently well-controlled denies nausea vomiting diarrhea constipation headache fevers chills or chest pain.  Remains anxious about ambulating on her recently fractured leg  Objective: Vitals:   12/17/23 2114 12/18/23 0111 12/18/23 0112 12/18/23 0551  BP: (!) 100/44 (!) 128/52  (!) 126/53  Pulse: 63 65  66  Resp: 16   20  Temp: 98.6 F (37 C)  99.6 F (37.6 C) 99.6 F (37.6 C)  TempSrc: Oral  Oral   SpO2: 97% 97%  95%  Weight:      Height:        Intake/Output Summary (Last 24 hours) at 12/18/2023 0717 Last data filed at 12/18/2023 0600 Gross per 24 hour  Intake 360 ml  Output 1000 ml  Net -640 ml   Filed Weights   12/15/23 1114  Weight: 66 kg    Examination:  General:  Pleasantly resting in bed, No acute distress. HEENT:  Normocephalic atraumatic.  Neck:  Without mass or deformity.  Trachea is midline. Lungs:  Clear to auscultate bilaterally without rhonchi, wheeze, or rales. Heart:  Regular rate and rhythm.  Without murmurs, rubs, or gallops. Abdomen:  Soft, nontender, nondistended.  Without guarding or rebound. Extremities: Right lower extremity range of motion and strength limited by pain otherwise within normal limits. Skin: Right hip  postoperative bandage clean dry intact  Data Reviewed: I have personally reviewed following labs and imaging studies  CBC: Recent Labs  Lab 12/15/23 0031 12/15/23 0445 12/16/23 0332 12/17/23 0328  WBC 13.6* 8.8 6.9 6.7  NEUTROABS 11.1*  --   --   --   HGB 10.9* 10.4* 8.3* 7.8*  HCT 32.3* 30.8* 25.4* 23.6*  MCV 97.0 97.2 100.0 100.0  PLT 208 185 154 133*   Basic Metabolic Panel: Recent Labs  Lab 12/15/23 0031 12/15/23 0445 12/16/23 0332 12/17/23 0328  NA 128* 126* 127* 128*  K 3.4*  3.7 3.9 3.5  CL 98 97* 97* 97*  CO2 19* 22 21* 21*  GLUCOSE 128* 142* 139* 115*  BUN 14 14 12 15   CREATININE 0.46 0.68 0.58 0.67  CALCIUM 7.8* 7.9* 7.8* 7.7*  MG  --  1.8  --   --    GFR: Estimated Creatinine Clearance: 53.6 mL/min (by C-G formula based on SCr of 0.67 mg/dL).  Recent Results (from the past 240 hours)  Surgical pcr screen     Status: None   Collection Time: 12/15/23 11:36 AM   Specimen: Nasal Mucosa; Nasal Swab  Result Value Ref Range Status   MRSA, PCR NEGATIVE NEGATIVE Final   Staphylococcus aureus NEGATIVE NEGATIVE Final    Comment: (NOTE) The Xpert SA Assay (FDA approved for NASAL specimens in patients 60 years of age and older), is one component of a comprehensive surveillance program. It is not intended to diagnose infection nor to guide or monitor treatment. Performed at [facility], [address], [place], [place] [address]     Radiology Studies: No results found.       Scheduled Meds:  docusate sodium   100 mg Oral BID   enoxaparin  (LOVENOX ) injection  40 mg Subcutaneous Q24H   feeding supplement  1 Container Oral BID BM   metoprolol  succinate  50 mg Oral Daily   multivitamin with minerals  1 tablet Oral Daily   pantoprazole   40 mg Oral Daily   potassium chloride   20 mEq Oral Once   pravastatin   40 mg Oral q1800   Continuous Infusions:   LOS: 3 days   Time spent:  Janet JAYSON Montclair, DO [facility]  If 7PM-7AM, please contact night-coverage [email or web address]  12/18/2023, 7:17 AM

## 2023-12-18 NOTE — Progress Notes (Signed)
 Physical Therapy Treatment Patient Details Name: Janet Boyer MRN: [id number] DOB: 05-08-1941 Today's Date: 12/18/2023   History of Present Illness Pt is an 83 y/o F admitted on 12/14/23 after presenting with c/o R hip pain following a fall at home. Pt found to have an intratrochanteric fx with avulsion of the lesser trochanter & override with varus angulation of the distal fracture fragment. Pt is s/p IM nail on 12/15/23. PMH: HTN, HLD, depression, anxiety, trigeminal neuralgia, hyponatremia    PT Comments  Pt continues cooperative but requiring encouragement to progress.  Pt up to ambulate increased distance in hall, performed therex program, and up to [facility] for toileting.  Pt requiring decreasing level of assist all tasks and hopeful for dc home soon.    If plan is discharge home, recommend the following: A lot of help with walking and/or transfers;A little help with bathing/dressing/bathroom;Assistance with cooking/housework;Assist for transportation;Help with stairs or ramp for entrance   Can travel by private vehicle     Yes  Equipment Recommendations  Rolling walker (2 wheels);BSC/3in1    Recommendations for Other Services       Precautions / Restrictions Precautions Precautions: Fall Restrictions Weight Bearing Restrictions Per Provider Order: No RLE Weight Bearing Per Provider Order: Weight bearing as tolerated     Mobility  Bed Mobility Overal bed mobility: Needs Assistance Bed Mobility: Supine to Sit     Supine to sit: Min assist, +2 for physical assistance, +2 for safety/equipment     General bed mobility comments: Increased time, use of bedrail, assist to manage R LE, control trunk and complete rotation to EOB sitting with bed pad    Transfers Overall transfer level: Needs assistance Equipment used: Rolling walker (2 wheels) Transfers: Sit to/from Stand Sit to Stand: Min assist, +2 physical assistance           General transfer comment: Cues for LE  management and use of UEs to self assist.  Physical assist to bring wt up and fwd and to balance in standing with RW; up from EOB; to/from [facility] amd to/from recliner    Ambulation/Gait Ambulation/Gait assistance: Min assist, +2 safety/equipment Gait Distance (Feet): 48 Feet (and 5' x 2 recliner to [facility]) Assistive device: Rolling walker (2 wheels) Gait Pattern/deviations: Step-to pattern, Decreased step length - right, Decreased step length - left, Shuffle, Trunk flexed Gait velocity: decr     General Gait Details: Increased time with cues for sequence, posture and position from [employer]             Wheelchair Mobility     Tilt Bed    Modified Rankin (Stroke Patients Only)       Balance Overall balance assessment: Needs assistance Sitting-balance support: Feet supported, No upper extremity supported Sitting balance-Leahy Scale: Fair     Standing balance support: Reliant on assistive device for balance, During functional activity, Single extremity supported Standing balance-Leahy Scale: Poor                              Cognition Arousal: Alert Behavior During Therapy: Anxious, WFL for tasks assessed/performed Overall Cognitive Status: Within Functional Limits for tasks assessed                                          Exercises General Exercises - Lower Extremity Ankle Circles/Pumps: AROM,  Supine, Both, 15 reps Quad Sets: AROM, Supine, Strengthening, Right, 10 reps Heel Slides: AAROM, Strengthening, Right, 20 reps, Supine Hip ABduction/ADduction: AAROM, Strengthening, Right, 10 reps    General Comments        Pertinent Vitals/Pain Pain Assessment Pain Assessment: 0-10 Pain Score: 6  Pain Location: R hip/LE Pain Descriptors / Indicators: Discomfort, Guarding, Grimacing Pain Intervention(s): Limited activity within patient's tolerance, Monitored during session, Premedicated before session, Ice applied    Home Living                           Prior Function            PT Goals (current goals can now be found in the care plan section) Acute Rehab PT Goals Patient Stated Goal: decreased pain PT Goal Formulation: With patient Time For Goal Achievement: 12/30/23 Potential to Achieve Goals: Fair Progress towards PT goals: Progressing toward goals    Frequency    7X/week      PT Plan      Co-evaluation              AM-PAC PT 6 Clicks Mobility   Outcome Measure  Help needed turning from your back to your side while in a flat bed without using bedrails?: A Little Help needed moving from lying on your back to sitting on the side of a flat bed without using bedrails?: A Lot Help needed moving to and from a bed to a chair (including a wheelchair)?: A Lot Help needed standing up from a chair using your arms (e.g., wheelchair or bedside chair)?: A Little Help needed to walk in hospital room?: A Little Help needed climbing 3-5 steps with a railing? : A Lot 6 Click Score: 15    End of Session Equipment Utilized During Treatment: Gait belt Activity Tolerance: Patient limited by pain;Patient limited by fatigue Patient left: in chair;with call bell/phone within reach;with chair alarm set;with family/visitor present Nurse Communication: Mobility status PT Visit Diagnosis: Unsteadiness on feet (R26.81);Other abnormalities of gait and mobility (R26.89);Difficulty in walking, not elsewhere classified (R26.2);Muscle weakness (generalized) (M62.81);Pain Pain - Right/Left: Right Pain - part of body: Hip     Time: 8959-8881 PT Time Calculation (min) (ACUTE ONLY): 38 min  Charges:    $Gait Training: 8-22 mins $Therapeutic Exercise: 8-22 mins $Therapeutic Activity: 8-22 mins PT General Charges $$ ACUTE PT VISIT: 1 Visit                    Katrinka Acton PT Acute Rehabilitation Services Pager [phone] Office [phone]    Valentine Kuechle 12/18/2023, 12:00 PM

## 2023-12-19 DIAGNOSIS — S72001A Fracture of unspecified part of neck of right femur, initial encounter for closed fracture: Secondary | ICD-10-CM | POA: Diagnosis not present

## 2023-12-19 NOTE — Progress Notes (Signed)
 Physical Therapy Treatment Patient Details Name: Janet Boyer MRN: [id number] DOB: 02-09-1941 Today's Date: 12/19/2023   History of Present Illness Pt is an 83 y/o F admitted on 12/14/23 after presenting with c/o R hip pain following a fall at home. Pt found to have an intratrochanteric fx with avulsion of the lesser trochanter & override with varus angulation of the distal fracture fragment. Pt is s/p IM nail on 12/15/23. PMH: HTN, HLD, depression, anxiety, trigeminal neuralgia, hyponatremia    PT Comments  Pt cooperative this pm but continues to require increased time for all tasks, limited by pain with activity, and with increase in instability in standing.  Pt and spouse report considering options besides return home at dc.    If plan is discharge home, recommend the following: A lot of help with walking and/or transfers;A little help with bathing/dressing/bathroom;Assistance with cooking/housework;Assist for transportation;Help with stairs or ramp for entrance   Can travel by private vehicle     Yes  Equipment Recommendations  Rolling walker (2 wheels);BSC/3in1    Recommendations for Other Services       Precautions / Restrictions Precautions Precautions: Fall Restrictions Weight Bearing Restrictions Per Provider Order: No RLE Weight Bearing Per Provider Order: Weight bearing as tolerated     Mobility  Bed Mobility               General bed mobility comments: NT - pt up in chair on arrival - states she slept in chair overnight    Transfers Overall transfer level: Needs assistance Equipment used: Rolling walker (2 wheels) Transfers: Sit to/from Stand Sit to Stand: Min assist           General transfer comment: Cues for LE management and use of UEs to self assist.  Physical assist to bring wt up and fwd and to balance in standing with RW; up from EOB; to/from [facility] amd to/from recliner    Ambulation/Gait Ambulation/Gait assistance: Min assist Gait Distance  (Feet): 25 Feet Assistive device: Rolling walker (2 wheels) Gait Pattern/deviations: Step-to pattern, Decreased step length - right, Decreased step length - left, Shuffle, Trunk flexed Gait velocity: decr     General Gait Details: Increased time with cues for sequence, posture and position from RW.  Distance ltd by Psychiatric Nurse     Tilt Bed    Modified Rankin (Stroke Patients Only)       Balance Overall balance assessment: Needs assistance Sitting-balance support: Feet supported, No upper extremity supported Sitting balance-Leahy Scale: Good Sitting balance - Comments: supervision static sitting EOB   Standing balance support: Reliant on assistive device for balance, During functional activity, Single extremity supported Standing balance-Leahy Scale: Poor                              Cognition Arousal: Alert Behavior During Therapy: Anxious, WFL for tasks assessed/performed Overall Cognitive Status: Within Functional Limits for tasks assessed                                          Exercises      General Comments        Pertinent Vitals/Pain Pain Assessment Pain Assessment: 0-10 Pain Score: 9  Pain Location: R hip/LE Pain Descriptors / Indicators: Discomfort, Guarding,  Grimacing Pain Intervention(s): Limited activity within patient's tolerance, Monitored during session, Premedicated before session, Ice applied    Home Living                          Prior Function            PT Goals (current goals can now be found in the care plan section) Acute Rehab PT Goals Patient Stated Goal: decreased pain PT Goal Formulation: With patient Time For Goal Achievement: 12/30/23 Potential to Achieve Goals: Fair Progress towards PT goals: Progressing toward goals    Frequency    7X/week      PT Plan      Co-evaluation              AM-PAC PT 6 Clicks Mobility    Outcome Measure  Help needed turning from your back to your side while in a flat bed without using bedrails?: A Little Help needed moving from lying on your back to sitting on the side of a flat bed without using bedrails?: A Lot Help needed moving to and from a bed to a chair (including a wheelchair)?: A Little Help needed standing up from a chair using your arms (e.g., wheelchair or bedside chair)?: A Little Help needed to walk in hospital room?: A Little Help needed climbing 3-5 steps with a railing? : A Lot 6 Click Score: 16    End of Session Equipment Utilized During Treatment: Gait belt Activity Tolerance: Patient limited by pain;Patient limited by fatigue Patient left: in chair;with call bell/phone within reach;with chair alarm set;with family/visitor present Nurse Communication: Mobility status PT Visit Diagnosis: Unsteadiness on feet (R26.81);Other abnormalities of gait and mobility (R26.89);Difficulty in walking, not elsewhere classified (R26.2);Muscle weakness (generalized) (M62.81);Pain Pain - Right/Left: Right Pain - part of body: Hip     Time: 1510-1534 PT Time Calculation (min) (ACUTE ONLY): 24 min  Charges:    $Gait Training: 8-22 mins $Therapeutic Activity: 8-22 mins PT General Charges $$ ACUTE PT VISIT: 1 Visit                     Katrinka Acton PT Acute Rehabilitation Services Pager [phone] Office [phone]    Brian Zeitlin 12/19/2023, 4:13 PM

## 2023-12-19 NOTE — Progress Notes (Signed)
 PROGRESS NOTE    Janet Boyer  FMW:[id number] DOB: [birth year] DOA: 12/14/2023 PCP: Yolande Toribio MATSU, MD   Brief Narrative:  83 year old female with HTN, HLD, depression, hyponatremia who comes into the hospital with right hip pain after a fall at home.  Hospitalist called for admission, orthopedics called in consult due to right intertrochanteric femur fracture.  Status post repair with IM nail on 12/15/2023.  Patient mains medically stable for discharge, plan for discharge home the next 24 to 48 hours, if she is unsafe to discharge home have discussed need for placement at rehab/SNF.  Assessment & Plan:   Principal Problem:   Closed right hip fracture, initial encounter [facility]) Active Problems:   Hypertension   Hyponatremia   Trigeminal neuralgia   Hypokalemia  Right hip fracture after mechanical fall  -Status post right intertrochanteric intramedullary nail 12/15/2023 -Appreciate orthopedic insight and recommendations -Pain control ongoing - wean narcotics as appropriate -PT OT continues to evaluate, patient improving but not yet back to level considered safe for discharge home.  Asymptomatic hyponatremia Chronic -Questionably in the setting of hydrochlorothiazide  -HCTZ held, labs stable high 120s despite medication discontinuation - noted hyponatremia 3 years ago, this may be her baseline   Anemia - Mild, no bleeding, likely in the setting of chronic disease Hypokalemia - Replace potassium Leukocytosis - Likely reactive in the setting of hip fracture, resolved Essential hypertension - Continue metoprolol  Hyperlipidemia - Continue statin  DVT prophylaxis: enoxaparin  (LOVENOX ) injection 40 mg Start: 12/16/23 0800 SCDs Start: 12/15/23 1554 Code Status:   Code Status: Full Code Family Communication: Husband at bedside  Status is: Inpatient  Dispo: The patient is from: Home              Anticipated d/c is to: SNF versus home with home health              Anticipated  d/c date is: 48 to 72 hours              Patient currently not medically stable for discharge  Consultants:  Orthopedic surgery  Procedures:  Right IM nail 12/15/2023  Antimicrobials:  Perioperatively  Subjective: No acute issues or events overnight, pain moderately well controlled - worse with ambulation - patient unable to walk more than 10-15 feet right now with assistance due to pain.  Objective: Vitals:   12/18/23 0551 12/18/23 1338 12/18/23 2146 12/19/23 0512  BP: (!) 126/53 (!) 136/48 (!) 120/50 (!) 118/52  Pulse: 66 74 70 69  Resp: 20 15 18 16   Temp: 99.6 F (37.6 C) 98.2 F (36.8 C) 99.8 F (37.7 C) 100.1 F (37.8 C)  TempSrc:  Oral Oral Oral  SpO2: 95% 98% 98% 95%  Weight:      Height:        Intake/Output Summary (Last 24 hours) at 12/19/2023 9277 Last data filed at 12/19/2023 0517 Gross per 24 hour  Intake 600 ml  Output 800 ml  Net -200 ml   Filed Weights   12/15/23 1114  Weight: 66 kg    Examination:  General:  Pleasantly resting in bed, No acute distress. HEENT:  Normocephalic atraumatic.  Neck:  Without mass or deformity.  Trachea is midline. Lungs:  Clear to auscultate bilaterally without rhonchi, wheeze, or rales. Heart:  Regular rate and rhythm.  Without murmurs, rubs, or gallops. Abdomen:  Soft, nontender, nondistended.  Without guarding or rebound. Extremities: Right lower extremity range of motion and strength limited by pain otherwise within normal limits. Skin: Right  hip postoperative bandage clean dry intact  Data Reviewed: I have personally reviewed following labs and imaging studies  CBC: Recent Labs  Lab 12/15/23 0031 12/15/23 0445 12/16/23 0332 12/17/23 0328  WBC 13.6* 8.8 6.9 6.7  NEUTROABS 11.1*  --   --   --   HGB 10.9* 10.4* 8.3* 7.8*  HCT 32.3* 30.8* 25.4* 23.6*  MCV 97.0 97.2 100.0 100.0  PLT 208 185 154 133*   Basic Metabolic Panel: Recent Labs  Lab 12/15/23 0031 12/15/23 0445 12/16/23 0332 12/17/23 0328  NA  128* 126* 127* 128*  K 3.4* 3.7 3.9 3.5  CL 98 97* 97* 97*  CO2 19* 22 21* 21*  GLUCOSE 128* 142* 139* 115*  BUN 14 14 12 15   CREATININE 0.46 0.68 0.58 0.67  CALCIUM 7.8* 7.9* 7.8* 7.7*  MG  --  1.8  --   --    GFR: Estimated Creatinine Clearance: 53.6 mL/min (by C-G formula based on SCr of 0.67 mg/dL).  Recent Results (from the past 240 hours)  Surgical pcr screen     Status: None   Collection Time: 12/15/23 11:36 AM   Specimen: Nasal Mucosa; Nasal Swab  Result Value Ref Range Status   MRSA, PCR NEGATIVE NEGATIVE Final   Staphylococcus aureus NEGATIVE NEGATIVE Final    Comment: (NOTE) The Xpert SA Assay (FDA approved for NASAL specimens in patients 43 years of age and older), is one component of a comprehensive surveillance program. It is not intended to diagnose infection nor to guide or monitor treatment. Performed at [facility], [address], [place], [place] [address]     Radiology Studies: No results found.   Scheduled Meds:  docusate sodium   100 mg Oral BID   enoxaparin  (LOVENOX ) injection  40 mg Subcutaneous Q24H   feeding supplement  1 Container Oral BID BM   metoprolol  succinate  50 mg Oral Daily   multivitamin with minerals  1 tablet Oral Daily   pantoprazole   40 mg Oral Daily   potassium chloride   20 mEq Oral Once   pravastatin   40 mg Oral q1800   Continuous Infusions:   LOS: 4 days   Time spent:  Elsie JAYSON Montclair, DO [facility]  If 7PM-7AM, please contact night-coverage [email or web address]  12/19/2023, 7:22 AM

## 2023-12-19 NOTE — Progress Notes (Signed)
 Physical Therapy Treatment Patient Details Name: Janet Boyer MRN: [id number] DOB: [birth year] Today's Date: 12/19/2023   History of Present Illness Pt is an 83 y/o F admitted on 12/14/23 after presenting with c/o R hip pain following a fall at home. Pt found to have an intratrochanteric fx with avulsion of the lesser trochanter & override with varus angulation of the distal fracture fragment. Pt is s/p IM nail on 12/15/23. PMH: HTN, HLD, depression, anxiety, trigeminal neuralgia, hyponatremia    PT Comments  Pt cooperative but requiring ++encouragement to mobilize beyond standing up from chair.  Noted improvement in transition sit to stand but with attempts to ambulate, pt stopping every few feet with c/o pain/fatigue and request to sit.  With encouragement, pt managed to ambulate 20' before returning to recliner and transporting back to room.   If plan is discharge home, recommend the following: A lot of help with walking and/or transfers;A little help with bathing/dressing/bathroom;Assistance with cooking/housework;Assist for transportation;Help with stairs or ramp for entrance   Can travel by private vehicle     Yes  Equipment Recommendations  Rolling walker (2 wheels);BSC/3in1    Recommendations for Other Services       Precautions / Restrictions Precautions Precautions: Fall Restrictions Weight Bearing Restrictions Per Provider Order: No RLE Weight Bearing Per Provider Order: Weight bearing as tolerated     Mobility  Bed Mobility               General bed mobility comments: NT - pt up in chair on arrival - states she slept in chair overnight    Transfers Overall transfer level: Needs assistance Equipment used: Rolling walker (2 wheels) Transfers: Sit to/from Stand Sit to Stand: Min assist           General transfer comment: Cues for LE management and use of UEs to self assist.  Physical assist to bring wt up and fwd and to balance in standing with RW; up from  EOB; to/from [facility] amd to/from recliner    Ambulation/Gait Ambulation/Gait assistance: Min assist Gait Distance (Feet): 20 Feet Assistive device: Rolling walker (2 wheels) Gait Pattern/deviations: Step-to pattern, Decreased step length - right, Decreased step length - left, Shuffle, Trunk flexed Gait velocity: decr     General Gait Details: Increased time with cues for sequence, posture and position from RW.  Distance ltd by Psychiatric Nurse     Tilt Bed    Modified Rankin (Stroke Patients Only)       Balance Overall balance assessment: Needs assistance Sitting-balance support: Feet supported, No upper extremity supported Sitting balance-Leahy Scale: Good     Standing balance support: Reliant on assistive device for balance, During functional activity, Single extremity supported Standing balance-Leahy Scale: Poor                              Cognition Arousal: Alert Behavior During Therapy: Anxious, WFL for tasks assessed/performed Overall Cognitive Status: Within Functional Limits for tasks assessed                                          Exercises      General Comments        Pertinent Vitals/Pain Pain Assessment Pain Assessment: 0-10 Pain Score: 9  Pain Location: R hip/LE Pain Descriptors / Indicators: Discomfort, Guarding, Grimacing Pain Intervention(s): Limited activity within patient's tolerance, Monitored during session, Premedicated before session, Ice applied    Home Living                          Prior Function            PT Goals (current goals can now be found in the care plan section) Acute Rehab PT Goals Patient Stated Goal: decreased pain PT Goal Formulation: With patient Time For Goal Achievement: 12/30/23 Potential to Achieve Goals: Fair Progress towards PT goals: Progressing toward goals    Frequency    7X/week      PT Plan       Co-evaluation              AM-PAC PT 6 Clicks Mobility   Outcome Measure  Help needed turning from your back to your side while in a flat bed without using bedrails?: A Little Help needed moving from lying on your back to sitting on the side of a flat bed without using bedrails?: A Lot Help needed moving to and from a bed to a chair (including a wheelchair)?: A Little Help needed standing up from a chair using your arms (e.g., wheelchair or bedside chair)?: A Little Help needed to walk in hospital room?: A Little Help needed climbing 3-5 steps with a railing? : A Lot 6 Click Score: 16    End of Session Equipment Utilized During Treatment: Gait belt Activity Tolerance: Patient limited by pain;Patient limited by fatigue Patient left: in chair;with call bell/phone within reach;with chair alarm set;with family/visitor present Nurse Communication: Mobility status PT Visit Diagnosis: Unsteadiness on feet (R26.81);Other abnormalities of gait and mobility (R26.89);Difficulty in walking, not elsewhere classified (R26.2);Muscle weakness (generalized) (M62.81);Pain Pain - Right/Left: Right Pain - part of body: Hip     Time: 9082-9065 PT Time Calculation (min) (ACUTE ONLY): 17 min  Charges:    $Gait Training: 8-22 mins PT General Charges $$ ACUTE PT VISIT: 1 Visit                     Katrinka Acton PT Acute Rehabilitation Services Pager [phone] Office [phone]    Akshay Spang 12/19/2023, 12:10 PM

## 2023-12-20 DIAGNOSIS — S72001A Fracture of unspecified part of neck of right femur, initial encounter for closed fracture: Secondary | ICD-10-CM | POA: Diagnosis not present

## 2023-12-20 MED ORDER — POLYETHYLENE GLYCOL 3350 17 G PO PACK
17.0000 g | PACK | Freq: Two times a day (BID) | ORAL | Status: DC
  Administered 2023-12-20 – 2023-12-21 (×3): 17 g via ORAL
  Filled 2023-12-20 (×5): qty 1

## 2023-12-20 MED ORDER — SENNOSIDES-DOCUSATE SODIUM 8.6-50 MG PO TABS
1.0000 | ORAL_TABLET | Freq: Two times a day (BID) | ORAL | Status: DC
  Administered 2023-12-20 – 2023-12-21 (×4): 1 via ORAL
  Filled 2023-12-20 (×5): qty 1

## 2023-12-20 NOTE — Progress Notes (Signed)
 Physical Therapy Treatment Patient Details Name: Janet Boyer MRN: [id number] DOB: [birth year] Today's Date: 12/20/2023   History of Present Illness Pt is an 83 y/o F admitted on 12/14/23 after presenting with c/o R hip pain following a fall at home. Pt found to have an intratrochanteric fx with avulsion of the lesser trochanter & override with varus angulation of the distal fracture fragment. Pt is s/p IM nail on 12/15/23. PMH: HTN, HLD, depression, anxiety, trigeminal neuralgia, hyponatremia    PT Comments  Pt continues cooperative but requiring increased encouragement and multiple short rest breaks to progress with mobility.  This pm, pt requiring decreasing level of assist to stand from recliner and with marked improvement in ambulated distance tolerated - 44'.    If plan is discharge home, recommend the following: A lot of help with walking and/or transfers;A little help with bathing/dressing/bathroom;Assistance with cooking/housework;Assist for transportation;Help with stairs or ramp for entrance   Can travel by private vehicle     Yes  Equipment Recommendations  Rolling walker (2 wheels);BSC/3in1    Recommendations for Other Services       Precautions / Restrictions Precautions Precautions: Fall Restrictions Weight Bearing Restrictions Per Provider Order: No RLE Weight Bearing Per Provider Order: Weight bearing as tolerated     Mobility  Bed Mobility               General bed mobility comments: Pt up in chair and requests back to same    Transfers Overall transfer level: Needs assistance Equipment used: Rolling walker (2 wheels) Transfers: Sit to/from Stand Sit to Stand: Min assist, Contact guard assist   Step pivot transfers: Min assist       General transfer comment: Cues for LE management and use of UEs to self assist.  Physical assist to bring wt up and fwd and to balance in standing with RW; pt to/from recliner and BSC     Ambulation/Gait Ambulation/Gait assistance: Min assist Gait Distance (Feet): 44 Feet Assistive device: Rolling walker (2 wheels) Gait Pattern/deviations: Step-to pattern, Decreased step length - right, Decreased step length - left, Shuffle, Trunk flexed Gait velocity: decr     General Gait Details: Increased time with cues for sequence, posture and position from RW.  Multiple standing rest breaks and ++encouragement. Distance ltd by pain/fatigue.   Stairs             Wheelchair Mobility     Tilt Bed    Modified Rankin (Stroke Patients Only)       Balance Overall balance assessment: Needs assistance Sitting-balance support: Feet supported, No upper extremity supported Sitting balance-Leahy Scale: Good     Standing balance support: Single extremity supported Standing balance-Leahy Scale: Poor                              Cognition Arousal: Alert Behavior During Therapy: Anxious, WFL for tasks assessed/performed Overall Cognitive Status: Within Functional Limits for tasks assessed                                 General Comments: Pt continues limited by pain/fatigue        Exercises General Exercises - Lower Extremity Ankle Circles/Pumps: AROM, Supine, Both, 15 reps Quad Sets: AROM, Supine, Strengthening, Right, 10 reps Long Arc Quad: AROM, Sidelying, Strengthening, Right, 10 reps Heel Slides: AAROM, Strengthening, Right, 20 reps, Supine Hip ABduction/ADduction: AAROM,  Strengthening, Right, 10 reps    General Comments        Pertinent Vitals/Pain Pain Assessment Pain Assessment: 0-10 Pain Score: 8  Pain Location: R hip/LE Pain Descriptors / Indicators: Guarding, Grimacing, Aching, Sore Pain Intervention(s): Limited activity within patient's tolerance, Monitored during session, Premedicated before session, Ice applied    Home Living                          Prior Function            PT Goals (current  goals can now be found in the care plan section) Acute Rehab PT Goals Patient Stated Goal: decreased pain PT Goal Formulation: With patient Time For Goal Achievement: 12/30/23 Potential to Achieve Goals: Fair Progress towards PT goals: Progressing toward goals    Frequency    7X/week      PT Plan      Co-evaluation              AM-PAC PT 6 Clicks Mobility   Outcome Measure  Help needed turning from your back to your side while in a flat bed without using bedrails?: A Little Help needed moving from lying on your back to sitting on the side of a flat bed without using bedrails?: A Lot Help needed moving to and from a bed to a chair (including a wheelchair)?: A Little Help needed standing up from a chair using your arms (e.g., wheelchair or bedside chair)?: A Little Help needed to walk in hospital room?: A Little Help needed climbing 3-5 steps with a railing? : A Lot 6 Click Score: 16    End of Session Equipment Utilized During Treatment: Gait belt Activity Tolerance: Patient limited by pain;Patient limited by fatigue Patient left: in chair;with call bell/phone within reach;with chair alarm set;with family/visitor present Nurse Communication: Mobility status PT Visit Diagnosis: Unsteadiness on feet (R26.81);Other abnormalities of gait and mobility (R26.89);Difficulty in walking, not elsewhere classified (R26.2);Muscle weakness (generalized) (M62.81);Pain Pain - Right/Left: Right Pain - part of body: Hip;Leg     Time: 1435-1457 PT Time Calculation (min) (ACUTE ONLY): 22 min  Charges:    $Gait Training: 8-22 mins $Therapeutic Exercise: 8-22 mins $Therapeutic Activity: 8-22 mins PT General Charges $$ ACUTE PT VISIT: 1 Visit                     Katrinka Acton PT Acute Rehabilitation Services Pager [phone] Office [phone]    [place] 12/20/2023, 3:59 PM

## 2023-12-20 NOTE — TOC Progression Note (Signed)
 Transition of Care [facility]) - Progression Note    Patient Details  Name: Janet Boyer MRN: [id number] Date of Birth: 09-29-1941  Transition of Care [facility] CM/SW Contact  Rosalva Jon Bloch, RN Phone Number: 12/20/2023, 1:02 PM  Clinical Narrative:        -s/p IM nail on 12/15/23  RNCM spoke with pt and husband ( permission received from pt prior) @ bedside regarding disposition needs.  RNCM received consult for possible SNF placement at time of discharge. RNCM spoke with patient regarding PT recommendation of SNF placement at time of discharge. Patient reports that patient's spouse is currently unable to care for patient at their home given patient's current physical needs and fall risk. Patient expressed understanding of PT recommendation and is agreeable to SNF placement at time of discharge. Patient without preference for SNF . RNCM discussed insurance authorization process and provided Medicare SNF ratings list. Patient expressed being hopeful for rehab and to feel better soon. No further questions reported at this time. RNCM to continue to follow and assist with discharge planning needs.   Expected Discharge Plan: Skilled Nursing Facility Barriers to Discharge: Continued Medical Work up  Expected Discharge Plan and Services                                               Social Determinants of Health (SDOH) Interventions SDOH Screenings   Food Insecurity: No Food Insecurity (12/15/2023)  Housing: Low Risk  (12/15/2023)  Transportation Needs: No Transportation Needs (12/15/2023)  Utilities: Not At Risk (12/15/2023)  Social Connections: Socially Integrated (12/15/2023)  Tobacco Use: Low Risk  (12/15/2023)    Readmission Risk Interventions     No data to display

## 2023-12-20 NOTE — Progress Notes (Signed)
 Physical Therapy Treatment Patient Details Name: Janet Boyer MRN: [id number] DOB: Jan 05, 1941 Today's Date: 12/20/2023   History of Present Illness Pt is an 83 y/o F admitted on 12/14/23 after presenting with c/o R hip pain following a fall at home. Pt found to have an intratrochanteric fx with avulsion of the lesser trochanter & override with varus angulation of the distal fracture fragment. Pt is s/p IM nail on 12/15/23. PMH: HTN, HLD, depression, anxiety, trigeminal neuralgia, hyponatremia    PT Comments  Pt performed gentle therex program but requesting rest break prior to attempting ambulation.   If plan is discharge home, recommend the following: A lot of help with walking and/or transfers;A little help with bathing/dressing/bathroom;Assistance with cooking/housework;Assist for transportation;Help with stairs or ramp for entrance   Can travel by private vehicle     Yes  Equipment Recommendations  Rolling walker (2 wheels);BSC/3in1    Recommendations for Other Services       Precautions / Restrictions Precautions Precautions: Fall Restrictions Weight Bearing Restrictions Per Provider Order: No RLE Weight Bearing Per Provider Order: Weight bearing as tolerated     Mobility  Bed Mobility               General bed mobility comments: Pt up in chair for therex    Transfers                        Ambulation/Gait                   Stairs             Wheelchair Mobility     Tilt Bed    Modified Rankin (Stroke Patients Only)       Balance                                            Cognition Arousal: Alert Behavior During Therapy: Anxious, WFL for tasks assessed/performed Overall Cognitive Status: Within Functional Limits for tasks assessed                                 General Comments: Pt continues limited by pain/fatigue        Exercises General Exercises - Lower Extremity Ankle  Circles/Pumps: AROM, Supine, Both, 15 reps Quad Sets: AROM, Supine, Strengthening, Right, 10 reps Long Arc Quad: AROM, Sidelying, Strengthening, Right, 10 reps Heel Slides: AAROM, Strengthening, Right, 20 reps, Supine Hip ABduction/ADduction: AAROM, Strengthening, Right, 10 reps    General Comments        Pertinent Vitals/Pain Pain Assessment Pain Assessment: 0-10 Pain Score: 8  Pain Location: R hip/LE Pain Descriptors / Indicators: Guarding, Grimacing, Aching, Sore Pain Intervention(s): Limited activity within patient's tolerance, Monitored during session, Premedicated before session, Ice applied    Home Living                          Prior Function            PT Goals (current goals can now be found in the care plan section) Acute Rehab PT Goals Patient Stated Goal: decreased pain PT Goal Formulation: With patient Time For Goal Achievement: 12/30/23 Potential to Achieve Goals: Fair Progress towards PT goals: Progressing toward goals    Frequency  7X/week      PT Plan      Co-evaluation              AM-PAC PT 6 Clicks Mobility   Outcome Measure  Help needed turning from your back to your side while in a flat bed without using bedrails?: A Little Help needed moving from lying on your back to sitting on the side of a flat bed without using bedrails?: A Lot Help needed moving to and from a bed to a chair (including a wheelchair)?: A Little Help needed standing up from a chair using your arms (e.g., wheelchair or bedside chair)?: A Little Help needed to walk in hospital room?: A Little Help needed climbing 3-5 steps with a railing? : A Lot 6 Click Score: 16    End of Session Equipment Utilized During Treatment: Gait belt Activity Tolerance: Patient limited by pain;Patient limited by fatigue Patient left: in chair;with call bell/phone within reach;with chair alarm set;with family/visitor present Nurse Communication: Mobility status PT  Visit Diagnosis: Unsteadiness on feet (R26.81);Other abnormalities of gait and mobility (R26.89);Difficulty in walking, not elsewhere classified (R26.2);Muscle weakness (generalized) (M62.81);Pain Pain - Right/Left: Right Pain - part of body: Hip;Leg     Time: 8975-8961 PT Time Calculation (min) (ACUTE ONLY): 14 min  Charges:    $Therapeutic Exercise: 8-22 mins PT General Charges $$ ACUTE PT VISIT: 1 Visit                     Katrinka Acton PT Acute Rehabilitation Services Pager [phone] Office [phone]    [facility] 12/20/2023, 12:34 PM

## 2023-12-20 NOTE — Progress Notes (Signed)
 PROGRESS NOTE    Janet Boyer  FMW:[id number] DOB: Jun 07, 1941 DOA: 12/14/2023 PCP: Yolande Toribio MATSU, MD   Brief Narrative:  83 year old female with HTN, HLD, depression, hyponatremia who comes into the hospital with right hip pain after a fall at home.  Hospitalist called for admission, orthopedics called in consult due to right intertrochanteric femur fracture.  Status post repair with IM nail on 12/15/2023.  Patient mains medically stable for discharge, plan for discharge home the next 24 to 48 hours, if she is unsafe to discharge home have discussed need for placement at rehab/SNF.  Assessment & Plan:   Principal Problem:   Closed right hip fracture, initial encounter [facility]) Active Problems:   Hypertension   Hyponatremia   Trigeminal neuralgia   Hypokalemia  Right hip fracture after mechanical fall  -Status post right intertrochanteric intramedullary nail 12/15/2023 -Appreciate orthopedic insight and recommendations -Pain control ongoing - wean narcotics as appropriate -PT OT continues to evaluate, patient improving but not yet back to level considered safe for discharge home.  Asymptomatic hyponatremia Chronic -Questionably in the setting of hydrochlorothiazide  -HCTZ held, labs stable high 120s despite medication discontinuation - noted hyponatremia 3 years ago, this may be her baseline   Anemia - Mild, no bleeding, likely in the setting of chronic disease Hypokalemia - Replace potassium Leukocytosis - Likely reactive in the setting of hip fracture, resolved Essential hypertension - Continue metoprolol  Hyperlipidemia - Continue statin  DVT prophylaxis: enoxaparin  (LOVENOX ) injection 40 mg Start: 12/16/23 0800 SCDs Start: 12/15/23 1554 Code Status:   Code Status: Full Code Family Communication: Husband at bedside  Status is: Inpatient  Dispo: The patient is from: Home              Anticipated d/c is to: SNF versus home with home health              Anticipated  d/c date is: 48 to 72 hours              Patient currently not medically stable for discharge  Consultants:  Orthopedic surgery  Procedures:  Right IM nail 12/15/2023  Antimicrobials:  Perioperatively  Subjective: No acute issues or events overnight, pain well-controlled although ambulation continues to be somewhat limited to walking upwards of 15 to 20 feet over the past 24 hours.  Unclear if patient will be safe for discharge home versus requiring further therapy at rehab center.  Objective: Vitals:   12/19/23 0512 12/19/23 1440 12/19/23 2108 12/20/23 0542  BP: (!) 118/52 123/63 (!) 108/53 (!) 115/53  Pulse: 69 71 63 66  Resp: 16 16 16 18   Temp: 100.1 F (37.8 C) 98.3 F (36.8 C) 98.9 F (37.2 C) 97.7 F (36.5 C)  TempSrc: Oral  Oral   SpO2: 95% 94% 94% 94%  Weight:      Height:        Intake/Output Summary (Last 24 hours) at 12/20/2023 0706 Last data filed at 12/20/2023 0551 Gross per 24 hour  Intake 480 ml  Output 450 ml  Net 30 ml   Filed Weights   12/15/23 1114  Weight: 66 kg    Examination:  General:  Pleasantly resting in bed, No acute distress. HEENT:  Normocephalic atraumatic.  Neck:  Without mass or deformity.  Trachea is midline. Lungs:  Clear to auscultate bilaterally without rhonchi, wheeze, or rales. Heart:  Regular rate and rhythm.  Without murmurs, rubs, or gallops. Abdomen:  Soft, nontender, nondistended.  Without guarding or rebound. Extremities: Right lower extremity  range of motion and strength limited by pain otherwise within normal limits. Skin: Right hip postoperative bandage clean dry intact  Data Reviewed: I have personally reviewed following labs and imaging studies  CBC: Recent Labs  Lab 12/15/23 0031 12/15/23 0445 12/16/23 0332 12/17/23 0328  WBC 13.6* 8.8 6.9 6.7  NEUTROABS 11.1*  --   --   --   HGB 10.9* 10.4* 8.3* 7.8*  HCT 32.3* 30.8* 25.4* 23.6*  MCV 97.0 97.2 100.0 100.0  PLT 208 185 154 133*   Basic Metabolic  Panel: Recent Labs  Lab 12/15/23 0031 12/15/23 0445 12/16/23 0332 12/17/23 0328  NA 128* 126* 127* 128*  K 3.4* 3.7 3.9 3.5  CL 98 97* 97* 97*  CO2 19* 22 21* 21*  GLUCOSE 128* 142* 139* 115*  BUN 14 14 12 15   CREATININE 0.46 0.68 0.58 0.67  CALCIUM 7.8* 7.9* 7.8* 7.7*  MG  --  1.8  --   --    GFR: Estimated Creatinine Clearance: 53.6 mL/min (by C-G formula based on SCr of 0.67 mg/dL).  Recent Results (from the past 240 hours)  Surgical pcr screen     Status: None   Collection Time: 12/15/23 11:36 AM   Specimen: Nasal Mucosa; Nasal Swab  Result Value Ref Range Status   MRSA, PCR NEGATIVE NEGATIVE Final   Staphylococcus aureus NEGATIVE NEGATIVE Final    Comment: (NOTE) The Xpert SA Assay (FDA approved for NASAL specimens in patients 23 years of age and older), is one component of a comprehensive surveillance program. It is not intended to diagnose infection nor to guide or monitor treatment. Performed at [facility], [address] [place], [place] [address]     Radiology Studies: No results found.   Scheduled Meds:  docusate sodium   100 mg Oral BID   enoxaparin  (LOVENOX ) injection  40 mg Subcutaneous Q24H   feeding supplement  1 Container Oral BID BM   metoprolol  succinate  50 mg Oral Daily   multivitamin with minerals  1 tablet Oral Daily   pantoprazole   40 mg Oral Daily   potassium chloride   20 mEq Oral Once   pravastatin   40 mg Oral q1800   Continuous Infusions:   LOS: 5 days   Time spent:  Elsie JAYSON Montclair, DO [facility]  If 7PM-7AM, please contact night-coverage [email or web address]  12/20/2023, 7:06 AM

## 2023-12-20 NOTE — NC FL2 (Addendum)
 [place]  MEDICAID FL2 LEVEL OF CARE FORM     IDENTIFICATION  Patient Name: Janet Boyer Birthdate: 01/24/1941 Sex: female Admission Date (Current Location): 12/14/2023  [facility] and [place] Number:  Producer, Television/film/video and Address:         Provider Number: [id number]  Attending Physician Name and Address:  Lue Elsie BROCKS, MD  Relative Name and Phone Number:       Current Level of Care: Hospital Recommended Level of Care: Skilled Nursing Facility Prior Approval Number:    Date Approved/Denied:   PASRR Number: [id number]  Discharge Plan: SNF    Current Diagnoses: Patient Active Problem List   Diagnosis Date Noted   Closed right hip fracture, initial encounter (HCC) 12/15/2023   Hypokalemia 12/15/2023   Hypertension    Hyponatremia    Trigeminal neuralgia    S/P lumbar spinal fusion 12/30/2013    Orientation RESPIRATION BLADDER Height & Weight     Self, Time, Situation, Place  Normal Continent Weight: 66 kg Height:  5' 7 (170.2 cm)  BEHAVIORAL SYMPTOMS/MOOD NEUROLOGICAL BOWEL NUTRITION STATUS      Continent Diet (refer to d/c summary)  AMBULATORY STATUS COMMUNICATION OF NEEDS Skin   Extensive Assist Verbally Normal (s/p IM nail on 12/15/23)                       Personal Care Assistance Level of Assistance  Bathing, Feeding, Dressing Bathing Assistance: Limited assistance Feeding assistance: Independent Dressing Assistance: Limited assistance     Functional Limitations Info  Sight, Hearing, Speech Sight Info: Adequate Hearing Info: Adequate Speech Info: Adequate    SPECIAL CARE FACTORS FREQUENCY  PT (By licensed PT), OT (By licensed OT)     PT Frequency: 5x/week, evaluate and treat OT Frequency: 5x/week, evaluate and treat            Contractures Contractures Info: Not present    Additional Factors Info  Code Status, Allergies Code Status Info: Full Code Allergies Info: tomatoes, strwberries           Current  Medications (12/20/2023):  This is the current hospital active medication list Current Facility-Administered Medications  Medication Dose Route Frequency Provider Last Rate Last Admin   acetaminophen  (TYLENOL ) tablet 325-650 mg  325-650 mg Oral Q6H PRN Gawne, Meghan M, PA-C   650 mg at 12/20/23 1331   alum & mag hydroxide-simeth (MAALOX/MYLANTA) 200-200-20 MG/5ML suspension 30 mL  30 mL Oral Q4H PRN Gawne, Meghan M, PA-C       carbamazepine  (TEGRETOL ) chewable tablet 100 mg  100 mg Oral QHS PRN Gawne, Meghan M, PA-C       carbamazepine  (TEGRETOL ) chewable tablet 50 mg  50 mg Oral Daily PRN Gawne, Meghan M, PA-C       docusate sodium  (COLACE) capsule 100 mg  100 mg Oral BID Gawne, Meghan M, PA-C   100 mg at 12/20/23 9178   enoxaparin  (LOVENOX ) injection 40 mg  40 mg Subcutaneous Q24H Gawne, Meghan M, PA-C   40 mg at 12/20/23 9177   feeding supplement (BOOST / RESOURCE BREEZE) liquid 1 Container  1 Container Oral BID BM Lue Elsie BROCKS, MD   1 Container at 12/19/23 9187   HYDROmorphone  (DILAUDID ) injection 0.5-1 mg  0.5-1 mg Intravenous Q4H PRN Gawne, Meghan M, PA-C   1 mg at 12/16/23 1433   menthol -cetylpyridinium (CEPACOL) lozenge 3 mg  1 lozenge Oral PRN Gawne, Meghan M, PA-C       Or  phenol (CHLORASEPTIC) mouth spray 1 spray  1 spray Mouth/Throat PRN Gawne, Meghan M, PA-C       methocarbamol  (ROBAXIN ) tablet 500 mg  500 mg Oral Q6H PRN Gawne, Meghan M, PA-C   500 mg at 12/17/23 8041   Or   methocarbamol  (ROBAXIN ) injection 500 mg  500 mg Intravenous Q6H PRN Gawne, Meghan M, PA-C       metoCLOPramide  (REGLAN ) tablet 5-10 mg  5-10 mg Oral Q8H PRN Gawne, Meghan M, PA-C       Or   metoCLOPramide  (REGLAN ) injection 5-10 mg  5-10 mg Intravenous Q8H PRN Gawne, Meghan M, PA-C       metoprolol  succinate (TOPROL -XL) 24 hr tablet 50 mg  50 mg Oral Daily Gawne, Meghan M, PA-C   50 mg at 12/20/23 9177   multivitamin with minerals tablet 1 tablet  1 tablet Oral Daily Lue Elsie BROCKS, MD   1 tablet  at 12/20/23 9177   ondansetron  (ZOFRAN ) tablet 4 mg  4 mg Oral Q6H PRN Gawne, Meghan M, PA-C       Or   ondansetron  (ZOFRAN ) injection 4 mg  4 mg Intravenous Q6H PRN Gawne, Meghan M, PA-C   4 mg at 12/16/23 1010   oxyCODONE  (Oxy IR/ROXICODONE ) immediate release tablet 10 mg  10 mg Oral Q4H PRN Gawne, Meghan M, PA-C   10 mg at 12/20/23 1331   oxyCODONE  (Oxy IR/ROXICODONE ) immediate release tablet 5 mg  5 mg Oral Q4H PRN Gawne, Meghan M, PA-C   5 mg at 12/15/23 1541   pantoprazole  (PROTONIX ) EC tablet 40 mg  40 mg Oral Daily Gawne, Meghan M, PA-C   40 mg at 12/20/23 9177   polyethylene glycol (MIRALAX  / GLYCOLAX ) packet 17 g  17 g Oral BID Lue Elsie BROCKS, MD   17 g at 12/20/23 1057   potassium chloride  (KLOR-CON ) packet 20 mEq  20 mEq Oral Once Gawne, Meghan M, PA-C       pravastatin  (PRAVACHOL ) tablet 40 mg  40 mg Oral q1800 Gawne, Meghan M, PA-C   40 mg at 12/19/23 1709   senna-docusate (Senokot-S) tablet 1 tablet  1 tablet Oral BID Lue Elsie BROCKS, MD   1 tablet at 12/20/23 1057     Discharge Medications: Please see discharge summary for a list of discharge medications.  Relevant Imaging Results:  Relevant Lab Results:   Additional Information ss# [id number]  Rosalva Jon Bloch, RN

## 2023-12-20 NOTE — Plan of Care (Signed)
  Problem: Health Behavior/Discharge Planning: Goal: Ability to manage health-related needs will improve Outcome: Adequate for Discharge   Problem: Clinical Measurements: Goal: Ability to maintain clinical measurements within normal limits will improve Outcome: Progressing Goal: Will remain free from infection Outcome: Progressing Goal: Diagnostic test results will improve Outcome: Adequate for Discharge Goal: Respiratory complications will improve Outcome: Progressing Goal: Cardiovascular complication will be avoided Outcome: Adequate for Discharge   Problem: Activity: Goal: Risk for activity intolerance will decrease Outcome: Progressing   Problem: Nutrition: Goal: Adequate nutrition will be maintained Outcome: Adequate for Discharge   Problem: Coping: Goal: Level of anxiety will decrease Outcome: Progressing   Problem: Elimination: Goal: Will not experience complications related to bowel motility Outcome: Progressing   Problem: Pain Managment: Goal: General experience of comfort will improve and/or be controlled Outcome: Progressing   Problem: Safety: Goal: Ability to remain free from injury will improve Outcome: Progressing   Problem: Skin Integrity: Goal: Risk for impaired skin integrity will decrease Outcome: Progressing   Problem: Health Behavior/Discharge Planning: Goal: Ability to manage health-related needs will improve Outcome: Progressing   Problem: Clinical Measurements: Goal: Ability to maintain clinical measurements within normal limits will improve Outcome: Progressing   Problem: Education: Goal: Verbalization of understanding the information provided (i.e., activity precautions, restrictions, etc) will improve Outcome: Progressing Goal: Individualized Educational Video(s) Outcome: Completed/Met   Problem: Activity: Goal: Ability to ambulate and perform ADLs will improve Outcome: Progressing   Problem: Clinical Measurements: Goal:  Postoperative complications will be avoided or minimized Outcome: Progressing   Problem: Self-Concept: Goal: Ability to maintain and perform role responsibilities to the fullest extent possible will improve Outcome: Progressing   Problem: Pain Management: Goal: Pain level will decrease Outcome: Progressing   Problem: Activity: Goal: Ability to ambulate and perform ADLs will improve Outcome: Progressing   Problem: Pain Management: Goal: Pain level will decrease Outcome: Adequate for Discharge

## 2023-12-20 NOTE — Progress Notes (Signed)
 Physical Therapy Treatment Patient Details Name: Janet Boyer MRN: [id number] DOB: 1940-12-21 Today's Date: 12/20/2023   History of Present Illness Pt is an 83 y/o F admitted on 12/14/23 after presenting with c/o R hip pain following a fall at home. Pt found to have an intratrochanteric fx with avulsion of the lesser trochanter & override with varus angulation of the distal fracture fragment. Pt is s/p IM nail on 12/15/23. PMH: HTN, HLD, depression, anxiety, trigeminal neuralgia, hyponatremia    PT Comments  Pt cooperative and agreeable to attempt ambulation but requesting use of BSC first.  Pt assisted up to ambulate short distance to [facility] and successful with BM for first time in days.  Up to ambulate into hallway with several pauses 2* pain/fatigue and only able to ambulate ~10', I just have to stop. Will re-attempt after additional pain meds.    If plan is discharge home, recommend the following: A lot of help with walking and/or transfers;A little help with bathing/dressing/bathroom;Assistance with cooking/housework;Assist for transportation;Help with stairs or ramp for entrance   Can travel by private vehicle     Yes  Equipment Recommendations  Rolling walker (2 wheels);BSC/3in1    Recommendations for Other Services       Precautions / Restrictions Precautions Precautions: Fall Restrictions Weight Bearing Restrictions Per Provider Order: No RLE Weight Bearing Per Provider Order: Weight bearing as tolerated     Mobility  Bed Mobility               General bed mobility comments: Pt up in chair and requests back to same    Transfers Overall transfer level: Needs assistance Equipment used: Rolling walker (2 wheels) Transfers: Sit to/from Stand Sit to Stand: Min assist   Step pivot transfers: Min assist       General transfer comment: Cues for LE management and use of UEs to self assist.  Physical assist to bring wt up and fwd and to balance in standing with RW;  pt to/from recliner and BSC    Ambulation/Gait Ambulation/Gait assistance: Min assist Gait Distance (Feet): 10 Feet (and 5' from recliner to [facility]) Assistive device: Rolling walker (2 wheels) Gait Pattern/deviations: Step-to pattern, Decreased step length - right, Decreased step length - left, Shuffle, Trunk flexed Gait velocity: decr     General Gait Details: Increased time with cues for sequence, posture and position from RW.  Distance ltd by Psychiatric Nurse     Tilt Bed    Modified Rankin (Stroke Patients Only)       Balance Overall balance assessment: Needs assistance Sitting-balance support: Feet supported, No upper extremity supported Sitting balance-Leahy Scale: Good     Standing balance support: Reliant on assistive device for balance, During functional activity, Single extremity supported Standing balance-Leahy Scale: Poor                              Cognition Arousal: Alert Behavior During Therapy: Anxious, WFL for tasks assessed/performed Overall Cognitive Status: Within Functional Limits for tasks assessed                                 General Comments: Pt continues limited by pain/fatigue        Exercises General Exercises - Lower Extremity Ankle Circles/Pumps: AROM, Supine, Both, 15 reps Quad  Sets: AROM, Supine, Strengthening, Right, 10 reps Long Arc Quad: AROM, Sidelying, Strengthening, Right, 10 reps Heel Slides: AAROM, Strengthening, Right, 20 reps, Supine Hip ABduction/ADduction: AAROM, Strengthening, Right, 10 reps    General Comments        Pertinent Vitals/Pain Pain Assessment Pain Assessment: 0-10 Pain Score: 10-Worst pain ever Pain Location: R hip/LE Pain Descriptors / Indicators: Guarding, Grimacing, Aching, Sore Pain Intervention(s): Limited activity within patient's tolerance, Monitored during session, Premedicated before session, Ice applied    Home  Living                          Prior Function            PT Goals (current goals can now be found in the care plan section) Acute Rehab PT Goals Patient Stated Goal: decreased pain PT Goal Formulation: With patient Time For Goal Achievement: 12/30/23 Potential to Achieve Goals: Fair Progress towards PT goals: Progressing toward goals    Frequency    7X/week      PT Plan      Co-evaluation              AM-PAC PT 6 Clicks Mobility   Outcome Measure  Help needed turning from your back to your side while in a flat bed without using bedrails?: A Little Help needed moving from lying on your back to sitting on the side of a flat bed without using bedrails?: A Lot Help needed moving to and from a bed to a chair (including a wheelchair)?: A Little Help needed standing up from a chair using your arms (e.g., wheelchair or bedside chair)?: A Little Help needed to walk in hospital room?: A Little Help needed climbing 3-5 steps with a railing? : A Lot 6 Click Score: 16    End of Session Equipment Utilized During Treatment: Gait belt Activity Tolerance: Patient limited by pain;Patient limited by fatigue Patient left: in chair;with call bell/phone within reach;with chair alarm set;with family/visitor present Nurse Communication: Mobility status PT Visit Diagnosis: Unsteadiness on feet (R26.81);Other abnormalities of gait and mobility (R26.89);Difficulty in walking, not elsewhere classified (R26.2);Muscle weakness (generalized) (M62.81);Pain Pain - Right/Left: Right Pain - part of body: Hip;Leg     Time: 8885-8852 PT Time Calculation (min) (ACUTE ONLY): 33 min  Charges:    $Gait Training: 8-22 mins $Therapeutic Exercise: 8-22 mins $Therapeutic Activity: 8-22 mins PT General Charges $$ ACUTE PT VISIT: 1 Visit                     Katrinka Acton PT Acute Rehabilitation Services Pager [phone] Office [phone]    [place] 12/20/2023,  12:43 PM

## 2023-12-21 DIAGNOSIS — S72001A Fracture of unspecified part of neck of right femur, initial encounter for closed fracture: Secondary | ICD-10-CM | POA: Diagnosis not present

## 2023-12-21 NOTE — TOC Progression Note (Signed)
 Transition of Care [facility]) - Progression Note    Patient Details  Name: Janet Boyer MRN: [id number] Date of Birth: 11-29-1940  Transition of Care [facility]) CM/SW Contact  Gertha Ku, LCSW Phone Number: 12/21/2023, 11:34 AM  Clinical Narrative:    CSW presented pt with list of facilities and their Medicare star rating. They are requesting time to review. TOC to follow.  [facility] [address] [place], [place] [address] [phone] Overall rating ???? Above average  [facility] [address] [place], [place] [address] [phone] Overall rating ??? Average  [facility] [address] [place], [place] [address] [phone] Overall rating ????? Much above average  [facility] [address] [place], [place] [address] [phone] Overall rating ? Much below average  [facility] [address] [place], [place] [address] [phone] Overall rating ?? Below average  [facility] [address] [place], [place] [address] [phone] Overall rating ?? Much below average  [facility] [address] [place], [place] [address] [phone] Overall rating ? Much below average  [facility] [address] [place], [place] [address] [phone] Overall rating ? Much below average  [facility] [address] [place], [place] [address] [phone] Overall rating? Below average  [facility] [facility] [address] [place], [place] [address] [phone] Overall rating ??? Average  Expected Discharge Plan: Skilled Nursing Facility Barriers to Discharge: Continued Medical Work up  Expected Discharge Plan and Services                                               Social Determinants of  Health (SDOH) Interventions SDOH Screenings   Food Insecurity: No Food Insecurity (12/15/2023)  Housing: Low Risk  (12/15/2023)  Transportation Needs: No Transportation Needs (12/15/2023)  Utilities: Not At Risk (12/15/2023)  Social Connections: Socially Integrated (12/15/2023)  Tobacco Use: Low Risk  (12/15/2023)    Readmission Risk Interventions     No data to display

## 2023-12-21 NOTE — Plan of Care (Signed)
  Problem: Health Behavior/Discharge Planning: Goal: Ability to manage health-related needs will improve Outcome: Progressing   Problem: Clinical Measurements: Goal: Ability to maintain clinical measurements within normal limits will improve Outcome: Adequate for Discharge Goal: Will remain free from infection Outcome: Progressing Goal: Diagnostic test results will improve Outcome: Progressing Goal: Respiratory complications will improve Outcome: Progressing Goal: Cardiovascular complication will be avoided Outcome: Adequate for Discharge   Problem: Activity: Goal: Risk for activity intolerance will decrease Outcome: Progressing   Problem: Nutrition: Goal: Adequate nutrition will be maintained Outcome: Completed/Met   Problem: Coping: Goal: Level of anxiety will decrease Outcome: Progressing   Problem: Elimination: Goal: Will not experience complications related to bowel motility Outcome: Completed/Met   Problem: Pain Managment: Goal: General experience of comfort will improve and/or be controlled Outcome: Progressing   Problem: Safety: Goal: Ability to remain free from injury will improve Outcome: Progressing   Problem: Skin Integrity: Goal: Risk for impaired skin integrity will decrease Outcome: Adequate for Discharge   Problem: Health Behavior/Discharge Planning: Goal: Ability to manage health-related needs will improve Outcome: Adequate for Discharge   Problem: Clinical Measurements: Goal: Ability to maintain clinical measurements within normal limits will improve Outcome: Adequate for Discharge Goal: Will remain free from infection Outcome: Progressing Goal: Diagnostic test results will improve Outcome: Adequate for Discharge Goal: Respiratory complications will improve Outcome: Adequate for Discharge Goal: Cardiovascular complication will be avoided Outcome: Adequate for Discharge   Problem: Activity: Goal: Risk for activity intolerance will  decrease Outcome: Progressing   Problem: Nutrition: Goal: Adequate nutrition will be maintained Outcome: Completed/Met   Problem: Elimination: Goal: Will not experience complications related to bowel motility Outcome: Completed/Met Goal: Will not experience complications related to urinary retention Outcome: Completed/Met   Problem: Education: Goal: Verbalization of understanding the information provided (i.e., activity precautions, restrictions, etc) will improve Outcome: Adequate for Discharge   Problem: Activity: Goal: Ability to ambulate and perform ADLs will improve Outcome: Progressing

## 2023-12-21 NOTE — TOC Progression Note (Signed)
 Transition of Care [facility]) - Progression Note    Patient Details  Name: Janet Boyer MRN: [id number] Date of Birth: 1941-04-24  Transition [facility] Care [facility]) CM/SW Contact  Gertha Ku, LCSW Phone Number: 12/21/2023, 9:17 AM  Clinical Narrative:    Pt was not faxed out yet for SNF placement. CSW has sent pt's information out via hub. TOC to follow.    Expected Discharge Plan: Skilled Nursing Facility Barriers to Discharge: Continued Medical Work up  Expected Discharge Plan and Services                                               Social Determinants of Health (SDOH) Interventions SDOH Screenings   Food Insecurity: No Food Insecurity (12/15/2023)  Housing: Low Risk  (12/15/2023)  Transportation Needs: No Transportation Needs (12/15/2023)  Utilities: Not At Risk (12/15/2023)  Social Connections: Socially Integrated (12/15/2023)  Tobacco Use: Low Risk  (12/15/2023)    Readmission Risk Interventions     No data to display

## 2023-12-21 NOTE — Progress Notes (Signed)
 PROGRESS NOTE    Janet Boyer  ZOX:[id number] DOB: 01-03-1941 DOA: 12/14/2023 PCP: Bertha Broad, MD   Brief Narrative:  83 year old female with HTN, HLD, depression, hyponatremia who comes into the hospital with right hip pain after a fall at home.  Hospitalist called for admission, orthopedics called in consult due to right intertrochanteric femur fracture.  Status post repair with IM nail on 12/15/2023.  Patient unfortunately not stable for discharge home, now agreeable for discharge to SNF.  Awaiting bed availability and insurance approval otherwise medically stable for discharge.  Assessment & Plan:   Principal Problem:   Closed right hip fracture, initial encounter [facility]) Active Problems:   Hypertension   Hyponatremia   Trigeminal neuralgia   Hypokalemia  Right hip fracture after mechanical fall  -Status post right intertrochanteric intramedullary nail 12/15/2023 -Appreciate orthopedic insight and recommendations -Pain control ongoing - wean narcotics as appropriate -PT OT continues to evaluate, patient improving but not yet back to level considered safe for discharge home -evaluating for discharge to SNF  Asymptomatic hyponatremia Chronic -Questionably in the setting of hydrochlorothiazide  -HCTZ held, labs stable high 120s despite medication discontinuation - noted hyponatremia 3 years ago, this may be her baseline -Hold HCTZ in the setting of hypotension/normotension, follow-up with PCP in the next few weeks to discuss replacing with alternative medication if necessary   Anemia - Mild, no bleeding, likely in the setting of chronic disease Hypokalemia - Replace potassium Leukocytosis - Likely reactive in the setting of hip fracture, resolved Essential hypertension - Continue metoprolol  -discontinue HCTZ Hyperlipidemia - Continue statin  DVT prophylaxis: enoxaparin  (LOVENOX ) injection 40 mg Start: 12/16/23 0800 SCDs Start: 12/15/23 1554 Code Status:   Code  Status: Full Code Family Communication: Husband at bedside  Status is: Inpatient  Dispo: The patient is from: Home              Anticipated d/c is to: SNF versus home with home health              Anticipated d/c date is: 48 to 72 hours              Patient currently not medically stable for discharge  Consultants:  Orthopedic surgery  Procedures:  Right IM nail 12/15/2023  Antimicrobials:  Perioperatively  Subjective: No acute issues or events overnight, pain moderately well-controlled, patient continues to have instability with ambulation as well as profound weakness.  Otherwise denies nausea vomiting diarrhea constipation any fevers chills or chest pain  Objective: Vitals:   12/20/23 1329 12/20/23 1850 12/20/23 2223 12/21/23 0511  BP: (!) 119/47  (!) 107/51 122/61  Pulse: 68  65 62  Resp: 18  16 18   Temp: (!) 101 F (38.3 C) 97.8 F (36.6 C) 100.3 F (37.9 C) 98.4 F (36.9 C)  TempSrc: Oral Oral Oral Oral  SpO2: 92%  93% 90%  Weight:      Height:        Intake/Output Summary (Last 24 hours) at 12/21/2023 1144 Last data filed at 12/21/2023 1050 Gross per 24 hour  Intake 480 ml  Output --  Net 480 ml   Filed Weights   12/15/23 1114  Weight: 66 kg    Examination:  General:  Pleasantly resting in bed, No acute distress. HEENT:  Normocephalic atraumatic.  Neck:  Without mass or deformity.  Trachea is midline. Lungs:  Clear to auscultate bilaterally without rhonchi, wheeze, or rales. Heart:  Regular rate and rhythm.  Without murmurs, rubs, or gallops. Abdomen:  Soft, nontender, nondistended.  Without guarding or rebound. Extremities: Right lower extremity range of motion and strength limited by pain otherwise within normal limits. Skin: Right hip postoperative bandage clean dry intact  Data Reviewed: I have personally reviewed following labs and imaging studies  CBC: Recent Labs  Lab 12/15/23 0031 12/15/23 0445 12/16/23 0332 12/17/23 0328  WBC 13.6* 8.8  6.9 6.7  NEUTROABS 11.1*  --   --   --   HGB 10.9* 10.4* 8.3* 7.8*  HCT 32.3* 30.8* 25.4* 23.6*  MCV 97.0 97.2 100.0 100.0  PLT 208 185 154 133*   Basic Metabolic Panel: Recent Labs  Lab 12/15/23 0031 12/15/23 0445 12/16/23 0332 12/17/23 0328  NA 128* 126* 127* 128*  K 3.4* 3.7 3.9 3.5  CL 98 97* 97* 97*  CO2 19* 22 21* 21*  GLUCOSE 128* 142* 139* 115*  BUN 14 14 12 15   CREATININE 0.46 0.68 0.58 0.67  CALCIUM 7.8* 7.9* 7.8* 7.7*  MG  --  1.8  --   --    GFR: Estimated Creatinine Clearance: 53.6 mL/min (by C-G formula based on SCr of 0.67 mg/dL).  Recent Results (from the past 240 hours)  Surgical pcr screen     Status: None   Collection Time: 12/15/23 11:36 AM   Specimen: Nasal Mucosa; Nasal Swab  Result Value Ref Range Status   MRSA, PCR NEGATIVE NEGATIVE Final   Staphylococcus aureus NEGATIVE NEGATIVE Final    Comment: (NOTE) The Xpert SA Assay (FDA approved for NASAL specimens in patients 62 years of age and older), is one component of a comprehensive surveillance program. It is not intended to diagnose infection nor to guide or monitor treatment. Performed at [facility], [address] [place], [place] [address]     Radiology Studies: No results found.   Scheduled Meds:  docusate sodium   100 mg Oral BID   enoxaparin  (LOVENOX ) injection  40 mg Subcutaneous Q24H   feeding supplement  1 Container Oral BID BM   metoprolol  succinate  50 mg Oral Daily   multivitamin with minerals  1 tablet Oral Daily   pantoprazole   40 mg Oral Daily   polyethylene glycol  17 g Oral BID   potassium chloride   20 mEq Oral Once   pravastatin   40 mg Oral q1800   senna-docusate  1 tablet Oral BID   Continuous Infusions:   LOS: 6 days   Time spent:  Haydee Lipa, DO [facility]  If 7PM-7AM, please contact night-coverage [email or web address]  12/21/2023, 11:44 AM

## 2023-12-21 NOTE — Plan of Care (Signed)

## 2023-12-21 NOTE — Progress Notes (Signed)
 Physical Therapy Treatment Patient Details Name: Janet Boyer MRN: [id number] DOB: 12/05/1940 Today's Date: 12/21/2023   History of Present Illness Pt is an 83 y/o F admitted on 12/14/23 after presenting with c/o R hip pain following a fall at home. Pt found to have an intratrochanteric fx with avulsion of the lesser trochanter & override with varus angulation of the distal fracture fragment. Pt is s/p IM nail on 12/15/23. PMH: HTN, HLD, depression, anxiety, trigeminal neuralgia, hyponatremia    PT Comments  Pt is progressing toward PT goals, amb 20' +12' with RW and CGA; pt remains limited by pain, fatigue and decr motivation; spouse does not feel he can manage pt at home at her current status and wishes to pursue SNF.  Pt was fairly sedentary at baseline which translates to incr time needed to rehab. Continue PT in acute setting.   If plan is discharge home, recommend the following: A little help with walking and/or transfers;A little help with bathing/dressing/bathroom;Help with stairs or ramp for entrance   Can travel by private vehicle     Yes  Equipment Recommendations   (RW if home, defer to SNF)    Recommendations for Other Services       Precautions / Restrictions Precautions Precautions: Fall Restrictions Weight Bearing Restrictions Per Provider Order: No RLE Weight Bearing Per Provider Order: Weight bearing as tolerated     Mobility  Bed Mobility               General bed mobility comments: Pt up in chair and requests back to same    Transfers Overall transfer level: Needs assistance Equipment used: Rolling walker (2 wheels) Transfers: Sit to/from Stand Sit to Stand: Contact guard assist           General transfer comment: Cues for LE management and use of UEs to self assist.  CGA  to stand to RW; pt to/from recliner and [facility]    Ambulation/Gait Ambulation/Gait assistance: Contact guard assist Gait Distance (Feet): 20 Feet (12') Assistive device:  Rolling walker (2 wheels) Gait Pattern/deviations: Step-to pattern, Decreased step length - right, Decreased step length - left, Trunk flexed Gait velocity: decr     General Gait Details: Increased time with cues for sequence, posture and position from RW.  Multiple standing rest breaks and ++encouragement. Distance ltd by pain/fatigue/ pain   Stairs             Wheelchair Mobility     Tilt Bed    Modified Rankin (Stroke Patients Only)       Balance   Sitting-balance support: Feet supported, No upper extremity supported Sitting balance-Leahy Scale: Good     Standing balance support: During functional activity, Reliant on assistive device for balance Standing balance-Leahy Scale: Poor                              Cognition Arousal: Alert Behavior During Therapy: Anxious, WFL for tasks assessed/performed Overall Cognitive Status: Impaired/Different from baseline Area of Impairment: Problem solving, Memory                     Memory: Decreased recall of precautions       Problem Solving: Slow processing, Difficulty sequencing, Requires verbal cues General Comments: requires repetitious cues, demonstrates limited carryover regarding safety awareness, techniques for safe transfers etc        Exercises General Exercises - Lower Extremity Ankle Circles/Pumps: AROM, Both, 10  reps Heel Slides: AAROM (x3, encoaurged knee and hip flexion)    General Comments        Pertinent Vitals/Pain Pain Assessment Pain Assessment: Faces Faces Pain Scale: Hurts even more Pain Location: R hip/LE Pain Descriptors / Indicators: Guarding, Grimacing, Aching, Sore Pain Intervention(s): Limited activity within patient's tolerance, Monitored during session, Repositioned, Premedicated before session    Home Living                          Prior Function            PT Goals (current goals can now be found in the care plan section) Acute Rehab  PT Goals Patient Stated Goal: decreased pain PT Goal Formulation: With patient Time For Goal Achievement: 12/30/23 Potential to Achieve Goals: Fair Progress towards PT goals: Progressing toward goals    Frequency    Min 1X/week      PT Plan      Co-evaluation              AM-PAC PT "6 Clicks" Mobility   Outcome Measure  Help needed turning from your back to your side while in a flat bed without using bedrails?: A Little Help needed moving from lying on your back to sitting on the side of a flat bed without using bedrails?: A Little Help needed moving to and from a bed to a chair (including a wheelchair)?: A Little Help needed standing up from a chair using your arms (e.g., wheelchair or bedside chair)?: A Little Help needed to walk in hospital room?: A Little Help needed climbing 3-5 steps with a railing? : A Lot 6 Click Score: 17    End of Session Equipment Utilized During Treatment: Gait belt Activity Tolerance: Patient limited by fatigue;Patient limited by pain Patient left: in chair;with call bell/phone within reach;with family/visitor present;with chair alarm set Nurse Communication: Mobility status PT Visit Diagnosis: Unsteadiness on feet (R26.81);Other abnormalities of gait and mobility (R26.89);Difficulty in walking, not elsewhere classified (R26.2);Muscle weakness (generalized) (M62.81);Pain Pain - Right/Left: Right Pain - part of body: Hip;Leg     Time: 1610-9604 PT Time Calculation (min) (ACUTE ONLY): 23 min  Charges:    $Gait Training: 23-37 mins PT General Charges $$ ACUTE PT VISIT: 1 Visit                     Syanna Remmert, PT  Acute Rehab Dept [facility]) [phone]  12/21/2023    [facility] 12/21/2023, 10:26 AM

## 2023-12-22 DIAGNOSIS — D649 Anemia, unspecified: Secondary | ICD-10-CM | POA: Diagnosis not present

## 2023-12-22 DIAGNOSIS — Z9181 History of falling: Secondary | ICD-10-CM | POA: Diagnosis not present

## 2023-12-22 DIAGNOSIS — Z7401 Bed confinement status: Secondary | ICD-10-CM | POA: Diagnosis not present

## 2023-12-22 DIAGNOSIS — R2681 Unsteadiness on feet: Secondary | ICD-10-CM | POA: Diagnosis not present

## 2023-12-22 DIAGNOSIS — R52 Pain, unspecified: Secondary | ICD-10-CM | POA: Diagnosis not present

## 2023-12-22 DIAGNOSIS — S72001A Fracture of unspecified part of neck of right femur, initial encounter for closed fracture: Secondary | ICD-10-CM | POA: Diagnosis not present

## 2023-12-22 DIAGNOSIS — G500 Trigeminal neuralgia: Secondary | ICD-10-CM | POA: Diagnosis not present

## 2023-12-22 DIAGNOSIS — F32A Depression, unspecified: Secondary | ICD-10-CM | POA: Diagnosis not present

## 2023-12-22 DIAGNOSIS — I10 Essential (primary) hypertension: Secondary | ICD-10-CM | POA: Diagnosis not present

## 2023-12-22 DIAGNOSIS — K5900 Constipation, unspecified: Secondary | ICD-10-CM | POA: Diagnosis not present

## 2023-12-22 DIAGNOSIS — R2689 Other abnormalities of gait and mobility: Secondary | ICD-10-CM | POA: Diagnosis not present

## 2023-12-22 DIAGNOSIS — S72001D Fracture of unspecified part of neck of right femur, subsequent encounter for closed fracture with routine healing: Secondary | ICD-10-CM | POA: Diagnosis not present

## 2023-12-22 DIAGNOSIS — Z8639 Personal history of other endocrine, nutritional and metabolic disease: Secondary | ICD-10-CM | POA: Diagnosis not present

## 2023-12-22 DIAGNOSIS — E871 Hypo-osmolality and hyponatremia: Secondary | ICD-10-CM | POA: Diagnosis not present

## 2023-12-22 DIAGNOSIS — S72141D Displaced intertrochanteric fracture of right femur, subsequent encounter for closed fracture with routine healing: Secondary | ICD-10-CM | POA: Diagnosis not present

## 2023-12-22 DIAGNOSIS — R41841 Cognitive communication deficit: Secondary | ICD-10-CM | POA: Diagnosis not present

## 2023-12-22 DIAGNOSIS — E785 Hyperlipidemia, unspecified: Secondary | ICD-10-CM | POA: Diagnosis not present

## 2023-12-22 DIAGNOSIS — F29 Unspecified psychosis not due to a substance or known physiological condition: Secondary | ICD-10-CM | POA: Diagnosis not present

## 2023-12-22 DIAGNOSIS — M6281 Muscle weakness (generalized): Secondary | ICD-10-CM | POA: Diagnosis not present

## 2023-12-22 MED ORDER — ACETAMINOPHEN 325 MG PO TABS
325.0000 mg | ORAL_TABLET | Freq: Four times a day (QID) | ORAL | 0 refills | Status: AC | PRN
Start: 2023-12-22 — End: ?

## 2023-12-22 MED ORDER — OXYCODONE HCL 10 MG PO TABS: 10.0000 mg | ORAL_TABLET | ORAL | 0 refills | Status: AC | PRN

## 2023-12-22 MED ORDER — ASPIRIN 81 MG PO TBEC: 81.0000 mg | DELAYED_RELEASE_TABLET | Freq: Two times a day (BID) | ORAL | 0 refills | Status: AC

## 2023-12-22 NOTE — Discharge Summary (Addendum)
Physician Discharge Summary  Janet Boyer ZOX:[id number] DOB: 25-Apr-1941 DOA: 12/14/2023  PCP: Garlan Fillers, MD  Admit date: 12/14/2023 Discharge date: 12/22/2023  Admitted From: Home Disposition:  SNF  Recommendations for Outpatient Follow-up:  Follow up with PCP in 1-2 weeks Follow up with orthopedic surgery as scheduled  Discharge Condition:Stable  CODE STATUS:Full  Diet recommendation: As tolerated low fat diet    Brief/Interim Summary: 83 year old female with HTN, HLD, depression, chronic hyponatremia who comes into the hospital with right hip pain after a fall at home.  Hospitalist called for admission, orthopedics called in consult due to right intertrochanteric femur fracture.  Status post repair with IM nail on 12/15/2023. Hydrochlorothiazide stopped in the setting of hypotension and hyponatremia(chronic) - blood pressure improved but sodium remains borderline low.   Patient unfortunately not stable for discharge home, now agreeable for discharge to SNF.  Bed authorization received 12/22/23 - otherwise stable for discharge to SNF.  Patient to discharge on pain control per orthopedics including oxycodone.  Discharge Diagnoses:  Principal Problem:   Closed right hip fracture, initial encounter [facility]) Active Problems:   Hypertension   Hyponatremia   Trigeminal neuralgia   Hypokalemia  Right hip fracture after mechanical fall  -Status post right intertrochanteric intramedullary nail 12/15/2023 -Appreciate orthopedic insight and recommendations - follow up per their schedule -Pain control ongoing - off narcotics   Asymptomatic hyponatremia Chronic -Questionably in the setting of hydrochlorothiazide -HCTZ held, labs stable high 120s despite medication discontinuation - noted hyponatremia 3 years ago, this may be her baseline -Discontinue triamterene/HCTZ in the setting of hypotension/normotension, follow-up with PCP in the next few weeks to discuss replacing with  alternative medication if necessary   Anemia - Mild, no bleeding, likely in the setting of chronic disease Hypokalemia - Replace potassium Leukocytosis - Likely reactive in the setting of hip fracture, resolved Essential hypertension - Continue metoprolol -discontinue HCTZ Hyperlipidemia - Continue statin   Discharge Instructions  Discharge Instructions     Call MD for:  difficulty breathing, headache or visual disturbances   Complete by: As directed    Call MD for:  extreme fatigue   Complete by: As directed    Call MD for:  persistant dizziness or light-headedness   Complete by: As directed    Call MD for:  persistant nausea and vomiting   Complete by: As directed    Call MD for:  redness, tenderness, or signs of infection (pain, swelling, redness, odor or green/yellow discharge around incision site)   Complete by: As directed    Call MD for:  severe uncontrolled pain   Complete by: As directed    Call MD for:  temperature >100.4   Complete by: As directed    Diet - low sodium heart healthy   Complete by: As directed    Increase activity slowly   Complete by: As directed       Allergies as of 12/22/2023       Reactions   Other    Strawberries tomatoes        Medication List     STOP taking these medications    triamterene-hydrochlorothiazide 37.5-25 MG tablet Commonly known as: MAXZIDE-25       TAKE these medications    acetaminophen 325 MG tablet Commonly known as: TYLENOL Take 1-2 tablets (325-650 mg total) by mouth every 6 (six) hours as needed for mild pain (pain score 1-3) (or temp > 100.5).   aspirin EC 81 MG tablet Take 1 tablet (  81 mg total) by mouth 2 (two) times daily. Swallow whole.   carbamazepine 200 MG tablet Commonly known as: TEGRETOL Take 50-100 mg by mouth 2 (two) times daily as needed (for trigeminal neuralgia). Take 1/4 tablet in the morning and 1/2 tablet in the evening.   lovastatin 40 MG tablet Commonly known as:  MEVACOR Take 40 mg by mouth daily. Pt takes after eats a meal in the am   metoprolol succinate 50 MG 24 hr tablet Commonly known as: TOPROL-XL Take 50 mg by mouth daily. Take with or immediately following a meal.   Oxycodone HCl 10 MG Tabs Take 1 tablet (10 mg total) by mouth every 4 (four) hours as needed for up to 5 days for severe pain (pain score 7-10).        Contact information for follow-up providers     Sheral Apley, MD. Schedule an appointment as soon as possible for a visit in 2 week(s).   Specialty: Orthopedic Surgery Contact information: [address] [place] [address] [phone]         Garlan Fillers, MD Follow up.   Specialty: Internal Medicine Contact information: [address] [place] [address] [phone]              Contact information for after-discharge care     Destination     [facility], [facility] Preferred SNF .   Service: Skilled Nursing Contact information: [address] [place] [address] [phone]                    Allergies  Allergen Reactions   Other     Strawberries tomatoes    Consultations: Ortho   Procedures/Studies: DG HIP UNILAT WITH PELVIS 2-3 VIEWS RIGHT Result Date: 12/15/2023 CLINICAL DATA:  Elective surgery. EXAM: DG HIP (WITH OR WITHOUT PELVIS) 2-3V RIGHT COMPARISON:  Radiograph yesterday FINDINGS: Two fluoroscopic spot views submitted from the operating room. Femoral intramedullary nail with trans trochanteric and distal locking screw traverse comminuted proximal femur fracture. Fluoroscopy time 58 seconds. Dose 7.6 mGy. IMPRESSION: Intraoperative fluoroscopy during proximal femur fracture fixation. Electronically Signed   By: Narda Rutherford M.D.   On: 12/15/2023 15:13   DG C-Arm 1-60 Min-No Report Result Date: 12/15/2023 Fluoroscopy was utilized by the requesting physician.  No radiographic interpretation.   DG  HIP UNILAT WITH PELVIS 2-3 VIEWS RIGHT Result Date: 12/15/2023 CLINICAL DATA:  Right hip pain EXAM: DG HIP (WITH OR WITHOUT PELVIS) 2-3V RIGHT COMPARISON:  None Available. FINDINGS: There is an acute intratrochanteric fracture of the right hip with avulsion of the lesser trochanter and override with varus angulation of the distal fracture fragment. Femoral head is still seated within the right acetabulum. Superimposed mild to moderate bilateral degenerative hip arthritis with joint space narrowing. Pelvis and limited view of the left hip are intact. L4-5 lumbar fusion with instrumentation has been performed. IMPRESSION: 1. Acute intratrochanteric fracture of the right hip. Electronically Signed   By: Helyn Numbers M.D.   On: 12/15/2023 00:07   DG Chest Port 1 View Result Date: 12/15/2023 CLINICAL DATA:  Right hip pain after a fall. EXAM: PORTABLE CHEST 1 VIEW COMPARISON:  12/22/2013 FINDINGS: Heart size and pulmonary vascularity are normal. Lungs are clear. No pleural effusion. No pneumothorax. Mediastinal contours appear intact. Postoperative reverse left shoulder arthroplasty. IMPRESSION: No active disease. Electronically Signed   By: Burman Nieves M.D.   On: 12/15/2023 00:06     Subjective:  No acute issues/events overnight   Discharge Exam: Vitals:   12/22/23 0547 12/22/23 1340  BP: (!) 102/58 (!) 126/48  Pulse: 63 62  Resp: 17 18  Temp: 98.7 F (37.1 C) 99.2 F (37.3 C)  SpO2: 94% 96%   Vitals:   12/21/23 1404 12/21/23 2115 12/22/23 0547 12/22/23 1340  BP: (!) 112/51 (!) 119/48 (!) 102/58 (!) 126/48  Pulse: (!) 58 61 63 62  Resp: 15 17 17 18   Temp: 98.2 F (36.8 C) 97.8 F (36.6 C) 98.7 F (37.1 C) 99.2 F (37.3 C)  TempSrc:  Oral Oral Oral  SpO2: 94% 95% 94% 96%  Weight:      Height:       Physical exam as per my partner Dr. Natale Milch who saw the patient today:   General: Pt is alert, awake, not in acute distress Cardiovascular: RRR, S1/S2 +, no rubs, no  gallops Respiratory: CTA bilaterally, no wheezing, no rhonchi Abdominal: Soft, NT, ND, bowel sounds + Extremities: no edema, no cyanosis    The results of significant diagnostics from this hospitalization (including imaging, microbiology, ancillary and laboratory) are listed below for reference.     Microbiology: Recent Results (from the past 240 hours)  Surgical pcr screen     Status: None   Collection Time: 12/15/23 11:36 AM   Specimen: Nasal Mucosa; Nasal Swab  Result Value Ref Range Status   MRSA, PCR NEGATIVE NEGATIVE Final   Staphylococcus aureus NEGATIVE NEGATIVE Final    Comment: (NOTE) The Xpert SA Assay (FDA approved for NASAL specimens in patients 75 years of age and older), is one component of a comprehensive surveillance program. It is not intended to diagnose infection nor to guide or monitor treatment. Performed at [facility], [address] [place], [place] [address]      Labs: BNP (last 3 results) No results for input(s): "BNP" in the last 8760 hours. Basic Metabolic Panel: Recent Labs  Lab 12/16/23 0332 12/17/23 0328  NA 127* 128*  K 3.9 3.5  CL 97* 97*  CO2 21* 21*  GLUCOSE 139* 115*  BUN 12 15  CREATININE 0.58 0.67  CALCIUM 7.8* 7.7*   Liver Function Tests: No results for input(s): "AST", "ALT", "ALKPHOS", "BILITOT", "PROT", "ALBUMIN" in the last 168 hours. No results for input(s): "LIPASE", "AMYLASE" in the last 168 hours. No results for input(s): "AMMONIA" in the last 168 hours. CBC: Recent Labs  Lab 12/16/23 0332 12/17/23 0328  WBC 6.9 6.7  HGB 8.3* 7.8*  HCT 25.4* 23.6*  MCV 100.0 100.0  PLT 154 133*   Cardiac Enzymes: No results for input(s): "CKTOTAL", "CKMB", "CKMBINDEX", "TROPONINI" in the last 168 hours. BNP: Invalid input(s): "POCBNP" CBG: No results for input(s): "GLUCAP" in the last 168 hours. D-Dimer No results for input(s): "DDIMER" in the last 72 hours. Hgb A1c No results for input(s):  "HGBA1C" in the last 72 hours. Lipid Profile No results for input(s): "CHOL", "HDL", "LDLCALC", "TRIG", "CHOLHDL", "LDLDIRECT" in the last 72 hours. Thyroid function studies No results for input(s): "TSH", "T4TOTAL", "T3FREE", "THYROIDAB" in the last 72 hours.  Invalid input(s): "FREET3" Anemia work up No results for input(s): "VITAMINB12", "FOLATE", "FERRITIN", "TIBC", "IRON", "RETICCTPCT" in the last 72 hours. Urinalysis No results found for: "COLORURINE", "APPEARANCEUR", "LABSPEC", "PHURINE", "GLUCOSEU", "HGBUR", "BILIRUBINUR", "KETONESUR", "PROTEINUR", "UROBILINOGEN", "NITRITE", "LEUKOCYTESUR" Sepsis Labs Recent Labs  Lab 12/16/23 0332 12/17/23 0328  WBC 6.9 6.7   Microbiology Recent Results (from the past 240 hours)  Surgical pcr screen  Status: None   Collection Time: 12/15/23 11:36 AM   Specimen: Nasal Mucosa; Nasal Swab  Result Value Ref Range Status   MRSA, PCR NEGATIVE NEGATIVE Final   Staphylococcus aureus NEGATIVE NEGATIVE Final    Comment: (NOTE) The Xpert SA Assay (FDA approved for NASAL specimens in patients 53 years of age and older), is one component of a comprehensive surveillance program. It is not intended to diagnose infection nor to guide or monitor treatment. Performed at [facility], [address], [place], [place] [address]      Time coordinating discharge: Over 30 minutes  SIGNED:   Marinda Elk, DO [facility] 12/22/2023, 2:57 PM Pager   If 7PM-7AM, please contact night-coverage [email or web address]

## 2023-12-22 NOTE — Plan of Care (Signed)
?  Problem: Health Behavior/Discharge Planning: ?Goal: Ability to manage health-related needs will improve ?Outcome: Adequate for Discharge ?  ?Problem: Clinical Measurements: ?Goal: Ability to maintain clinical measurements within normal limits will improve ?Outcome: Adequate for Discharge ?Goal: Will remain free from infection ?Outcome: Adequate for Discharge ?Goal: Diagnostic test results will improve ?Outcome: Adequate for Discharge ?Goal: Respiratory complications will improve ?Outcome: Adequate for Discharge ?Goal: Cardiovascular complication will be avoided ?Outcome: Adequate for Discharge ?  ?

## 2023-12-22 NOTE — TOC Transition Note (Addendum)
Transition of Care [facility]) - Discharge Note   Patient Details  Name: Janet Boyer MRN: [id number] Date of Birth: 03-02-1941  Transition of Care [facility]) CM/SW Contact:  Amada Jupiter, LCSW Phone Number: 12/22/2023, 11:31 AM   Clinical Narrative:     Met with pt and spouse to get SNF choice - they would like [facility].  Confirmed pt medically ready and [facility] able to admit pt today.  Secured English as a second language teacher [phone]).  PTAR called at 3:10pm.  RN to call report to [phone].  No further TOC needs.  Final next level of care: Skilled Nursing Facility Barriers to Discharge: Barriers Resolved   Patient Goals and CMS Choice Patient states their goals for this hospitalization and ongoing recovery are:: Go home CMS Medicare.gov Compare Post Acute Care list provided to:: Patient Choice offered to / list presented to : Patient      Discharge Placement PASRR number recieved: 12/20/23            Patient chooses bed at: [facility] Patient to be transferred to facility by: PTAR Name of family member notified: spouse    Discharge Plan and Services Additional resources added to the After Visit Summary for                                       Social Drivers of Health (SDOH) Interventions SDOH Screenings   Food Insecurity: No Food Insecurity (12/15/2023)  Housing: Low Risk  (12/15/2023)  Transportation Needs: No Transportation Needs (12/15/2023)  Utilities: Not At Risk (12/15/2023)  Social Connections: Socially Integrated (12/15/2023)  Tobacco Use: Low Risk  (12/15/2023)     Readmission Risk Interventions    12/22/2023   11:30 AM  Readmission Risk Prevention Plan  Post Dischage Appt Complete  Medication Screening Complete  Transportation Screening Complete

## 2023-12-24 DIAGNOSIS — E785 Hyperlipidemia, unspecified: Secondary | ICD-10-CM | POA: Diagnosis not present

## 2023-12-24 DIAGNOSIS — I10 Essential (primary) hypertension: Secondary | ICD-10-CM | POA: Diagnosis not present

## 2023-12-24 DIAGNOSIS — E871 Hypo-osmolality and hyponatremia: Secondary | ICD-10-CM | POA: Diagnosis not present

## 2023-12-24 DIAGNOSIS — D649 Anemia, unspecified: Secondary | ICD-10-CM | POA: Diagnosis not present

## 2023-12-24 DIAGNOSIS — R52 Pain, unspecified: Secondary | ICD-10-CM | POA: Diagnosis not present

## 2023-12-24 DIAGNOSIS — F32A Depression, unspecified: Secondary | ICD-10-CM | POA: Diagnosis not present

## 2023-12-24 DIAGNOSIS — S72141D Displaced intertrochanteric fracture of right femur, subsequent encounter for closed fracture with routine healing: Secondary | ICD-10-CM | POA: Diagnosis not present

## 2023-12-24 DIAGNOSIS — Z9181 History of falling: Secondary | ICD-10-CM | POA: Diagnosis not present

## 2023-12-24 DIAGNOSIS — K5900 Constipation, unspecified: Secondary | ICD-10-CM | POA: Diagnosis not present

## 2023-12-24 DIAGNOSIS — M6281 Muscle weakness (generalized): Secondary | ICD-10-CM | POA: Diagnosis not present

## 2023-12-24 DIAGNOSIS — R2681 Unsteadiness on feet: Secondary | ICD-10-CM | POA: Diagnosis not present

## 2023-12-24 DIAGNOSIS — G500 Trigeminal neuralgia: Secondary | ICD-10-CM | POA: Diagnosis not present

## 2023-12-28 DIAGNOSIS — M6281 Muscle weakness (generalized): Secondary | ICD-10-CM | POA: Diagnosis not present

## 2023-12-28 DIAGNOSIS — Z9181 History of falling: Secondary | ICD-10-CM | POA: Diagnosis not present

## 2023-12-28 DIAGNOSIS — S72141D Displaced intertrochanteric fracture of right femur, subsequent encounter for closed fracture with routine healing: Secondary | ICD-10-CM | POA: Diagnosis not present

## 2023-12-28 DIAGNOSIS — R52 Pain, unspecified: Secondary | ICD-10-CM | POA: Diagnosis not present

## 2023-12-28 DIAGNOSIS — R2681 Unsteadiness on feet: Secondary | ICD-10-CM | POA: Diagnosis not present

## 2023-12-28 DIAGNOSIS — K5900 Constipation, unspecified: Secondary | ICD-10-CM | POA: Diagnosis not present

## 2023-12-30 DIAGNOSIS — S72141D Displaced intertrochanteric fracture of right femur, subsequent encounter for closed fracture with routine healing: Secondary | ICD-10-CM | POA: Diagnosis not present

## 2023-12-30 DIAGNOSIS — K5900 Constipation, unspecified: Secondary | ICD-10-CM | POA: Diagnosis not present

## 2023-12-30 DIAGNOSIS — R2681 Unsteadiness on feet: Secondary | ICD-10-CM | POA: Diagnosis not present

## 2023-12-30 DIAGNOSIS — M6281 Muscle weakness (generalized): Secondary | ICD-10-CM | POA: Diagnosis not present

## 2023-12-30 DIAGNOSIS — Z9181 History of falling: Secondary | ICD-10-CM | POA: Diagnosis not present

## 2023-12-30 DIAGNOSIS — R52 Pain, unspecified: Secondary | ICD-10-CM | POA: Diagnosis not present

## 2024-01-01 DIAGNOSIS — Z8639 Personal history of other endocrine, nutritional and metabolic disease: Secondary | ICD-10-CM | POA: Diagnosis not present

## 2024-01-01 DIAGNOSIS — Z9181 History of falling: Secondary | ICD-10-CM | POA: Diagnosis not present

## 2024-01-01 DIAGNOSIS — I10 Essential (primary) hypertension: Secondary | ICD-10-CM | POA: Diagnosis not present

## 2024-01-01 DIAGNOSIS — G500 Trigeminal neuralgia: Secondary | ICD-10-CM | POA: Diagnosis not present

## 2024-01-01 DIAGNOSIS — E871 Hypo-osmolality and hyponatremia: Secondary | ICD-10-CM | POA: Diagnosis not present

## 2024-01-01 DIAGNOSIS — E785 Hyperlipidemia, unspecified: Secondary | ICD-10-CM | POA: Diagnosis not present

## 2024-01-01 DIAGNOSIS — F32A Depression, unspecified: Secondary | ICD-10-CM | POA: Diagnosis not present

## 2024-01-01 DIAGNOSIS — D649 Anemia, unspecified: Secondary | ICD-10-CM | POA: Diagnosis not present

## 2024-01-01 DIAGNOSIS — S72001D Fracture of unspecified part of neck of right femur, subsequent encounter for closed fracture with routine healing: Secondary | ICD-10-CM | POA: Diagnosis not present

## 2024-01-03 DIAGNOSIS — I10 Essential (primary) hypertension: Secondary | ICD-10-CM | POA: Diagnosis not present

## 2024-01-03 DIAGNOSIS — G500 Trigeminal neuralgia: Secondary | ICD-10-CM | POA: Diagnosis not present

## 2024-01-03 DIAGNOSIS — E785 Hyperlipidemia, unspecified: Secondary | ICD-10-CM | POA: Diagnosis not present

## 2024-01-03 DIAGNOSIS — G8929 Other chronic pain: Secondary | ICD-10-CM | POA: Diagnosis not present

## 2024-01-03 DIAGNOSIS — D649 Anemia, unspecified: Secondary | ICD-10-CM | POA: Diagnosis not present

## 2024-01-03 DIAGNOSIS — S72141D Displaced intertrochanteric fracture of right femur, subsequent encounter for closed fracture with routine healing: Secondary | ICD-10-CM | POA: Diagnosis not present

## 2024-01-03 DIAGNOSIS — M1990 Unspecified osteoarthritis, unspecified site: Secondary | ICD-10-CM | POA: Diagnosis not present

## 2024-01-03 DIAGNOSIS — E871 Hypo-osmolality and hyponatremia: Secondary | ICD-10-CM | POA: Diagnosis not present

## 2024-01-03 DIAGNOSIS — M549 Dorsalgia, unspecified: Secondary | ICD-10-CM | POA: Diagnosis not present

## 2024-01-04 DIAGNOSIS — D649 Anemia, unspecified: Secondary | ICD-10-CM | POA: Diagnosis not present

## 2024-01-04 DIAGNOSIS — I10 Essential (primary) hypertension: Secondary | ICD-10-CM | POA: Diagnosis not present

## 2024-01-04 DIAGNOSIS — G8929 Other chronic pain: Secondary | ICD-10-CM | POA: Diagnosis not present

## 2024-01-04 DIAGNOSIS — G500 Trigeminal neuralgia: Secondary | ICD-10-CM | POA: Diagnosis not present

## 2024-01-04 DIAGNOSIS — S72141D Displaced intertrochanteric fracture of right femur, subsequent encounter for closed fracture with routine healing: Secondary | ICD-10-CM | POA: Diagnosis not present

## 2024-01-04 DIAGNOSIS — M1990 Unspecified osteoarthritis, unspecified site: Secondary | ICD-10-CM | POA: Diagnosis not present

## 2024-01-04 DIAGNOSIS — E871 Hypo-osmolality and hyponatremia: Secondary | ICD-10-CM | POA: Diagnosis not present

## 2024-01-04 DIAGNOSIS — E785 Hyperlipidemia, unspecified: Secondary | ICD-10-CM | POA: Diagnosis not present

## 2024-01-04 DIAGNOSIS — M549 Dorsalgia, unspecified: Secondary | ICD-10-CM | POA: Diagnosis not present

## 2024-01-06 DIAGNOSIS — G500 Trigeminal neuralgia: Secondary | ICD-10-CM | POA: Diagnosis not present

## 2024-01-06 DIAGNOSIS — S72141D Displaced intertrochanteric fracture of right femur, subsequent encounter for closed fracture with routine healing: Secondary | ICD-10-CM | POA: Diagnosis not present

## 2024-01-06 DIAGNOSIS — D649 Anemia, unspecified: Secondary | ICD-10-CM | POA: Diagnosis not present

## 2024-01-06 DIAGNOSIS — G8929 Other chronic pain: Secondary | ICD-10-CM | POA: Diagnosis not present

## 2024-01-06 DIAGNOSIS — M1990 Unspecified osteoarthritis, unspecified site: Secondary | ICD-10-CM | POA: Diagnosis not present

## 2024-01-06 DIAGNOSIS — E785 Hyperlipidemia, unspecified: Secondary | ICD-10-CM | POA: Diagnosis not present

## 2024-01-06 DIAGNOSIS — I10 Essential (primary) hypertension: Secondary | ICD-10-CM | POA: Diagnosis not present

## 2024-01-06 DIAGNOSIS — E871 Hypo-osmolality and hyponatremia: Secondary | ICD-10-CM | POA: Diagnosis not present

## 2024-01-06 DIAGNOSIS — M549 Dorsalgia, unspecified: Secondary | ICD-10-CM | POA: Diagnosis not present

## 2024-01-08 DIAGNOSIS — G500 Trigeminal neuralgia: Secondary | ICD-10-CM | POA: Diagnosis not present

## 2024-01-08 DIAGNOSIS — E785 Hyperlipidemia, unspecified: Secondary | ICD-10-CM | POA: Diagnosis not present

## 2024-01-08 DIAGNOSIS — E871 Hypo-osmolality and hyponatremia: Secondary | ICD-10-CM | POA: Diagnosis not present

## 2024-01-08 DIAGNOSIS — M1990 Unspecified osteoarthritis, unspecified site: Secondary | ICD-10-CM | POA: Diagnosis not present

## 2024-01-08 DIAGNOSIS — M549 Dorsalgia, unspecified: Secondary | ICD-10-CM | POA: Diagnosis not present

## 2024-01-08 DIAGNOSIS — I10 Essential (primary) hypertension: Secondary | ICD-10-CM | POA: Diagnosis not present

## 2024-01-08 DIAGNOSIS — D649 Anemia, unspecified: Secondary | ICD-10-CM | POA: Diagnosis not present

## 2024-01-08 DIAGNOSIS — G8929 Other chronic pain: Secondary | ICD-10-CM | POA: Diagnosis not present

## 2024-01-08 DIAGNOSIS — S72141D Displaced intertrochanteric fracture of right femur, subsequent encounter for closed fracture with routine healing: Secondary | ICD-10-CM | POA: Diagnosis not present

## 2024-01-11 DIAGNOSIS — G8929 Other chronic pain: Secondary | ICD-10-CM | POA: Diagnosis not present

## 2024-01-11 DIAGNOSIS — E785 Hyperlipidemia, unspecified: Secondary | ICD-10-CM | POA: Diagnosis not present

## 2024-01-11 DIAGNOSIS — E871 Hypo-osmolality and hyponatremia: Secondary | ICD-10-CM | POA: Diagnosis not present

## 2024-01-11 DIAGNOSIS — M1990 Unspecified osteoarthritis, unspecified site: Secondary | ICD-10-CM | POA: Diagnosis not present

## 2024-01-11 DIAGNOSIS — G500 Trigeminal neuralgia: Secondary | ICD-10-CM | POA: Diagnosis not present

## 2024-01-11 DIAGNOSIS — D649 Anemia, unspecified: Secondary | ICD-10-CM | POA: Diagnosis not present

## 2024-01-11 DIAGNOSIS — M549 Dorsalgia, unspecified: Secondary | ICD-10-CM | POA: Diagnosis not present

## 2024-01-11 DIAGNOSIS — I10 Essential (primary) hypertension: Secondary | ICD-10-CM | POA: Diagnosis not present

## 2024-01-11 DIAGNOSIS — S72141D Displaced intertrochanteric fracture of right femur, subsequent encounter for closed fracture with routine healing: Secondary | ICD-10-CM | POA: Diagnosis not present

## 2024-01-14 DIAGNOSIS — M549 Dorsalgia, unspecified: Secondary | ICD-10-CM | POA: Diagnosis not present

## 2024-01-14 DIAGNOSIS — S72141D Displaced intertrochanteric fracture of right femur, subsequent encounter for closed fracture with routine healing: Secondary | ICD-10-CM | POA: Diagnosis not present

## 2024-01-14 DIAGNOSIS — E871 Hypo-osmolality and hyponatremia: Secondary | ICD-10-CM | POA: Diagnosis not present

## 2024-01-14 DIAGNOSIS — G500 Trigeminal neuralgia: Secondary | ICD-10-CM | POA: Diagnosis not present

## 2024-01-14 DIAGNOSIS — G8929 Other chronic pain: Secondary | ICD-10-CM | POA: Diagnosis not present

## 2024-01-14 DIAGNOSIS — E785 Hyperlipidemia, unspecified: Secondary | ICD-10-CM | POA: Diagnosis not present

## 2024-01-14 DIAGNOSIS — D649 Anemia, unspecified: Secondary | ICD-10-CM | POA: Diagnosis not present

## 2024-01-14 DIAGNOSIS — I10 Essential (primary) hypertension: Secondary | ICD-10-CM | POA: Diagnosis not present

## 2024-01-14 DIAGNOSIS — M1990 Unspecified osteoarthritis, unspecified site: Secondary | ICD-10-CM | POA: Diagnosis not present

## 2024-01-19 DIAGNOSIS — D649 Anemia, unspecified: Secondary | ICD-10-CM | POA: Diagnosis not present

## 2024-01-19 DIAGNOSIS — S72141D Displaced intertrochanteric fracture of right femur, subsequent encounter for closed fracture with routine healing: Secondary | ICD-10-CM | POA: Diagnosis not present

## 2024-01-19 DIAGNOSIS — E871 Hypo-osmolality and hyponatremia: Secondary | ICD-10-CM | POA: Diagnosis not present

## 2024-01-19 DIAGNOSIS — M549 Dorsalgia, unspecified: Secondary | ICD-10-CM | POA: Diagnosis not present

## 2024-01-19 DIAGNOSIS — I10 Essential (primary) hypertension: Secondary | ICD-10-CM | POA: Diagnosis not present

## 2024-01-19 DIAGNOSIS — M1990 Unspecified osteoarthritis, unspecified site: Secondary | ICD-10-CM | POA: Diagnosis not present

## 2024-01-19 DIAGNOSIS — G500 Trigeminal neuralgia: Secondary | ICD-10-CM | POA: Diagnosis not present

## 2024-01-19 DIAGNOSIS — E785 Hyperlipidemia, unspecified: Secondary | ICD-10-CM | POA: Diagnosis not present

## 2024-01-19 DIAGNOSIS — G8929 Other chronic pain: Secondary | ICD-10-CM | POA: Diagnosis not present

## 2024-01-20 DIAGNOSIS — E785 Hyperlipidemia, unspecified: Secondary | ICD-10-CM | POA: Diagnosis not present

## 2024-01-20 DIAGNOSIS — S72141D Displaced intertrochanteric fracture of right femur, subsequent encounter for closed fracture with routine healing: Secondary | ICD-10-CM | POA: Diagnosis not present

## 2024-01-20 DIAGNOSIS — M549 Dorsalgia, unspecified: Secondary | ICD-10-CM | POA: Diagnosis not present

## 2024-01-20 DIAGNOSIS — G500 Trigeminal neuralgia: Secondary | ICD-10-CM | POA: Diagnosis not present

## 2024-01-20 DIAGNOSIS — M1990 Unspecified osteoarthritis, unspecified site: Secondary | ICD-10-CM | POA: Diagnosis not present

## 2024-01-20 DIAGNOSIS — G8929 Other chronic pain: Secondary | ICD-10-CM | POA: Diagnosis not present

## 2024-01-20 DIAGNOSIS — D649 Anemia, unspecified: Secondary | ICD-10-CM | POA: Diagnosis not present

## 2024-01-20 DIAGNOSIS — E871 Hypo-osmolality and hyponatremia: Secondary | ICD-10-CM | POA: Diagnosis not present

## 2024-01-20 DIAGNOSIS — I10 Essential (primary) hypertension: Secondary | ICD-10-CM | POA: Diagnosis not present

## 2024-01-22 DIAGNOSIS — G500 Trigeminal neuralgia: Secondary | ICD-10-CM | POA: Diagnosis not present

## 2024-01-22 DIAGNOSIS — E785 Hyperlipidemia, unspecified: Secondary | ICD-10-CM | POA: Diagnosis not present

## 2024-01-22 DIAGNOSIS — D649 Anemia, unspecified: Secondary | ICD-10-CM | POA: Diagnosis not present

## 2024-01-22 DIAGNOSIS — S72141D Displaced intertrochanteric fracture of right femur, subsequent encounter for closed fracture with routine healing: Secondary | ICD-10-CM | POA: Diagnosis not present

## 2024-01-22 DIAGNOSIS — M549 Dorsalgia, unspecified: Secondary | ICD-10-CM | POA: Diagnosis not present

## 2024-01-22 DIAGNOSIS — M1990 Unspecified osteoarthritis, unspecified site: Secondary | ICD-10-CM | POA: Diagnosis not present

## 2024-01-22 DIAGNOSIS — I10 Essential (primary) hypertension: Secondary | ICD-10-CM | POA: Diagnosis not present

## 2024-01-22 DIAGNOSIS — G8929 Other chronic pain: Secondary | ICD-10-CM | POA: Diagnosis not present

## 2024-01-22 DIAGNOSIS — E871 Hypo-osmolality and hyponatremia: Secondary | ICD-10-CM | POA: Diagnosis not present

## 2024-01-25 DIAGNOSIS — E785 Hyperlipidemia, unspecified: Secondary | ICD-10-CM | POA: Diagnosis not present

## 2024-01-25 DIAGNOSIS — E871 Hypo-osmolality and hyponatremia: Secondary | ICD-10-CM | POA: Diagnosis not present

## 2024-01-25 DIAGNOSIS — I10 Essential (primary) hypertension: Secondary | ICD-10-CM | POA: Diagnosis not present

## 2024-01-25 DIAGNOSIS — M549 Dorsalgia, unspecified: Secondary | ICD-10-CM | POA: Diagnosis not present

## 2024-01-25 DIAGNOSIS — D649 Anemia, unspecified: Secondary | ICD-10-CM | POA: Diagnosis not present

## 2024-01-25 DIAGNOSIS — G8929 Other chronic pain: Secondary | ICD-10-CM | POA: Diagnosis not present

## 2024-01-25 DIAGNOSIS — M1990 Unspecified osteoarthritis, unspecified site: Secondary | ICD-10-CM | POA: Diagnosis not present

## 2024-01-25 DIAGNOSIS — G500 Trigeminal neuralgia: Secondary | ICD-10-CM | POA: Diagnosis not present

## 2024-01-25 DIAGNOSIS — S72141D Displaced intertrochanteric fracture of right femur, subsequent encounter for closed fracture with routine healing: Secondary | ICD-10-CM | POA: Diagnosis not present

## 2024-01-28 DIAGNOSIS — M549 Dorsalgia, unspecified: Secondary | ICD-10-CM | POA: Diagnosis not present

## 2024-01-28 DIAGNOSIS — I10 Essential (primary) hypertension: Secondary | ICD-10-CM | POA: Diagnosis not present

## 2024-01-28 DIAGNOSIS — G500 Trigeminal neuralgia: Secondary | ICD-10-CM | POA: Diagnosis not present

## 2024-01-28 DIAGNOSIS — G8929 Other chronic pain: Secondary | ICD-10-CM | POA: Diagnosis not present

## 2024-01-28 DIAGNOSIS — M1990 Unspecified osteoarthritis, unspecified site: Secondary | ICD-10-CM | POA: Diagnosis not present

## 2024-01-28 DIAGNOSIS — E785 Hyperlipidemia, unspecified: Secondary | ICD-10-CM | POA: Diagnosis not present

## 2024-01-28 DIAGNOSIS — D649 Anemia, unspecified: Secondary | ICD-10-CM | POA: Diagnosis not present

## 2024-01-28 DIAGNOSIS — E871 Hypo-osmolality and hyponatremia: Secondary | ICD-10-CM | POA: Diagnosis not present

## 2024-01-28 DIAGNOSIS — S72141D Displaced intertrochanteric fracture of right femur, subsequent encounter for closed fracture with routine healing: Secondary | ICD-10-CM | POA: Diagnosis not present

## 2024-03-28 DIAGNOSIS — S72141D Displaced intertrochanteric fracture of right femur, subsequent encounter for closed fracture with routine healing: Secondary | ICD-10-CM | POA: Diagnosis not present

## 2024-06-02 DIAGNOSIS — R739 Hyperglycemia, unspecified: Secondary | ICD-10-CM | POA: Diagnosis not present

## 2024-06-02 DIAGNOSIS — I10 Essential (primary) hypertension: Secondary | ICD-10-CM | POA: Diagnosis not present

## 2024-06-02 DIAGNOSIS — E785 Hyperlipidemia, unspecified: Secondary | ICD-10-CM | POA: Diagnosis not present

## 2024-06-09 DIAGNOSIS — G500 Trigeminal neuralgia: Secondary | ICD-10-CM | POA: Diagnosis not present

## 2024-06-09 DIAGNOSIS — H9193 Unspecified hearing loss, bilateral: Secondary | ICD-10-CM | POA: Diagnosis not present

## 2024-06-09 DIAGNOSIS — Z1331 Encounter for screening for depression: Secondary | ICD-10-CM | POA: Diagnosis not present

## 2024-06-09 DIAGNOSIS — E785 Hyperlipidemia, unspecified: Secondary | ICD-10-CM | POA: Diagnosis not present

## 2024-06-09 DIAGNOSIS — E871 Hypo-osmolality and hyponatremia: Secondary | ICD-10-CM | POA: Diagnosis not present

## 2024-06-09 DIAGNOSIS — Z0000 Encounter for general adult medical examination without abnormal findings: Secondary | ICD-10-CM | POA: Diagnosis not present

## 2024-06-09 DIAGNOSIS — F1020 Alcohol dependence, uncomplicated: Secondary | ICD-10-CM | POA: Diagnosis not present

## 2024-06-09 DIAGNOSIS — R82998 Other abnormal findings in urine: Secondary | ICD-10-CM | POA: Diagnosis not present

## 2024-06-09 DIAGNOSIS — R2681 Unsteadiness on feet: Secondary | ICD-10-CM | POA: Diagnosis not present

## 2024-06-09 DIAGNOSIS — Z1339 Encounter for screening examination for other mental health and behavioral disorders: Secondary | ICD-10-CM | POA: Diagnosis not present

## 2024-06-09 DIAGNOSIS — I10 Essential (primary) hypertension: Secondary | ICD-10-CM | POA: Diagnosis not present

## 2024-08-23 DIAGNOSIS — Z23 Encounter for immunization: Secondary | ICD-10-CM | POA: Diagnosis not present

## 2024-08-29 ENCOUNTER — Inpatient Hospital Stay (HOSPITAL_COMMUNITY)
Admission: EM | Admit: 2024-08-29 | Discharge: 2024-09-05 | DRG: 552 | Disposition: A | Discharge status: Skilled Nursing Facility | Attending: Internal Medicine | Admitting: Internal Medicine

## 2024-08-29 ENCOUNTER — Emergency Department (HOSPITAL_COMMUNITY)

## 2024-08-29 ENCOUNTER — Other Ambulatory Visit: Payer: Self-pay

## 2024-08-29 ENCOUNTER — Encounter (HOSPITAL_COMMUNITY): Payer: Self-pay

## 2024-08-29 DIAGNOSIS — F32A Depression, unspecified: Secondary | ICD-10-CM | POA: Diagnosis present

## 2024-08-29 DIAGNOSIS — E871 Hypo-osmolality and hyponatremia: Secondary | ICD-10-CM | POA: Diagnosis present

## 2024-08-29 DIAGNOSIS — S3210XA Unspecified fracture of sacrum, initial encounter for closed fracture: Secondary | ICD-10-CM | POA: Diagnosis present

## 2024-08-29 DIAGNOSIS — G500 Trigeminal neuralgia: Secondary | ICD-10-CM | POA: Diagnosis present

## 2024-08-29 DIAGNOSIS — R2689 Other abnormalities of gait and mobility: Secondary | ICD-10-CM | POA: Diagnosis not present

## 2024-08-29 DIAGNOSIS — Z96612 Presence of left artificial shoulder joint: Secondary | ICD-10-CM | POA: Diagnosis present

## 2024-08-29 DIAGNOSIS — M1990 Unspecified osteoarthritis, unspecified site: Secondary | ICD-10-CM | POA: Diagnosis present

## 2024-08-29 DIAGNOSIS — K5730 Diverticulosis of large intestine without perforation or abscess without bleeding: Secondary | ICD-10-CM | POA: Diagnosis not present

## 2024-08-29 DIAGNOSIS — Z79899 Other long term (current) drug therapy: Secondary | ICD-10-CM

## 2024-08-29 DIAGNOSIS — M48061 Spinal stenosis, lumbar region without neurogenic claudication: Secondary | ICD-10-CM | POA: Diagnosis present

## 2024-08-29 DIAGNOSIS — E7800 Pure hypercholesterolemia, unspecified: Secondary | ICD-10-CM | POA: Diagnosis present

## 2024-08-29 DIAGNOSIS — E785 Hyperlipidemia, unspecified: Secondary | ICD-10-CM | POA: Insufficient documentation

## 2024-08-29 DIAGNOSIS — Z91018 Allergy to other foods: Secondary | ICD-10-CM

## 2024-08-29 DIAGNOSIS — Z87440 Personal history of urinary (tract) infections: Secondary | ICD-10-CM | POA: Diagnosis not present

## 2024-08-29 DIAGNOSIS — Z860100 Personal history of colon polyps, unspecified: Secondary | ICD-10-CM | POA: Diagnosis not present

## 2024-08-29 DIAGNOSIS — F419 Anxiety disorder, unspecified: Secondary | ICD-10-CM | POA: Diagnosis present

## 2024-08-29 DIAGNOSIS — I10 Essential (primary) hypertension: Secondary | ICD-10-CM | POA: Diagnosis present

## 2024-08-29 DIAGNOSIS — W19XXXA Unspecified fall, initial encounter: Secondary | ICD-10-CM | POA: Diagnosis not present

## 2024-08-29 DIAGNOSIS — W010XXA Fall on same level from slipping, tripping and stumbling without subsequent striking against object, initial encounter: Secondary | ICD-10-CM | POA: Diagnosis present

## 2024-08-29 DIAGNOSIS — S72009A Fracture of unspecified part of neck of unspecified femur, initial encounter for closed fracture: Secondary | ICD-10-CM | POA: Diagnosis not present

## 2024-08-29 DIAGNOSIS — G8929 Other chronic pain: Secondary | ICD-10-CM | POA: Diagnosis present

## 2024-08-29 DIAGNOSIS — E876 Hypokalemia: Secondary | ICD-10-CM | POA: Diagnosis present

## 2024-08-29 DIAGNOSIS — R52 Pain, unspecified: Secondary | ICD-10-CM

## 2024-08-29 DIAGNOSIS — R109 Unspecified abdominal pain: Secondary | ICD-10-CM | POA: Diagnosis not present

## 2024-08-29 DIAGNOSIS — S22089A Unspecified fracture of T11-T12 vertebra, initial encounter for closed fracture: Secondary | ICD-10-CM | POA: Diagnosis present

## 2024-08-29 DIAGNOSIS — M47816 Spondylosis without myelopathy or radiculopathy, lumbar region: Secondary | ICD-10-CM | POA: Diagnosis not present

## 2024-08-29 DIAGNOSIS — Z981 Arthrodesis status: Secondary | ICD-10-CM | POA: Diagnosis not present

## 2024-08-29 DIAGNOSIS — M5136 Other intervertebral disc degeneration, lumbar region with discogenic back pain only: Secondary | ICD-10-CM | POA: Diagnosis not present

## 2024-08-29 DIAGNOSIS — S22080A Wedge compression fracture of T11-T12 vertebra, initial encounter for closed fracture: Secondary | ICD-10-CM | POA: Diagnosis not present

## 2024-08-29 DIAGNOSIS — Y92009 Unspecified place in unspecified non-institutional (private) residence as the place of occurrence of the external cause: Secondary | ICD-10-CM

## 2024-08-29 DIAGNOSIS — D649 Anemia, unspecified: Secondary | ICD-10-CM | POA: Diagnosis present

## 2024-08-29 DIAGNOSIS — M4316 Spondylolisthesis, lumbar region: Secondary | ICD-10-CM | POA: Diagnosis not present

## 2024-08-29 DIAGNOSIS — R41841 Cognitive communication deficit: Secondary | ICD-10-CM | POA: Diagnosis not present

## 2024-08-29 DIAGNOSIS — M5135 Other intervertebral disc degeneration, thoracolumbar region: Secondary | ICD-10-CM | POA: Diagnosis not present

## 2024-08-29 DIAGNOSIS — E8721 Acute metabolic acidosis: Secondary | ICD-10-CM | POA: Diagnosis present

## 2024-08-29 DIAGNOSIS — S3210XD Unspecified fracture of sacrum, subsequent encounter for fracture with routine healing: Secondary | ICD-10-CM | POA: Diagnosis not present

## 2024-08-29 DIAGNOSIS — M549 Dorsalgia, unspecified: Secondary | ICD-10-CM | POA: Diagnosis not present

## 2024-08-29 DIAGNOSIS — M545 Low back pain, unspecified: Secondary | ICD-10-CM | POA: Diagnosis not present

## 2024-08-29 DIAGNOSIS — M5416 Radiculopathy, lumbar region: Secondary | ICD-10-CM | POA: Diagnosis present

## 2024-08-29 DIAGNOSIS — S22080D Wedge compression fracture of T11-T12 vertebra, subsequent encounter for fracture with routine healing: Secondary | ICD-10-CM | POA: Diagnosis not present

## 2024-08-29 DIAGNOSIS — M6281 Muscle weakness (generalized): Secondary | ICD-10-CM | POA: Diagnosis not present

## 2024-08-29 LAB — BASIC METABOLIC PANEL WITH GFR
Anion gap: 15 (ref 5–15)
BUN: 21 mg/dL (ref 8–23)
CO2: 21 mmol/L — ABNORMAL LOW (ref 22–32)
Calcium: 9 mg/dL (ref 8.9–10.3)
Chloride: 96 mmol/L — ABNORMAL LOW (ref 98–111)
Creatinine, Ser: 0.97 mg/dL (ref 0.44–1.00)
GFR, Estimated: 58 mL/min — ABNORMAL LOW (ref >60)
Glucose, Bld: 146 mg/dL — ABNORMAL HIGH (ref 70–99)
Potassium: 3.4 mmol/L — ABNORMAL LOW (ref 3.5–5.1)
Sodium: 132 mmol/L — ABNORMAL LOW (ref 135–145)

## 2024-08-29 LAB — CBC WITH DIFFERENTIAL/PLATELET
Abs Immature Granulocytes: 0.07 K/uL (ref 0.00–0.07)
Basophils Absolute: 0.1 K/uL (ref 0.0–0.1)
Basophils Relative: 1 %
Eosinophils Absolute: 0.2 K/uL (ref 0.0–0.5)
Eosinophils Relative: 3 %
HCT: 36.1 % (ref 36.0–46.0)
Hemoglobin: 11.9 g/dL — ABNORMAL LOW (ref 12.0–15.0)
Immature Granulocytes: 1 %
Lymphocytes Relative: 11 %
Lymphs Abs: 0.9 K/uL (ref 0.7–4.0)
MCH: 30.9 pg (ref 26.0–34.0)
MCHC: 33 g/dL (ref 30.0–36.0)
MCV: 93.8 fL (ref 80.0–100.0)
Monocytes Absolute: 0.4 K/uL (ref 0.1–1.0)
Monocytes Relative: 5 %
Neutro Abs: 6.4 K/uL (ref 1.7–7.7)
Neutrophils Relative %: 79 %
Platelets: 234 K/uL (ref 150–400)
RBC: 3.85 MIL/uL — ABNORMAL LOW (ref 3.87–5.11)
RDW: 13.7 % (ref 11.5–15.5)
WBC: 8 K/uL (ref 4.0–10.5)
nRBC: 0 % (ref 0.0–0.2)

## 2024-08-29 LAB — URINALYSIS, ROUTINE W REFLEX MICROSCOPIC
Bilirubin Urine: NEGATIVE
Glucose, UA: NEGATIVE mg/dL
Hgb urine dipstick: NEGATIVE
Ketones, ur: NEGATIVE mg/dL
Nitrite: NEGATIVE
Protein, ur: NEGATIVE mg/dL
Specific Gravity, Urine: 1.024 (ref 1.005–1.030)
pH: 5 (ref 5.0–8.0)

## 2024-08-29 LAB — MAGNESIUM: Magnesium: 2.3 mg/dL (ref 1.7–2.4)

## 2024-08-29 MED ORDER — PRAVASTATIN SODIUM 20 MG PO TABS
40.0000 mg | ORAL_TABLET | Freq: Every day | ORAL | Status: DC
  Administered 2024-08-29 – 2024-09-04 (×6): 40 mg via ORAL
  Filled 2024-08-29 (×6): qty 2

## 2024-08-29 MED ORDER — CARBAMAZEPINE 100 MG PO CHEW
100.0000 mg | CHEWABLE_TABLET | Freq: Every day | ORAL | Status: DC
  Administered 2024-08-29 – 2024-09-04 (×7): 100 mg via ORAL
  Filled 2024-08-29 (×8): qty 1

## 2024-08-29 MED ORDER — MORPHINE SULFATE (PF) 2 MG/ML IV SOLN
2.0000 mg | Freq: Once | INTRAVENOUS | Status: AC
  Administered 2024-08-29: 2 mg via INTRAVENOUS
  Filled 2024-08-29: qty 1

## 2024-08-29 MED ORDER — HYDROMORPHONE HCL 1 MG/ML IJ SOLN
0.5000 mg | INTRAMUSCULAR | Status: DC | PRN
  Administered 2024-08-29 – 2024-08-30 (×2): 0.5 mg via INTRAVENOUS
  Filled 2024-08-29 (×2): qty 0.5

## 2024-08-29 MED ORDER — HYDROMORPHONE HCL 1 MG/ML IJ SOLN
1.0000 mg | INTRAMUSCULAR | Status: DC | PRN
  Administered 2024-08-29: 1 mg via INTRAVENOUS
  Filled 2024-08-29: qty 1

## 2024-08-29 MED ORDER — TRAMADOL HCL 50 MG PO TABS
25.0000 mg | ORAL_TABLET | Freq: Four times a day (QID) | ORAL | Status: AC | PRN
  Administered 2024-08-30 (×2): 25 mg via ORAL
  Filled 2024-08-29 (×2): qty 1

## 2024-08-29 MED ORDER — LORAZEPAM 2 MG/ML IJ SOLN
1.0000 mg | Freq: Once | INTRAMUSCULAR | Status: AC | PRN
  Administered 2024-08-29: 1 mg via INTRAVENOUS
  Filled 2024-08-29: qty 1

## 2024-08-29 MED ORDER — METOPROLOL SUCCINATE ER 50 MG PO TB24
50.0000 mg | ORAL_TABLET | Freq: Every day | ORAL | Status: DC
  Administered 2024-08-30 – 2024-09-05 (×7): 50 mg via ORAL
  Filled 2024-08-29 (×7): qty 1

## 2024-08-29 MED ORDER — ENOXAPARIN SODIUM 40 MG/0.4ML IJ SOSY
40.0000 mg | PREFILLED_SYRINGE | INTRAMUSCULAR | Status: DC
  Administered 2024-08-29 – 2024-09-05 (×8): 40 mg via SUBCUTANEOUS
  Filled 2024-08-29 (×8): qty 0.4

## 2024-08-29 MED ORDER — NALOXONE HCL 0.4 MG/ML IJ SOLN: 0.4000 mg | INTRAMUSCULAR | Status: DC | PRN

## 2024-08-29 MED ORDER — MORPHINE SULFATE (PF) 4 MG/ML IV SOLN
4.0000 mg | Freq: Once | INTRAVENOUS | Status: AC
  Administered 2024-08-29: 4 mg via INTRAVENOUS
  Filled 2024-08-29: qty 1

## 2024-08-29 NOTE — ED Notes (Signed)
 Pt has urine sample sent to lab incase its ordered

## 2024-08-29 NOTE — ED Notes (Signed)
 Pt stated she had to urinate badly. Assisted pt with bedpan. Unable to urinate. RN notified.

## 2024-08-29 NOTE — Assessment & Plan Note (Signed)
 T12 compression fracture  Lumbar stenosis  Noted sacral fractures on imaging s/p mechanical fall roughly 4-5 days ago  + secondary nonacute t12 compression fracture and L2-L3 spinal stenosis and related bilateral radiculopathy without bowel/bladder incontinence  Per ED PA [facility], case discussed w/ On call NSG and Ortho with recommendations of nonoperative management- formal consult orders in place Pain control  PT/OT consulted  Follow up recommendations

## 2024-08-29 NOTE — ED Notes (Signed)
 Patient transported to MRI

## 2024-08-29 NOTE — Plan of Care (Signed)
  Problem: Coping: Goal: Level of anxiety will decrease Outcome: Progressing   Problem: Pain Managment: Goal: General experience of comfort will improve and/or be controlled Outcome: Progressing   Problem: Elimination: Goal: Will not experience complications related to bowel motility Outcome: Progressing Goal: Will not experience complications related to urinary retention Outcome: Progressing

## 2024-08-29 NOTE — Progress Notes (Signed)
 PT Cancellation Note  Patient Details Name: Janet Boyer MRN: [id number] DOB: 08-26-1941   Cancelled Treatment:    Reason Eval/Treat Not Completed: Patient not medically ready. Pt with acute sacral fracture. Will hold PT today to allow some time for improved pain control. Will also await WB orders from either hospitalist or specialist consult. Will check back on tomorrow.    Dannial SQUIBB, PT Acute Rehabilitation  Office: [phone]

## 2024-08-29 NOTE — ED Notes (Signed)
 Report given to 3W nurse [facility]

## 2024-08-29 NOTE — Assessment & Plan Note (Signed)
 BP stable Titrate home regimen

## 2024-08-29 NOTE — Progress Notes (Signed)
 OT Cancellation Note  Patient Details Name: Janet Boyer MRN: [id number] DOB: 03/25/1941   Cancelled Treatment:    Reason Eval/Treat Not Completed: (P) Pain limiting ability to participate, Pt c/o pain to sacrum, RN informed Pt requesting meds, declines OOB activities, will return tomorrow.   Elouise JONELLE Bott 08/29/2024, 2:54 PM

## 2024-08-29 NOTE — Assessment & Plan Note (Signed)
 Stable  Cont mevacor

## 2024-08-29 NOTE — H&P (Signed)
 History and Physical    Patient: Janet Boyer FMW:[id number] DOB: Nov 15, 1940 DOA: 08/29/2024 DOS: the patient was seen and examined on 08/29/2024 PCP: Yolande Toribio MATSU, MD  Patient coming from: Home  Chief Complaint:  Chief Complaint  Patient presents with   Fall   HPI: Janet Boyer is a 83 y.o. female with medical history significant of hypertension, hyperlipidemia, trigeminal neuralgia presenting with fall, sacral fracture, T12 compression fracture, lumbar stenosis and radiculopathy.  Patient reports having a mechanical fall at home roughly 1 week ago.  Patient states she slipped on some water at home subsequently landing on her tailbone.  Patient denies any weakness or dizziness prior to event.  Patient states she was otherwise able to ambulate over the course of the week though with fairly rapid decompensation in the past 1 to 2 days with noted secondary radiculopathy as well as weakness.  No chest pain or shortness of breath.  No nausea or vomiting.  Does not report any syncopal type symptoms prior to the initial fall 1 week ago.  Noted to have been admitted February of this year for issues including right hip fracture after mechanical fall.  Had otherwise normal postoperative and rehab course.  No recent medications or changes.  No reported illicit drug use.  No reported alcohol  or tobacco use.  Patient reports worsening radiculopathy symptoms in the bilateral feet.  No bowel or bladder incontinence. Presented to the ER afebrile, hemodynamically stable.  Satting well on room air.  White count 8, hemoglobin 11.9, platelets 234, creatinine 0.97.  Glucose 146.  Initial CT spine with T12 compression fracture with about the 67% height loss as well as lumbar stenosis at L2-L3.  Follow-up  MRI imaging showing no acute sacral fracture not otherwise noted on CT imaging.  Per ER PA, case discussed with on-call neurosurgery and Ortho.  No acute indication for surgical intervention at  present. Review of Systems: As mentioned in the history of present illness. All other systems reviewed and are negative. Past Medical History:  Diagnosis Date   Anxiety    Arthritis    Chronic back pain    Colon polyps    Depression    H/O urinary tract infection    as a young woman   High cholesterol    Hyperglycemia    Hyperlipidemia    Hypertension    Hyponatremia    Pain of left lower extremity    Trigeminal neuralgia    Weight gain    Past Surgical History:  Procedure Laterality Date   ABDOMINAL EXPLORATION SURGERY     as a 83 year old   APPENDECTOMY     COLONOSCOPY     INTRAMEDULLARY (IM) NAIL INTERTROCHANTERIC Right 12/15/2023   Procedure: RIGHT INTERTROCHANTERIC INTRAMEDULLARY (IM) NAIL;  Surgeon: Beverley Evalene BIRCH, MD;  Location: [facility];  Service: Orthopedics;  Laterality: Right;   MAXIMUM ACCESS (MAS)POSTERIOR LUMBAR INTERBODY FUSION (PLIF) 1 LEVEL N/A 12/30/2013   Procedure: FOR MAXIMUM ACCESS (MAS) POSTERIOR LUMBAR INTERBODY FUSION LUMBAR FOUR-FIVE;  Surgeon: Alm GORMAN Molt, MD;  Location: [facility];  Service: Neurosurgery;  Laterality: N/A;  FOR MAXIMUM ACCESS (MAS) POSTERIOR LUMBAR INTERBODY FUSION LUMBAR FOUR-FIVE   REVERSE SHOULDER ARTHROPLASTY Left 06/28/2020   Procedure: REVERSE SHOULDER ARTHROPLASTY;  Surgeon: Melita Drivers, MD;  Location: [facility];  Service: Orthopedics;  Laterality: Left;    TONSILLECTOMY     Social History:  reports that she has never smoked. She has never used smokeless tobacco. She reports current  alcohol  use of about 7.0 standard drinks of alcohol  per week. She reports that she does not use drugs.  Allergies  Allergen Reactions   Other     Strawberries tomatoes    Family History  Problem Relation Age of Onset   Dementia Mother    Thyroid  disease Mother    Cancer Sister     Prior to Admission medications   Medication Sig Start Date End Date Taking? Authorizing Provider  acetaminophen  (TYLENOL ) 325 MG tablet Take 1-2  tablets (325-650 mg total) by mouth every 6 (six) hours as needed for mild pain (pain score 1-3) (or temp > 100.5). 12/22/23   Lue Elsie BROCKS, MD  carbamazepine  (TEGRETOL ) 200 MG tablet Take 50-100 mg by mouth 2 (two) times daily as needed (for trigeminal neuralgia). Take 1/4 tablet in the morning and 1/2 tablet in the evening. 11/14/13   [provider]  lovastatin (MEVACOR) 40 MG tablet Take 40 mg by mouth daily. Pt takes after eats a meal in the am    [provider]  metoprolol  succinate (TOPROL -XL) 50 MG 24 hr tablet Take 50 mg by mouth daily. Take with or immediately following a meal.    [provider]    Physical Exam: Vitals:   08/29/24 1015 08/29/24 1030 08/29/24 1045 08/29/24 1353  BP:  (!) 170/66  (!) 137/56  Pulse:    (!) 59  Resp: 19 19 13 18   Temp:    98.2 F (36.8 C)  TempSrc:    Oral  SpO2:    98%   Physical Exam Constitutional:      Appearance: She is normal weight.  HENT:     Head: Normocephalic and atraumatic.     Nose: Nose normal.     Mouth/Throat:     Mouth: Mucous membranes are moist.  Eyes:     Pupils: Pupils are equal, round, and reactive to light.  Cardiovascular:     Rate and Rhythm: Normal rate and regular rhythm.  Pulmonary:     Effort: Pulmonary effort is normal.  Abdominal:     General: Bowel sounds are normal.  Musculoskeletal:        General: Normal range of motion.     Comments: + TTP across lumbar spine  Neurovascularly intact distally    Skin:    General: Skin is warm.  Neurological:     General: No focal deficit present.  Psychiatric:        Mood and Affect: Mood normal.     Data Reviewed:  There are no new results to review at this time.  MR PELVIS WO CONTRAST CLINICAL DATA:  Low back and pelvic pain since falling last week.  EXAM: MRI PELVIS WITHOUT CONTRAST  TECHNIQUE: Multiplanar multisequence MR imaging of the pelvis was performed. No intravenous contrast was  administered.  COMPARISON:  CT lumbar spine and pelvis 08/29/2024. Hip radiographs 12/14/2023.  FINDINGS: Bones/Joint/Cartilage  There are acute/subacute, mildly displaced fractures of both sacral ala, worse on the right. Associated bone marrow and surrounding soft tissue edema, asymmetric to the right. There are nondisplaced left parasymphyseal fractures with associated marrow edema. No other pelvic fractures are identified, and the sacroiliac joints and symphysis pubis are intact. There are postsurgical changes from proximal right femoral ORIF without evidence of acute osseous abnormality. The previously demonstrated right intertrochanteric femur fracture appears healed. However, there is serpiginous low T1 signal within the right femoral head, suspicious for underlying osteonecrosis. Mild associated right hip degenerative changes without significant  joint effusion. No evidence of left femoral head osteonecrosis or significant joint effusion. Lumbar spine findings dictated separately.  Ligaments  No significant ligamentous abnormalities are identified.  Muscles and Tendons Atrophy of the erector spinae musculature inferiorly and the right hip musculature. Asymmetric edema tracks along the right iliac bone and iliacus muscle, attributed to the sacral fractures. No acute tendon abnormalities are identified.  Soft tissue As above, presacral edema asymmetric to the right, attributed to the sacral fractures. No evidence of hip periarticular hematoma. Mild distal colonic diverticulosis.  IMPRESSION: 1. Acute/subacute, mildly displaced fractures of both sacral ala, worse on the right. 2. Nondisplaced left parasymphyseal fractures. 3. Postsurgical changes from proximal right femoral ORIF. The previously demonstrated right intertrochanteric femur fracture appears healed. Findings suspicious for underlying right femoral head osteonecrosis. 4. Lumbar spine findings dictated  separately.  Electronically Signed   By: Elsie Perone M.D.   On: 08/29/2024 12:29 MR LUMBAR SPINE WO CONTRAST CLINICAL DATA:  Low back pain since falling last week. Unable to stand without assistance today and increased pain.  EXAM: MRI LUMBAR SPINE WITHOUT CONTRAST  TECHNIQUE: Multiplanar, multisequence MR imaging of the lumbar spine was performed. No intravenous contrast was administered.  COMPARISON:  CT lumbar spine 08/29/2024.  Lumbar MRI 11/21/2011.  FINDINGS: Segmentation: Conventional anatomy assumed, with the last open disc space designated L5-S1.Concordant with prior imaging.  Alignment: Minimal convex left scoliosis. Fused degenerative grade 1 anterolisthesis at L4-5.  Vertebrae: No evidence of acute lumbar spine fracture or traumatic subluxation. As seen on the recent lumbar spine CT, there is a nonacute severe compression fracture at T12 which is healed, without residual marrow edema. This fracture is associated with 65% loss of vertebral body height and 4 mm of osseous retropulsion. There is an acute sacral fracture, further described on separate examination of the pelvis. There are postsurgical changes from L4-5 PLIF.  Conus medullaris: Extends to the L1-2 level. The conus and cauda equina appear normal.  Paraspinal and other soft tissues: No significant lumbar paraspinal findings. There is presacral edema attributed to the sacral fractures. Bilateral renal cysts for which no specific follow-up imaging is recommended.  Disc levels:  T11-12: Mild disc bulging with osseous retropulsion. No cord deformity or significant foraminal narrowing.  T12-L1: Mild disc bulging. No spinal stenosis or significant foraminal narrowing.  L1-2: No significant findings.  L2-3: Mild disc bulging with mild facet and ligamentous hypertrophy. Resulting mild spinal stenosis with mild lateral recess narrowing bilaterally. No nerve root impingement.  L3-4: Preserved disc  height with mild disc bulging, facet and ligamentous hypertrophy. No significant spinal stenosis or foraminal narrowing.  L4-5: Status post posterior decompression and PLIF. The spinal canal and foramina are widely patent.  L5-S1: Preserved disc height. Mild bilateral facet hypertrophy. No significant spinal stenosis or foraminal narrowing.  IMPRESSION: 1. Acute sacral fracture, further described on separate examination of the pelvis. 2. No acute lumbar spine fracture or traumatic subluxation. 3. Nonacute severe T12 compression fracture. 4. Mild multifactorial spinal stenosis at L2-3. 5. Postsurgical changes at L4-5 without residual spinal stenosis or nerve root impingement.  Electronically Signed   By: Elsie Perone M.D.   On: 08/29/2024 12:17 CT PELVIS WO CONTRAST EXAM: CT Pelvis, Without IV Contrast 08/29/2024 08:18:26 AM  TECHNIQUE: Axial images were acquired through the pelvis without IV contrast. Reformatted images were reviewed. Automated exposure control, iterative reconstruction, and/or weight based adjustment of the mA/kV was utilized to reduce the radiation dose to as low as reasonably achievable.  COMPARISON:  Right hip series dated 12/14/2023.  CLINICAL HISTORY: Hip trauma, fracture suspected, no prior imaging. Patient arrives EMS from home with reports of fall last week. Patient has had lower back pain/tail bone pain since the fall. Patient has been ambulatory with walker but this morning was unable to stand without assistance and has had increased pain.  FINDINGS:  BONES: Since the previous study, the patient has undergone open reduction and internal fixation of the right femoral head and neck. An intramedullary screw is present within the femoral head and neck and an interlocking rod within the proximal femoral shaft. There is no apparent acute fracture. The bony pelvis is intact.  JOINTS: No dislocation. The joint spaces are normal.  SOFT  TISSUES: The soft tissues are unremarkable.  INTRAPELVIC CONTENTS: Limited images of the intrapelvic contents demonstrate numerous sigmoid diverticula.  IMPRESSION: 1. No acute osseous abnormality. 2. Status post open reduction and internal fixation of the right femoral head and neck with intramedullary screw and interlocking rod within the proximal femoral shaft.  Electronically signed by: Evalene Coho MD 08/29/2024 08:36 AM EDT RP Workstation: [id number] CT Lumbar Spine Wo Contrast EXAM: CT OF THE LUMBAR SPINE WITHOUT CONTRAST 08/29/2024 08:18:26 AM  TECHNIQUE: CT of the lumbar spine was performed without the administration of intravenous contrast. Multiplanar reformatted images are provided for review. Automated exposure control, iterative reconstruction, and/or weight based adjustment of the mA/kV was utilized to reduce the radiation dose to as low as reasonably achievable.  COMPARISON: CT of the lumbar spine dated 11/24/2013.  CLINICAL HISTORY: Back trauma, no prior imaging (Age >= 16y); Lumbar radiculopathy, trauma. Pt arrives EMS from home with reports of fall last week. Pt has had lower back pain/ tail bone pain since the fall. Pt has been ambulatory with walker but this morning was unable to stand without assistance and has had increased pain.  FINDINGS:  BONES AND ALIGNMENT: Mild levoscoliotic curvature of the thoracolumbar spine again demonstrated. Since the previous study, the patient has developed a moderate chronic compression deformity of T12. The vertebral body has lost approximately 60 to 70% of its height anteriorly and there is buckling of the posterior superior corner of the vertebral body which is causing moderate central spinal canal stenosis. The patient has also undergone decompression laminectomies, interbody fusion and bilateral posterolateral spinal fixation at L4-L5. There is persistent grade 1 anterolisthesis at this level. Normal  vertebral body heights for L1, L2, L3, L5, and S1. No acute fracture or suspicious bone lesion.  DEGENERATIVE CHANGES: At L2-L3, there is mild diffuse disc bulging with mild central spinal canal stenosis and bilateral lateral recess stenosis. There are mild facet hypertrophic changes present at L3-L4. There is mild central spinal canal stenosis. At L5-S1, there is moderate bilateral facet arthrosis. The spinal canal and neural foramina appear widely patent.  SOFT TISSUES: Aorta. There is a cyst with a partially calcified rim arising from the left kidney. There is also a simple-appearing cyst arising laterally from the lower pole of the right kidney. No further follow up of the cyst is necessary. No acute abnormality.  IMPRESSION: 1. Moderate chronic compression deformity of T12 with approximately 60-70% anterior height loss and buckling of the posterior superior corner, causing moderate central spinal canal stenosis. 2. Status post decompression laminectomies, interbody fusion, and bilateral posterolateral spinal fixation at L4-5 with persistent grade 1 anterolisthesis. 3. Mild central spinal canal stenosis and bilateral lateral recess stenosis at L2-3. 4. Mild central spinal canal stenosis at L3-4.  Electronically signed by:  Evalene Coho MD 08/29/2024 08:32 AM EDT RP Workstation: [id number]  Lab Results  Component Value Date   WBC 8.0 08/29/2024   HGB 11.9 (L) 08/29/2024   HCT 36.1 08/29/2024   MCV 93.8 08/29/2024   PLT 234 08/29/2024   Last metabolic panel Lab Results  Component Value Date   GLUCOSE 146 (H) 08/29/2024   NA 132 (L) 08/29/2024   K 3.4 (L) 08/29/2024   CL 96 (L) 08/29/2024   CO2 21 (L) 08/29/2024   BUN 21 08/29/2024   CREATININE 0.97 08/29/2024   GFRNONAA 58 (L) 08/29/2024   CALCIUM 9.0 08/29/2024   ANIONGAP 15 08/29/2024    Assessment and Plan: Sacral fracture (HCC) T12 compression fracture  Lumbar stenosis  Noted sacral fractures on  imaging s/p mechanical fall roughly 4-5 days ago  + secondary nonacute t12 compression fracture and L2-L3 spinal stenosis and related bilateral radiculopathy without bowel/bladder incontinence  Per ED PA [facility], case discussed w/ On call NSG and Ortho with recommendations of nonoperative management- formal consult orders in place Pain control  PT/OT consulted  Follow up recommendations     HLD (hyperlipidemia) Stable  Cont mevacor    Trigeminal neuralgia Stable  Cont tegretol     Hypertension BP stable  Titrate home regimen        Advance Care Planning:   Code Status: Full Code   Consults: NSG and Ortho   Family Communication: Husband at the bedside   Severity of Illness: The appropriate patient status for this patient is OBSERVATION. Observation status is judged to be reasonable and necessary in order to provide the required intensity of service to ensure the patient's safety. The patient's presenting symptoms, physical exam findings, and initial radiographic and laboratory data in the context of their medical condition is felt to place them at decreased risk for further clinical deterioration. Furthermore, it is anticipated that the patient will be medically stable for discharge from the hospital within 2 midnights of admission.   Author: Elspeth JINNY Masters, MD 08/29/2024 3:04 PM  For on call review [email or web address].

## 2024-08-29 NOTE — ED Triage Notes (Addendum)
 Pt arrives EMS from home with reports of fall last week. Pt has had lower back pain/ tail bone pain since the fall. Pt has been ambulatory with walker but this morning was unable to stand without assistance and has had increased pain. Pt alert and oriented x4

## 2024-08-29 NOTE — ED Provider Notes (Signed)
  EMERGENCY DEPARTMENT AT [facility] Provider Note   CSN: [id number] Arrival date & time: 08/29/24  9358     Patient presents with: Janet Boyer is a 83 y.o. female.  Fall  Patient is an 83 year old female presenting to the ED today for concerns for low back pain that has been progressive x 1 week after slipping from couch and falling on her buttocks to the floor.  Noted to have been immediately able to be ambulatory, and had been ambulatory with cane until this morning, where she has endorsed increased tailbone and low back pain.  Patient with history of lumbar spinal fusion/discectomy, trigeminal neuralgia, chronic low back pain, chronic lower extremity pain, HTN, HLD.  Last seen by orthopedics on 03/2024 for intertrochanteric fracture of right femur.  Notes that she has been feeling increasingly generally weak secondary to pain.  With pain radiating to legs bilaterally.  Denies numbness, tingling, saddle paresthesias, fecal/urinary incontinence, fever, unexplained weight loss, dysuria, IVD use.  Also denying blurry vision, vertigo, tinnitus, dysphagia, unilateral weakness, chest pain, shortness of breath, abdominal pain, nausea, vomiting, diarrhea, melena, hematochezia, lower leg swelling.       Prior to Admission medications   Medication Sig Start Date End Date Taking? Authorizing Provider  acetaminophen  (TYLENOL ) 325 MG tablet Take 1-2 tablets (325-650 mg total) by mouth every 6 (six) hours as needed for mild pain (pain score 1-3) (or temp > 100.5). 12/22/23   Janet Elsie BROCKS, MD  carbamazepine  (TEGRETOL ) 200 MG tablet Take 50-100 mg by mouth 2 (two) times daily as needed (for trigeminal neuralgia). Take 1/4 tablet in the morning and 1/2 tablet in the evening. 11/14/13   [provider]  lovastatin (MEVACOR) 40 MG tablet Take 40 mg by mouth daily. Pt takes after eats a meal in the am    [provider]  metoprolol   succinate (TOPROL -XL) 50 MG 24 hr tablet Take 50 mg by mouth daily. Take with or immediately following a meal.    [provider]    Allergies: Other    Review of Systems  Musculoskeletal:  Positive for back pain and gait problem.  All other systems reviewed and are negative.   Updated Vital Signs BP (!) 137/56 (BP Location: Left Arm)   Pulse (!) 59   Temp 98.2 F (36.8 C) (Oral)   Resp 18   SpO2 98%   Physical Exam Vitals and nursing note reviewed.  Constitutional:      General: She is not in acute distress.    Appearance: Normal appearance. She is not ill-appearing or diaphoretic.  HENT:     Head: Normocephalic and atraumatic.  Eyes:     General: No scleral icterus.       Right eye: No discharge.        Left eye: No discharge.     Extraocular Movements: Extraocular movements intact.     Conjunctiva/sclera: Conjunctivae normal.     Pupils: Pupils are equal, round, and reactive to light.  Cardiovascular:     Rate and Rhythm: Normal rate and regular rhythm.     Pulses: Normal pulses.     Heart sounds: Normal heart sounds. No murmur heard.    No friction rub. No gallop.  Pulmonary:     Effort: Pulmonary effort is normal. No respiratory distress.     Breath sounds: No stridor. No wheezing, rhonchi or rales.  Chest:     Chest wall: No tenderness.  Abdominal:  General: Abdomen is flat. There is no distension.     Palpations: Abdomen is soft.     Tenderness: There is no abdominal tenderness. There is no right CVA tenderness, left CVA tenderness, guarding or rebound.  Musculoskeletal:        General: Tenderness (Noted to have midline tenderness over lumbar spine, coccyx.) present. No swelling, deformity or signs of injury.     Cervical back: Normal range of motion. No rigidity or tenderness.     Right lower leg: No edema.     Left lower leg: No edema.     Comments: No signs of ecchymosis, erythema.  With surgical scar noted on lumbar spine.  Skin:    General:  Skin is warm and dry.     Findings: No bruising, erythema or lesion.     Comments: DP pulse 2+ bilaterally.  Neurological:     General: No focal deficit present.     Mental Status: She is alert and oriented to person, place, and time. Mental status is at baseline.     Sensory: No sensory deficit.     Motor: No weakness.     Comments: No facial asymmetry, no ataxia, no apraxia, no aphasia, no arm drift, normal coordination with finger-to-nose, normal sensation to both upper and lower extremities bilaterally, normal grip strength bilaterally, normal strength to both flexion and extension to both upper lower extremities 5+ bilaterally, no visual field deficits, no nystagmus.   Psychiatric:        Mood and Affect: Mood normal.     (all labs ordered are listed, but only abnormal results are displayed) Labs Reviewed  CBC WITH DIFFERENTIAL/PLATELET - Abnormal; Notable for the following components:      Result Value   RBC 3.85 (*)    Hemoglobin 11.9 (*)    All other components within normal limits  BASIC METABOLIC PANEL WITH GFR - Abnormal; Notable for the following components:   Sodium 132 (*)    Potassium 3.4 (*)    Chloride 96 (*)    CO2 21 (*)    Glucose, Bld 146 (*)    GFR, Estimated 58 (*)    All other components within normal limits  URINALYSIS, ROUTINE W REFLEX MICROSCOPIC - Abnormal; Notable for the following components:   APPearance HAZY (*)    Leukocytes,Ua TRACE (*)    Bacteria, UA RARE (*)    All other components within normal limits    EKG: None  Radiology: MR PELVIS WO CONTRAST Result Date: 08/29/2024 CLINICAL DATA:  Low back and pelvic pain since falling last week. EXAM: MRI PELVIS WITHOUT CONTRAST TECHNIQUE: Multiplanar multisequence MR imaging of the pelvis was performed. No intravenous contrast was administered. COMPARISON:  CT lumbar spine and pelvis 08/29/2024. Hip radiographs 12/14/2023. FINDINGS: Bones/Joint/Cartilage There are acute/subacute, mildly  displaced fractures of both sacral ala, worse on the right. Associated bone marrow and surrounding soft tissue edema, asymmetric to the right. There are nondisplaced left parasymphyseal fractures with associated marrow edema. No other pelvic fractures are identified, and the sacroiliac joints and symphysis pubis are intact. There are postsurgical changes from proximal right femoral ORIF without evidence of acute osseous abnormality. The previously demonstrated right intertrochanteric femur fracture appears healed. However, there is serpiginous low T1 signal within the right femoral head, suspicious for underlying osteonecrosis. Mild associated right hip degenerative changes without significant joint effusion. No evidence of left femoral head osteonecrosis or significant joint effusion. Lumbar spine findings dictated separately. Ligaments No significant ligamentous abnormalities are  identified. Muscles and Tendons Atrophy of the erector spinae musculature inferiorly and the right hip musculature. Asymmetric edema tracks along the right iliac bone and iliacus muscle, attributed to the sacral fractures. No acute tendon abnormalities are identified. Soft tissue As above, presacral edema asymmetric to the right, attributed to the sacral fractures. No evidence of hip periarticular hematoma. Mild distal colonic diverticulosis. IMPRESSION: 1. Acute/subacute, mildly displaced fractures of both sacral ala, worse on the right. 2. Nondisplaced left parasymphyseal fractures. 3. Postsurgical changes from proximal right femoral ORIF. The previously demonstrated right intertrochanteric femur fracture appears healed. Findings suspicious for underlying right femoral head osteonecrosis. 4. Lumbar spine findings dictated separately. Electronically Signed   By: Elsie Perone M.D.   On: 08/29/2024 12:29   MR LUMBAR SPINE WO CONTRAST Result Date: 08/29/2024 CLINICAL DATA:  Low back pain since falling last week. Unable to stand  without assistance today and increased pain. EXAM: MRI LUMBAR SPINE WITHOUT CONTRAST TECHNIQUE: Multiplanar, multisequence MR imaging of the lumbar spine was performed. No intravenous contrast was administered. COMPARISON:  CT lumbar spine 08/29/2024.  Lumbar MRI 11/21/2011. FINDINGS: Segmentation: Conventional anatomy assumed, with the last open disc space designated L5-S1.Concordant with prior imaging. Alignment: Minimal convex left scoliosis. Fused degenerative grade 1 anterolisthesis at L4-5. Vertebrae: No evidence of acute lumbar spine fracture or traumatic subluxation. As seen on the recent lumbar spine CT, there is a nonacute severe compression fracture at T12 which is healed, without residual marrow edema. This fracture is associated with 65% loss of vertebral body height and 4 mm of osseous retropulsion. There is an acute sacral fracture, further described on separate examination of the pelvis. There are postsurgical changes from L4-5 PLIF. Conus medullaris: Extends to the L1-2 level. The conus and cauda equina appear normal. Paraspinal and other soft tissues: No significant lumbar paraspinal findings. There is presacral edema attributed to the sacral fractures. Bilateral renal cysts for which no specific follow-up imaging is recommended. Disc levels: T11-12: Mild disc bulging with osseous retropulsion. No cord deformity or significant foraminal narrowing. T12-L1: Mild disc bulging. No spinal stenosis or significant foraminal narrowing. L1-2: No significant findings. L2-3: Mild disc bulging with mild facet and ligamentous hypertrophy. Resulting mild spinal stenosis with mild lateral recess narrowing bilaterally. No nerve root impingement. L3-4: Preserved disc height with mild disc bulging, facet and ligamentous hypertrophy. No significant spinal stenosis or foraminal narrowing. L4-5: Status post posterior decompression and PLIF. The spinal canal and foramina are widely patent. L5-S1: Preserved disc height.  Mild bilateral facet hypertrophy. No significant spinal stenosis or foraminal narrowing. IMPRESSION: 1. Acute sacral fracture, further described on separate examination of the pelvis. 2. No acute lumbar spine fracture or traumatic subluxation. 3. Nonacute severe T12 compression fracture. 4. Mild multifactorial spinal stenosis at L2-3. 5. Postsurgical changes at L4-5 without residual spinal stenosis or nerve root impingement. Electronically Signed   By: Elsie Perone M.D.   On: 08/29/2024 12:17   CT PELVIS WO CONTRAST Result Date: 08/29/2024 EXAM: CT Pelvis, Without IV Contrast 08/29/2024 08:18:26 AM TECHNIQUE: Axial images were acquired through the pelvis without IV contrast. Reformatted images were reviewed. Automated exposure control, iterative reconstruction, and/or weight based adjustment of the mA/kV was utilized to reduce the radiation dose to as low as reasonably achievable. COMPARISON: Right hip series dated 12/14/2023. CLINICAL HISTORY: Hip trauma, fracture suspected, no prior imaging. Patient arrives EMS from home with reports of fall last week. Patient has had lower back pain/tail bone pain since the fall. Patient has been ambulatory  with walker but this morning was unable to stand without assistance and has had increased pain. FINDINGS: BONES: Since the previous study, the patient has undergone open reduction and internal fixation of the right femoral head and neck. An intramedullary screw is present within the femoral head and neck and an interlocking rod within the proximal femoral shaft. There is no apparent acute fracture. The bony pelvis is intact. JOINTS: No dislocation. The joint spaces are normal. SOFT TISSUES: The soft tissues are unremarkable. INTRAPELVIC CONTENTS: Limited images of the intrapelvic contents demonstrate numerous sigmoid diverticula. IMPRESSION: 1. No acute osseous abnormality. 2. Status post open reduction and internal fixation of the right femoral head and neck with  intramedullary screw and interlocking rod within the proximal femoral shaft. Electronically signed by: Evalene Coho MD 08/29/2024 08:36 AM EDT RP Workstation: [id number]   CT Lumbar Spine Wo Contrast Result Date: 08/29/2024 EXAM: CT OF THE LUMBAR SPINE WITHOUT CONTRAST 08/29/2024 08:18:26 AM TECHNIQUE: CT of the lumbar spine was performed without the administration of intravenous contrast. Multiplanar reformatted images are provided for review. Automated exposure control, iterative reconstruction, and/or weight based adjustment of the mA/kV was utilized to reduce the radiation dose to as low as reasonably achievable. COMPARISON: CT of the lumbar spine dated 11/24/2013. CLINICAL HISTORY: Back trauma, no prior imaging (Age >= 16y); Lumbar radiculopathy, trauma. Pt arrives EMS from home with reports of fall last week. Pt has had lower back pain/ tail bone pain since the fall. Pt has been ambulatory with walker but this morning was unable to stand without assistance and has had increased pain. FINDINGS: BONES AND ALIGNMENT: Mild levoscoliotic curvature of the thoracolumbar spine again demonstrated. Since the previous study, the patient has developed a moderate chronic compression deformity of T12. The vertebral body has lost approximately 60 to 70% of its height anteriorly and there is buckling of the posterior superior corner of the vertebral body which is causing moderate central spinal canal stenosis. The patient has also undergone decompression laminectomies, interbody fusion and bilateral posterolateral spinal fixation at L4-L5. There is persistent grade 1 anterolisthesis at this level. Normal vertebral body heights for L1, L2, L3, L5, and S1. No acute fracture or suspicious bone lesion. DEGENERATIVE CHANGES: At L2-L3, there is mild diffuse disc bulging with mild central spinal canal stenosis and bilateral lateral recess stenosis. There are mild facet hypertrophic changes present at L3-L4. There is mild  central spinal canal stenosis. At L5-S1, there is moderate bilateral facet arthrosis. The spinal canal and neural foramina appear widely patent. SOFT TISSUES: Aorta. There is a cyst with a partially calcified rim arising from the left kidney. There is also a simple-appearing cyst arising laterally from the lower pole of the right kidney. No further follow up of the cyst is necessary. No acute abnormality. IMPRESSION: 1. Moderate chronic compression deformity of T12 with approximately 60-70% anterior height loss and buckling of the posterior superior corner, causing moderate central spinal canal stenosis. 2. Status post decompression laminectomies, interbody fusion, and bilateral posterolateral spinal fixation at L4-5 with persistent grade 1 anterolisthesis. 3. Mild central spinal canal stenosis and bilateral lateral recess stenosis at L2-3. 4. Mild central spinal canal stenosis at L3-4. Electronically signed by: Evalene Coho MD 08/29/2024 08:32 AM EDT RP Workstation: [id number]    Procedures   Medications Ordered in the ED  morphine  (PF) 4 MG/ML injection 4 mg (4 mg Intravenous Given 08/29/24 0741)  LORazepam  (ATIVAN ) injection 1 mg (1 mg Intravenous Given 08/29/24 1034)  morphine  (PF) 2 MG/ML  injection 2 mg (2 mg Intravenous Given 08/29/24 0933)  morphine  (PF) 2 MG/ML injection 2 mg (2 mg Intravenous Given 08/29/24 1409)    Clinical Course as of 08/29/24 1428  Mon Aug 29, 2024  0934 Spoke with neurosurgery, Janet Pean NP, who looked at the CT and spoke with attending who said that radiology missed obvious sacral fracture and would recommend MRI pelvis and lumbar, could possibly admit for pain management and PT.  Ortho would need to be called if sacral fracture need to be reevaluated. [CB]  1318 Spoke with Dr. Elsa with orthopedic, who recommends that the patient undergo PT OT as discussed as well as weightbearing as tolerated. [CB]    Clinical Course User Index [CB] Beola Terrall RAMAN, PA-C    Medical Decision Making Amount and/or Complexity of Data Reviewed Labs: ordered. Radiology: ordered.  Risk Prescription drug management. Decision regarding hospitalization.   This patient is a 83 year old female who presents to the ED for concern of progressive low back pain starting to become acutely worse this a.m. upon waking after a fall 1 week ago where she slipped from couch and landed on tailbone where she was immediately ambulatory, did not hit head, did not lose consciousness and not on blood thinners.  Notes that she has had difficulty walking this morning after using the bathroom, secondary to pain to tailbone and low back.  On physical exam, patient is in no acute distress, afebrile, alert and orient x 4, speaking in full sentences, nontachypneic, nontachycardic.  Normal neuroexam.  LCTAB, RRR, no murmur.  Notably does have midline tenderness to lumbar spine, surgical scar present with no ecchymosis/erythema.  No CVA tenderness.  Unremarkable exam otherwise.  Low suspicion for cauda equina syndrome, CVA at this time with no saddle paresthesias, fecal/urinary incontinence, and normal neuroexam.  Will CT lumbar spine for concerns for possible fracture versus muscle strain at this time.  Will also obtain baseline labs with patient noting general weakness.  Labs near baseline.  CT scan initially did not show any acute injury.  However spoke to neurosurgery with patient having some intermittent left sided foot neurodeficits of numbness and tingling which she says she gets intermittently at baseline but is present today with current injury.  Neurosurgery said that radiology missed sacral fracture.  Requesting MRIs.   MRIs were done showing sacral fracture, spoke to Dr. Elsa with orthopedic surgery who recommended PT OT and pain management in the hospital, able to bear weight as tolerated.  Patient was admitted to [facility].  Differential diagnoses prior to evaluation: The  emergent differential diagnosis includes, but is not limited to, fracture, ligamentous injury, neurovascular injury, dislocation, malalignment, cauda equina. This is not an exhaustive differential.   Past Medical History / Co-morbidities / Social History: HTN, chronic back pain, HLD, trigeminal neuralgia, hyponatremia, hyperglycemia, chronic extremity pain, trigeminal neuralgia, status post lumbar fusion   Additional history: Chart reviewed. Pertinent results include:   Last seen by orthopedic surgery on 03/29/2024 for displaced intertrochanteric fracture of right femur for subsequent visit with routine healing.  Spinal surgery done by Janet Boyer in 2015, lumbar fusion and decompression lumbar laminectomy of L4-L5.  With posterior fixation of L4-L5.  Lab Tests/Imaging studies: I personally interpreted labs/imaging and the pertinent results include:   CBC notes improved hemoglobin BMP notes a mildly decreased GFR 58 with normal creatinine and near baseline labs.  UA unremarkable  CT pelvis shows no acute osseous abnormalities to pelvis CT lumbar showed chronic compression deformity  of T12 with moderate central spinal stenosis as well as mild central canal stenosis and bilateral recess stenosis at L2-L3 as well as L3-L4. MRI of pelvis and lumbar shows acute sacral fracture as well as a nonacute severe T12 compression fracture and sacral nondisplaced left parasymphyseal fractures.  I agree with the radiologist interpretation.   Medications: I ordered medication including morphine , Ativan .  I have reviewed the patients home medicines and have made adjustments as needed.  Critical Interventions: None  Social Determinants of Health: Lives at home with husband   Disposition: After consideration of the diagnostic results and the patients response to treatment, I feel that the patient would benefit from admission, with patient care admitted to Janet Boyer  Final diagnoses:  Closed  fracture of sacrum, unspecified portion of sacrum, initial encounter [facility])  Pain management    ED Discharge Orders     None          Beola Terrall RAMAN, [place] 08/29/24 1428    Tegeler, Lonni PARAS, MD 08/29/24 1555

## 2024-08-29 NOTE — ED Notes (Signed)
Pt son updated.

## 2024-08-29 NOTE — Assessment & Plan Note (Signed)
 Stable  Cont tegretol 

## 2024-08-30 DIAGNOSIS — S72009A Fracture of unspecified part of neck of unspecified femur, initial encounter for closed fracture: Secondary | ICD-10-CM | POA: Diagnosis not present

## 2024-08-30 LAB — COMPREHENSIVE METABOLIC PANEL WITH GFR
ALT: 10 U/L (ref 0–44)
AST: 20 U/L (ref 15–41)
Albumin: 3.7 g/dL (ref 3.5–5.0)
Alkaline Phosphatase: 85 U/L (ref 38–126)
Anion gap: 11 (ref 5–15)
BUN: 20 mg/dL (ref 8–23)
CO2: 23 mmol/L (ref 22–32)
Calcium: 8.7 mg/dL — ABNORMAL LOW (ref 8.9–10.3)
Chloride: 100 mmol/L (ref 98–111)
Creatinine, Ser: 0.73 mg/dL (ref 0.44–1.00)
GFR, Estimated: >60 mL/min (ref >60)
Glucose, Bld: 104 mg/dL — ABNORMAL HIGH (ref 70–99)
Potassium: 3.5 mmol/L (ref 3.5–5.1)
Sodium: 134 mmol/L — ABNORMAL LOW (ref 135–145)
Total Bilirubin: 0.5 mg/dL (ref 0.0–1.2)
Total Protein: 6.5 g/dL (ref 6.5–8.1)

## 2024-08-30 LAB — CBC
HCT: 32.8 % — ABNORMAL LOW (ref 36.0–46.0)
Hemoglobin: 10.8 g/dL — ABNORMAL LOW (ref 12.0–15.0)
MCH: 31.8 pg (ref 26.0–34.0)
MCHC: 32.9 g/dL (ref 30.0–36.0)
MCV: 96.5 fL (ref 80.0–100.0)
Platelets: 197 K/uL (ref 150–400)
RBC: 3.4 MIL/uL — ABNORMAL LOW (ref 3.87–5.11)
RDW: 13.8 % (ref 11.5–15.5)
WBC: 6.7 K/uL (ref 4.0–10.5)
nRBC: 0 % (ref 0.0–0.2)

## 2024-08-30 MED ORDER — HYDROMORPHONE HCL 1 MG/ML IJ SOLN: 1.0000 mg | INTRAMUSCULAR | Status: DC | PRN

## 2024-08-30 MED ORDER — OXYCODONE HCL 5 MG PO TABS
5.0000 mg | ORAL_TABLET | ORAL | Status: DC | PRN
  Administered 2024-08-30 – 2024-08-31 (×4): 10 mg via ORAL
  Filled 2024-08-30 (×4): qty 2

## 2024-08-30 MED ORDER — HYDROMORPHONE HCL 1 MG/ML IJ SOLN
1.0000 mg | Freq: Once | INTRAMUSCULAR | Status: AC
  Administered 2024-08-30: 1 mg via INTRAVENOUS
  Filled 2024-08-30: qty 1

## 2024-08-30 MED ORDER — POLYETHYLENE GLYCOL 3350 17 G PO PACK: 17.0000 g | PACK | Freq: Every day | ORAL | Status: DC | PRN

## 2024-08-30 MED ORDER — TRAMADOL HCL 50 MG PO TABS
25.0000 mg | ORAL_TABLET | Freq: Four times a day (QID) | ORAL | Status: DC | PRN
Start: 2024-08-30 — End: 2024-08-30

## 2024-08-30 MED ORDER — HYDROMORPHONE HCL 1 MG/ML IJ SOLN
1.0000 mg | INTRAMUSCULAR | Status: DC | PRN
  Administered 2024-08-31 – 2024-09-01 (×3): 1 mg via INTRAVENOUS
  Filled 2024-08-30 (×3): qty 1

## 2024-08-30 MED ORDER — BISACODYL 10 MG RE SUPP: 10.0000 mg | Freq: Every day | RECTAL | Status: DC | PRN

## 2024-08-30 MED ORDER — SENNOSIDES-DOCUSATE SODIUM 8.6-50 MG PO TABS
1.0000 | ORAL_TABLET | Freq: Two times a day (BID) | ORAL | Status: DC
  Administered 2024-08-30 – 2024-08-31 (×4): 1 via ORAL
  Filled 2024-08-30 (×4): qty 1

## 2024-08-30 NOTE — Evaluation (Addendum)
 Physical Therapy Evaluation Patient Details Name: Janet Boyer MRN: [id number] DOB: 02/19/1941 Today's Date: 08/30/2024  History of Present Illness  83 y.o. female presented with a mechanical fall at home roughly a week ago prior to presentation. on admission pt with worsening lower back pain; imaging revealed T12 compression fracture with about 60 to 70% height loss with mild L2 and L3 spinal stenosis and acute sacral fracture. ED provider spoke to on-call neurosurgery and orthopedics who recommended conservative management and no surgical intervention.   PMH: hypertension, hyperlipidemia, trigeminal neuralgia, depression, anxiety, IM nail 12/15/23, hyponatremia.  Clinical Impression  Pt admitted with above diagnosis.  Pt was independent, household ambulator, ind with ADLs, sponge bathes; spouse present for PT eval today, very supportive.  Pt +2 mod assist for bed mobility and transfers. Limited by pain.  Patient will benefit from continued inpatient follow up therapy, <3 hours/day.   Pt currently with functional limitations due to the deficits listed below (see PT Problem List). Pt will benefit from acute skilled PT to increase their independence and safety with mobility to allow discharge.           If plan is discharge home, recommend the following: Two people to help with walking and/or transfers;Two people to help with bathing/dressing/bathroom   Can travel by private vehicle   No    Equipment Recommendations None recommended by PT  Recommendations for Other Services       Functional Status Assessment Patient has had a recent decline in their functional status and demonstrates the ability to make significant improvements in function in a reasonable and predictable amount of time.     Precautions / Restrictions Precautions Precautions: Fall Restrictions Weight Bearing Restrictions Per Provider Order: No Other Position/Activity Restrictions: WBAT bil LEs       Mobility  Bed Mobility Overal bed mobility: Needs Assistance Bed Mobility: Supine to Sit     Supine to sit: Mod assist, +2 for physical assistance, +2 for safety/equipment     General bed mobility comments: multi-modal cues for sequence, attempted log roll, unable d/t pain; HOB elevated. +2 mod assist to progress LEs off bed and elevate trunk    Transfers Overall transfer level: Needs assistance Equipment used: Rolling walker (2 wheels) Transfers: Sit to/from Stand, Bed to chair/wheelchair/BSC Sit to Stand: Mod assist, +2 physical assistance, +2 safety/equipment   Step pivot transfers: Max assist, +2 physical assistance, +2 safety/equipment       General transfer comment: multi-modal cues for sequence, hand placement, LE position. assist to rise and transition to RW, assist to balance and manage RW for stand step pivot to chair.    Ambulation/Gait               General Gait Details: unable d/t pain  Stairs            Wheelchair Mobility     Tilt Bed    Modified Rankin (Stroke Patients Only)       Balance Overall balance assessment: Needs assistance, History of Falls Sitting-balance support: Feet supported, Bilateral upper extremity supported Sitting balance-Leahy Scale: Poor   Postural control: Posterior lean Standing balance support: During functional activity, Reliant on assistive device for balance, Bilateral upper extremity supported Standing balance-Leahy Scale: Zero Standing balance comment: reliant on UEs and external support                             Pertinent Vitals/Pain Pain Assessment Pain Assessment: Faces  Faces Pain Scale: Hurts worst Pain Descriptors / Indicators: Aching, Constant, Sore Pain Intervention(s): Limited activity within patient's tolerance, Monitored during session, Premedicated before session    Home Living Family/patient expects to be discharged to:: Private residence Living Arrangements:  Spouse/significant other Available Help at Discharge: Family Type of Home: House Home Access: Stairs to enter Entrance Stairs-Rails: Left Entrance Stairs-Number of Steps: 2   Home Layout: One level Home Equipment: Agricultural consultant (2 wheels);Cane - single point;Grab bars - tub/shower;BSC/3in1      Prior Function               Mobility Comments: 7 ambulated household distances without AD, no other falls besides this one in the past  7 months, driving ADLs Comments: spouse primarily did grocery shopping, pt did not require assistance for bathing or dressing, mostly sponge  bathes     Extremity/Trunk Assessment   Upper Extremity Assessment Upper Extremity Assessment: Defer to OT evaluation    Lower Extremity Assessment Lower Extremity Assessment:  (AROM grossly WFL, strength grossly 3/5, testing limited by pain)       Communication   Communication Communication: No apparent difficulties    Cognition Arousal: Alert Behavior During Therapy: WFL for tasks assessed/performed   PT - Cognitive impairments: pt with STM deficits, repeats same question repeatedly; spouse reports he is beginning to notice changes with memory at home                         Following commands: Intact       Cueing Cueing Techniques: Verbal cues     General Comments      Exercises     Assessment/Plan    PT Assessment Patient needs continued PT services  PT Problem List         PT Treatment Interventions DME instruction;Therapeutic exercise;Gait training;Functional mobility training;Therapeutic activities;Patient/family education;Balance training    PT Goals (Current goals can be found in the Care Plan section)  Acute Rehab PT Goals Patient Stated Goal: rehab PT Goal Formulation: With family Time For Goal Achievement: 09/12/24    Frequency Min 3X/week     Co-evaluation               AM-PAC PT 6 Clicks Mobility  Outcome Measure Help needed turning from your  back to your side while in a flat bed without using bedrails?: Total Help needed moving from lying on your back to sitting on the side of a flat bed without using bedrails?: Total Help needed moving to and from a bed to a chair (including a wheelchair)?: Total Help needed standing up from a chair using your arms (e.g., wheelchair or bedside chair)?: Total Help needed to walk in hospital room?: Total Help needed climbing 3-5 steps with a railing? : Total 6 Click Score: 6    End of Session Equipment Utilized During Treatment: Gait belt Activity Tolerance: Patient limited by pain Patient left: in chair;with call bell/phone within reach;with chair alarm set;with family/visitor present Nurse Communication: Mobility status PT Visit Diagnosis: Other abnormalities of gait and mobility (R26.89);Muscle weakness (generalized) (M62.81);History of falling (Z91.81);Pain Pain - part of body:  (back)    Time: 8776-8753 PT Time Calculation (min) (ACUTE ONLY): 23 min   Charges:   PT Evaluation $PT Eval Low Complexity: 1 Low PT Treatments $Therapeutic Activity: 8-22 mins PT General Charges $$ ACUTE PT VISIT: 1 Visit         Rexene, PT  Acute Rehab Dept ([facility]) [phone]  08/30/2024   Flynn Gwyn 08/30/2024, 1:19 PM

## 2024-08-30 NOTE — Evaluation (Signed)
 Occupational Therapy Evaluation Patient Details Name: Janet Boyer MRN: [id number] DOB: January 06, 1941 Today's Date: 08/30/2024   History of Present Illness   83 y.o. female presented with a mechanical fall at home roughly a week ago prior to presentation. on admission pt with worsening lower back pain; imaging revealed T12 compression fracture with about 60 to 70% height loss with mild L2 and L3 spinal stenosis and acute sacral fracture. ED provider spoke to on-call neurosurgery and orthopedics who recommended conservative management and no surgical intervention.   PMH: hypertension, hyperlipidemia, trigeminal neuralgia, depression, anxiety, IM nail 12/15/23, hyponatremia.     Clinical Impressions Pt c/o significant pain to low back/sacrum, anxious throughout session, sister present during session. Pt live with husband who is available 24/7, PLOF independent without AD. Pt currently with significant limitations due to c/o severe pain in low back/sacrum. Pt declined therapy multiple times today due to pain, even with pain meds Pt c/o 10/10 pain with movement. Pt mod/max A x2 for stand pivot from recliner to bed, total for LB ADLs, very anxious throughout session even when at bed level. Pt A/Ox4, but very forgetful throughout session, forgetting if purewick was placed multiple times, speaking to husband on phone who said he was on his way to hospital, then later forgetting, suspect due to IV pain meds?? Pt would benefit from continued acute OT to maximize participation in ADLs and upright activities, DC to postacute rehab <3hrs/day recommended.      If plan is discharge home, recommend the following:   Two people to help with walking and/or transfers;A lot of help with bathing/dressing/bathroom;Assistance with cooking/housework;Assist for transportation;Help with stairs or ramp for entrance;Direct supervision/assist for medications management     Functional Status Assessment   Patient has  had a recent decline in their functional status and demonstrates the ability to make significant improvements in function in a reasonable and predictable amount of time.     Equipment Recommendations   Other (comment) (defer)     Recommendations for Other Services         Precautions/Restrictions   Precautions Precautions: Fall Recall of Precautions/Restrictions: Impaired Restrictions Weight Bearing Restrictions Per Provider Order: No     Mobility Bed Mobility Overal bed mobility: Needs Assistance Bed Mobility: Sit to Supine       Sit to supine: Max assist   General bed mobility comments: max A back to bed for BLEs    Transfers Overall transfer level: Needs assistance Equipment used: 2 person hand held assist Transfers: Sit to/from Stand, Bed to chair/wheelchair/BSC Sit to Stand: Mod assist, +2 physical assistance Stand pivot transfers: Max assist, +2 physical assistance         General transfer comment: mod A to power STS, max A for balance and pivoting, limited due to pain      Balance Overall balance assessment: Needs assistance, History of Falls Sitting-balance support: Bilateral upper extremity supported, Feet supported Sitting balance-Leahy Scale: Poor Sitting balance - Comments: limited due to pain when sitting Postural control: Posterior lean Standing balance support: During functional activity, Reliant on assistive device for balance Standing balance-Leahy Scale: Zero Standing balance comment: reliant on UEs and external support                           ADL either performed or assessed with clinical judgement   ADL Overall ADL's : Needs assistance/impaired Eating/Feeding: Set up;Sitting   Grooming: Set up;Sitting   Upper Body Bathing: Moderate assistance;Sitting;Bed  level   Lower Body Bathing: Total assistance;Sitting/lateral leans;Bed level   Upper Body Dressing : Moderate assistance;Sitting;Bed level   Lower Body  Dressing: Total assistance;Sitting/lateral leans;Bed level   Toilet Transfer: Maximal assistance;BSC/3in1   Toileting- Clothing Manipulation and Hygiene: Total assistance;Sitting/lateral lean;Bed level         General ADL Comments: Pt limited due to severe pain in low back/sacrum, max A x2 for stand pivot, 2HHA, bed level for most ADLs as sitting is too painful.     Vision Baseline Vision/History: 0 No visual deficits Ability to See in Adequate Light: 0 Adequate Patient Visual Report: No change from baseline       Perception         Praxis         Pertinent Vitals/Pain Pain Assessment Pain Assessment: Faces Faces Pain Scale: Hurts whole lot Pain Location: low back Pain Descriptors / Indicators: Aching, Constant, Sore Pain Intervention(s): Monitored during session     Extremity/Trunk Assessment Upper Extremity Assessment Upper Extremity Assessment: Overall WFL for tasks assessed;RUE deficits/detail;LUE deficits/detail RUE Deficits / Details: WFLs, good ROM/strength, brusing to elbow likely from recent fall LUE Deficits / Details: WFLs, good ROM/strength, a couple of small bruises on forearm, likely from recent fall.   Lower Extremity Assessment Lower Extremity Assessment:  (AROM grossly WFL, strength grossly 3/5, testing limited by pain)       Communication Communication Communication: Impaired Factors Affecting Communication: Hearing impaired   Cognition Arousal: Alert Behavior During Therapy: Anxious Cognition: Cognition impaired             OT - Cognition Comments: Pt A/O x4, but very anxious, forgetful during session, frequently forgetting that her purewick was in, forgetting what we were doing, but able to fully participate.                 Following commands: Intact       Cueing  General Comments   Cueing Techniques: Verbal cues      Exercises     Shoulder Instructions      Home Living Family/patient expects to be discharged to::  Private residence Living Arrangements: Spouse/significant other Available Help at Discharge: Family;Available 24 hours/day Type of Home: House Home Access: Stairs to enter [employer] of Steps: 2 Entrance Stairs-Rails: Left Home Layout: One level     Bathroom Shower/Tub: Producer, television/film/video: Handicapped height     Home Equipment: Agricultural consultant (2 wheels);Cane - single point;Grab bars - tub/shower;BSC/3in1          Prior Functioning/Environment Prior Level of Function : Independent/Modified Independent             Mobility Comments: no AD ADLs Comments: spouse primarily did grocery shopping, pt did not require assistance for bathing or dressing, mostly sponge  bathes    OT Problem List: Decreased strength;Decreased activity tolerance;Decreased range of motion;Impaired balance (sitting and/or standing);Decreased cognition;Decreased safety awareness;Pain   OT Treatment/Interventions: Self-care/ADL training;Therapeutic exercise;Energy conservation;DME and/or AE instruction;Therapeutic activities;Patient/family education;Balance training      OT Goals(Current goals can be found in the care plan section)   Acute Rehab OT Goals Patient Stated Goal: to manage pain OT Goal Formulation: With patient/family Time For Goal Achievement: 09/13/24 Potential to Achieve Goals: Good   OT Frequency:  Min 2X/week    Co-evaluation              AM-PAC OT 6 Clicks Daily Activity     Outcome Measure Help from another person eating meals?: A  Little Help from another person taking care of personal grooming?: A Little Help from another person toileting, which includes using toliet, bedpan, or urinal?: A Lot Help from another person bathing (including washing, rinsing, drying)?: A Lot Help from another person to put on and taking off regular upper body clothing?: A Lot Help from another person to put on and taking off regular lower body clothing?: Total 6  Click Score: 13   End of Session Equipment Utilized During Treatment: Gait belt Nurse Communication: Mobility status  Activity Tolerance: Patient limited by pain Patient left: in bed;with call bell/phone within reach;with bed alarm set;with family/visitor present  OT Visit Diagnosis: Unsteadiness on feet (R26.81);Other abnormalities of gait and mobility (R26.89);Muscle weakness (generalized) (M62.81);Pain;Other symptoms and signs involving cognitive function Pain - part of body:  (low back)                Time: 1450-1517 OT Time Calculation (min): 27 min Charges:  OT General Charges $OT Visit: 1 Visit OT Evaluation $OT Eval Moderate Complexity: 1 Mod OT Treatments $Self Care/Home Management : 8-22 mins  [address] OTR/L   Elouise JONELLE Bott 08/30/2024, 3:24 PM

## 2024-08-30 NOTE — Progress Notes (Signed)
 PROGRESS NOTE    Janet Boyer  FMW:[id number] DOB: November 09, 1941 DOA: 08/29/2024 PCP: Yolande Toribio MATSU, MD   Brief Narrative:  83 y.o. female with medical history significant of hypertension, hyperlipidemia, trigeminal neuralgia presented with a mechanical fall at home roughly a week ago prior to presentation with worsening lower back pain for the last 1 to 2 days prior to presentation.  On presentation, imaging revealed T12 compression fracture with about 60 to 70% height loss with mild L2 and L3 spinal stenosis and acute sacral fracture.  ED provider spoke to on-call neurosurgery and orthopedics who recommended conservative management and no surgical intervention.  Assessment & Plan:   T12 compression fracture Acute sacral fracture Lumbar stenosis Mechanical fall - Presented with mechanical fall with imaging as above. -ED provider spoke to on-call neurosurgery and orthopedics who recommended conservative management and no surgical intervention. -Fall precautions.  PT/OT eval.  Weightbearing as tolerated -Continue pain management.  Hyponatremia -Mild.  Monitor  Hypokalemia -Resolved  Acute metabolic acidosis -Resolved  Hyperlipidemia Hypertension -Continue metoprolol  succinate and statin  Possible anemia of chronic disease/normocytic anemia - From chronic illnesses.  Hemoglobin stable.  No signs of bleeding.  Monitor intermittently.  History of trigeminal neuralgia - Continue carbamazepine    DVT prophylaxis: Lovenox  Code Status: Full Family Communication: Husband at bedside Disposition Plan: Status is: Observation The patient will require care spanning > 2 midnights and should be moved to inpatient because: Of severity of illness    Consultants: ED provider spoke to neurosurgery and orthopedics/Dr. Elsa on-call on admission  Procedures: None  Antimicrobials: None   Subjective: Patient seen and examined at bedside.  Complains of intermittent back pain.   Has not worked with physical therapy yet.  No fever, vomiting, worsening Abrol pain reported.  Objective: Vitals:   08/29/24 2153 08/29/24 2204 08/30/24 0253 08/30/24 0544  BP: (!) 118/90  135/61 (!) 141/53  Pulse: 77  60 62  Resp: 17  18 14   Temp: (!) 97.5 F (36.4 C)  97.7 F (36.5 C) 98.5 F (36.9 C)  TempSrc: Oral  Oral Oral  SpO2: 98%  95% 93%  Weight:  65.8 kg    Height:  5' 7 (1.702 m)      Intake/Output Summary (Last 24 hours) at 08/30/2024 1042 Last data filed at 08/30/2024 0600 Gross per 24 hour  Intake 300 ml  Output 0 ml  Net 300 ml   Filed Weights   08/29/24 2204  Weight: 65.8 kg    Examination:  General exam: Appears calm and comfortable  Respiratory system: Bilateral decreased breath sounds at bases Cardiovascular system: S1 & S2 heard, Rate controlled Gastrointestinal system: Abdomen is nondistended, soft and nontender. Normal bowel sounds heard. Extremities: No cyanosis, clubbing, edema    Data Reviewed: I have personally reviewed following labs and imaging studies  CBC: Recent Labs  Lab 08/29/24 0656 08/30/24 0346  WBC 8.0 6.7  NEUTROABS 6.4  --   HGB 11.9* 10.8*  HCT 36.1 32.8*  MCV 93.8 96.5  PLT 234 197   Basic Metabolic Panel: Recent Labs  Lab 08/29/24 0656 08/29/24 1604 08/30/24 0346  NA 132*  --  134*  K 3.4*  --  3.5  CL 96*  --  100  CO2 21*  --  23  GLUCOSE 146*  --  104*  BUN 21  --  20  CREATININE 0.97  --  0.73  CALCIUM 9.0  --  8.7*  MG  --  2.3  --  GFR: Estimated Creatinine Clearance: 52.7 mL/min (by C-G formula based on SCr of 0.73 mg/dL). Liver Function Tests: Recent Labs  Lab 08/30/24 0346  AST 20  ALT 10  ALKPHOS 85  BILITOT 0.5  PROT 6.5  ALBUMIN 3.7   No results for input(s): LIPASE, AMYLASE in the last 168 hours. No results for input(s): AMMONIA in the last 168 hours. Coagulation Profile: No results for input(s): INR, PROTIME in the last 168 hours. Cardiac Enzymes: No results  for input(s): CKTOTAL, CKMB, CKMBINDEX, TROPONINI in the last 168 hours. BNP (last 3 results) No results for input(s): PROBNP in the last 8760 hours. HbA1C: No results for input(s): HGBA1C in the last 72 hours. CBG: No results for input(s): GLUCAP in the last 168 hours. Lipid Profile: No results for input(s): CHOL, HDL, LDLCALC, TRIG, CHOLHDL, LDLDIRECT in the last 72 hours. Thyroid  Function Tests: No results for input(s): TSH, T4TOTAL, FREET4, T3FREE, THYROIDAB in the last 72 hours. Anemia Panel: No results for input(s): VITAMINB12, FOLATE, FERRITIN, TIBC, IRON, RETICCTPCT in the last 72 hours. Sepsis Labs: No results for input(s): PROCALCITON, LATICACIDVEN in the last 168 hours.  No results found for this or any previous visit (from the past 240 hours).       Radiology Studies: MR PELVIS WO CONTRAST Result Date: 08/29/2024 CLINICAL DATA:  Low back and pelvic pain since falling last week. EXAM: MRI PELVIS WITHOUT CONTRAST TECHNIQUE: Multiplanar multisequence MR imaging of the pelvis was performed. No intravenous contrast was administered. COMPARISON:  CT lumbar spine and pelvis 08/29/2024. Hip radiographs 12/14/2023. FINDINGS: Bones/Joint/Cartilage There are acute/subacute, mildly displaced fractures of both sacral ala, worse on the right. Associated bone marrow and surrounding soft tissue edema, asymmetric to the right. There are nondisplaced left parasymphyseal fractures with associated marrow edema. No other pelvic fractures are identified, and the sacroiliac joints and symphysis pubis are intact. There are postsurgical changes from proximal right femoral ORIF without evidence of acute osseous abnormality. The previously demonstrated right intertrochanteric femur fracture appears healed. However, there is serpiginous low T1 signal within the right femoral head, suspicious for underlying osteonecrosis. Mild associated right hip  degenerative changes without significant joint effusion. No evidence of left femoral head osteonecrosis or significant joint effusion. Lumbar spine findings dictated separately. Ligaments No significant ligamentous abnormalities are identified. Muscles and Tendons Atrophy of the erector spinae musculature inferiorly and the right hip musculature. Asymmetric edema tracks along the right iliac bone and iliacus muscle, attributed to the sacral fractures. No acute tendon abnormalities are identified. Soft tissue As above, presacral edema asymmetric to the right, attributed to the sacral fractures. No evidence of hip periarticular hematoma. Mild distal colonic diverticulosis. IMPRESSION: 1. Acute/subacute, mildly displaced fractures of both sacral ala, worse on the right. 2. Nondisplaced left parasymphyseal fractures. 3. Postsurgical changes from proximal right femoral ORIF. The previously demonstrated right intertrochanteric femur fracture appears healed. Findings suspicious for underlying right femoral head osteonecrosis. 4. Lumbar spine findings dictated separately. Electronically Signed   By: Elsie Perone M.D.   On: 08/29/2024 12:29   MR LUMBAR SPINE WO CONTRAST Result Date: 08/29/2024 CLINICAL DATA:  Low back pain since falling last week. Unable to stand without assistance today and increased pain. EXAM: MRI LUMBAR SPINE WITHOUT CONTRAST TECHNIQUE: Multiplanar, multisequence MR imaging of the lumbar spine was performed. No intravenous contrast was administered. COMPARISON:  CT lumbar spine 08/29/2024.  Lumbar MRI 11/21/2011. FINDINGS: Segmentation: Conventional anatomy assumed, with the last open disc space designated L5-S1.Concordant with prior imaging. Alignment: Minimal convex  left scoliosis. Fused degenerative grade 1 anterolisthesis at L4-5. Vertebrae: No evidence of acute lumbar spine fracture or traumatic subluxation. As seen on the recent lumbar spine CT, there is a nonacute severe compression  fracture at T12 which is healed, without residual marrow edema. This fracture is associated with 65% loss of vertebral body height and 4 mm of osseous retropulsion. There is an acute sacral fracture, further described on separate examination of the pelvis. There are postsurgical changes from L4-5 PLIF. Conus medullaris: Extends to the L1-2 level. The conus and cauda equina appear normal. Paraspinal and other soft tissues: No significant lumbar paraspinal findings. There is presacral edema attributed to the sacral fractures. Bilateral renal cysts for which no specific follow-up imaging is recommended. Disc levels: T11-12: Mild disc bulging with osseous retropulsion. No cord deformity or significant foraminal narrowing. T12-L1: Mild disc bulging. No spinal stenosis or significant foraminal narrowing. L1-2: No significant findings. L2-3: Mild disc bulging with mild facet and ligamentous hypertrophy. Resulting mild spinal stenosis with mild lateral recess narrowing bilaterally. No nerve root impingement. L3-4: Preserved disc height with mild disc bulging, facet and ligamentous hypertrophy. No significant spinal stenosis or foraminal narrowing. L4-5: Status post posterior decompression and PLIF. The spinal canal and foramina are widely patent. L5-S1: Preserved disc height. Mild bilateral facet hypertrophy. No significant spinal stenosis or foraminal narrowing. IMPRESSION: 1. Acute sacral fracture, further described on separate examination of the pelvis. 2. No acute lumbar spine fracture or traumatic subluxation. 3. Nonacute severe T12 compression fracture. 4. Mild multifactorial spinal stenosis at L2-3. 5. Postsurgical changes at L4-5 without residual spinal stenosis or nerve root impingement. Electronically Signed   By: Elsie Perone M.D.   On: 08/29/2024 12:17   CT PELVIS WO CONTRAST Result Date: 08/29/2024 EXAM: CT Pelvis, Without IV Contrast 08/29/2024 08:18:26 AM TECHNIQUE: Axial images were acquired through  the pelvis without IV contrast. Reformatted images were reviewed. Automated exposure control, iterative reconstruction, and/or weight based adjustment of the mA/kV was utilized to reduce the radiation dose to as low as reasonably achievable. COMPARISON: Right hip series dated 12/14/2023. CLINICAL HISTORY: Hip trauma, fracture suspected, no prior imaging. Patient arrives EMS from home with reports of fall last week. Patient has had lower back pain/tail bone pain since the fall. Patient has been ambulatory with walker but this morning was unable to stand without assistance and has had increased pain. FINDINGS: BONES: Since the previous study, the patient has undergone open reduction and internal fixation of the right femoral head and neck. An intramedullary screw is present within the femoral head and neck and an interlocking rod within the proximal femoral shaft. There is no apparent acute fracture. The bony pelvis is intact. JOINTS: No dislocation. The joint spaces are normal. SOFT TISSUES: The soft tissues are unremarkable. INTRAPELVIC CONTENTS: Limited images of the intrapelvic contents demonstrate numerous sigmoid diverticula. IMPRESSION: 1. No acute osseous abnormality. 2. Status post open reduction and internal fixation of the right femoral head and neck with intramedullary screw and interlocking rod within the proximal femoral shaft. Electronically signed by: Evalene Coho MD 08/29/2024 08:36 AM EDT RP Workstation: [id number]   CT Lumbar Spine Wo Contrast Result Date: 08/29/2024 EXAM: CT OF THE LUMBAR SPINE WITHOUT CONTRAST 08/29/2024 08:18:26 AM TECHNIQUE: CT of the lumbar spine was performed without the administration of intravenous contrast. Multiplanar reformatted images are provided for review. Automated exposure control, iterative reconstruction, and/or weight based adjustment of the mA/kV was utilized to reduce the radiation dose to as low as reasonably  achievable. COMPARISON: CT of the lumbar  spine dated 11/24/2013. CLINICAL HISTORY: Back trauma, no prior imaging (Age >= 16y); Lumbar radiculopathy, trauma. Pt arrives EMS from home with reports of fall last week. Pt has had lower back pain/ tail bone pain since the fall. Pt has been ambulatory with walker but this morning was unable to stand without assistance and has had increased pain. FINDINGS: BONES AND ALIGNMENT: Mild levoscoliotic curvature of the thoracolumbar spine again demonstrated. Since the previous study, the patient has developed a moderate chronic compression deformity of T12. The vertebral body has lost approximately 60 to 70% of its height anteriorly and there is buckling of the posterior superior corner of the vertebral body which is causing moderate central spinal canal stenosis. The patient has also undergone decompression laminectomies, interbody fusion and bilateral posterolateral spinal fixation at L4-L5. There is persistent grade 1 anterolisthesis at this level. Normal vertebral body heights for L1, L2, L3, L5, and S1. No acute fracture or suspicious bone lesion. DEGENERATIVE CHANGES: At L2-L3, there is mild diffuse disc bulging with mild central spinal canal stenosis and bilateral lateral recess stenosis. There are mild facet hypertrophic changes present at L3-L4. There is mild central spinal canal stenosis. At L5-S1, there is moderate bilateral facet arthrosis. The spinal canal and neural foramina appear widely patent. SOFT TISSUES: Aorta. There is a cyst with a partially calcified rim arising from the left kidney. There is also a simple-appearing cyst arising laterally from the lower pole of the right kidney. No further follow up of the cyst is necessary. No acute abnormality. IMPRESSION: 1. Moderate chronic compression deformity of T12 with approximately 60-70% anterior height loss and buckling of the posterior superior corner, causing moderate central spinal canal stenosis. 2. Status post decompression laminectomies,  interbody fusion, and bilateral posterolateral spinal fixation at L4-5 with persistent grade 1 anterolisthesis. 3. Mild central spinal canal stenosis and bilateral lateral recess stenosis at L2-3. 4. Mild central spinal canal stenosis at L3-4. Electronically signed by: Evalene Coho MD 08/29/2024 08:32 AM EDT RP Workstation: [id number]        Scheduled Meds:  carbamazepine   100 mg Oral QHS   enoxaparin  (LOVENOX ) injection  40 mg Subcutaneous Q24H   metoprolol  succinate  50 mg Oral Daily   pravastatin   40 mg Oral q1800   Continuous Infusions:        Sophie Mao, MD [facility] 08/30/2024, 10:42 AM

## 2024-08-30 NOTE — Care Management Obs Status (Signed)
 MEDICARE OBSERVATION STATUS NOTIFICATION   Patient Details  Name: SHAUGHNESSY GETHERS MRN: [id number] Date of Birth: May 03, 1941   Medicare Observation Status Notification Given:  Yes    Alfonse JONELLE Rex, RN 08/30/2024, 3:28 PM

## 2024-08-30 NOTE — NC FL2 (Signed)
 [place]  MEDICAID FL2 LEVEL OF CARE FORM     IDENTIFICATION  Patient Name: Janet Boyer Birthdate: 1941/01/14 Sex: female Admission Date (Current Location): 08/29/2024  [facility] and [place] Number:  Producer, television/film/video and Address:  [facility],  [address], [place] [address]      Provider Number: [id number]  Attending Physician Name and Address:  Cheryle Page, MD  Relative Name and Phone Number:  Thore,Chris (Spouse)  [phone] (Mobile)    Current Level of Care: Hospital Recommended Level of Care: Skilled Nursing Facility Prior Approval Number:    Date Approved/Denied:   PASRR Number: [id number]  Discharge Plan: SNF    Current Diagnoses: Patient Active Problem List   Diagnosis Date Noted   Hip fracture (HCC) 08/29/2024   Sacral fracture (HCC) 08/29/2024   HLD (hyperlipidemia) 08/29/2024   Closed right hip fracture, initial encounter (HCC) 12/15/2023   Hypokalemia 12/15/2023   Hypertension    Hyponatremia    Trigeminal neuralgia    S/P lumbar spinal fusion 12/30/2013    Orientation RESPIRATION BLADDER Height & Weight     Self, Time, Situation, Place  Normal Incontinent, External catheter Weight: 65.8 kg Height:  5' 7 (170.2 cm)  BEHAVIORAL SYMPTOMS/MOOD NEUROLOGICAL BOWEL NUTRITION STATUS      Continent Diet (regular)  AMBULATORY STATUS COMMUNICATION OF NEEDS Skin   Extensive Assist Verbally Other (Comment) (erythema/echymosis: elbow, left arm, sacrum)                       Personal Care Assistance Level of Assistance  Bathing, Feeding, Dressing Bathing Assistance: Limited assistance Feeding assistance: Limited assistance Dressing Assistance: Limited assistance     Functional Limitations Info  Sight, Hearing, Speech Sight Info: Impaired (eyeglasses) Hearing Info: Impaired (hard of hearing) Speech Info: Adequate    SPECIAL CARE FACTORS FREQUENCY  PT (By licensed PT), OT (By licensed OT)     PT Frequency:  5x/wk OT Frequency: 5x/wk            Contractures Contractures Info: Not present    Additional Factors Info  Code Status, Allergies, Psychotropic Code Status Info: Full Code Allergies Info: Amoxicillin, Other, Fluoxetine Hcl Psychotropic Info: N/A         Current Medications (08/30/2024):  This is the current hospital active medication list Current Facility-Administered Medications  Medication Dose Route Frequency Provider Last Rate Last Admin   bisacodyl  (DULCOLAX) suppository 10 mg  10 mg Rectal Daily PRN Cheryle Page, MD       carbamazepine  (TEGRETOL ) chewable tablet 100 mg  100 mg Oral QHS Eldonna Elspeth PARAS, MD   100 mg at 08/29/24 2206   enoxaparin  (LOVENOX ) injection 40 mg  40 mg Subcutaneous Q24H Eldonna Elspeth PARAS, MD   40 mg at 08/29/24 1507   HYDROmorphone  (DILAUDID ) injection 1 mg  1 mg Intravenous Q3H PRN Cheryle Page, MD       metoprolol  succinate (TOPROL -XL) 24 hr tablet 50 mg  50 mg Oral Daily Eldonna Elspeth PARAS, MD   50 mg at 08/30/24 0952   naloxone (NARCAN) injection 0.4 mg  0.4 mg Intravenous PRN Eldonna Elspeth PARAS, MD       oxyCODONE  (Oxy IR/ROXICODONE ) immediate release tablet 5-10 mg  5-10 mg Oral Q4H PRN Cheryle Page, MD   10 mg at 08/30/24 1201   polyethylene glycol (MIRALAX  / GLYCOLAX ) packet 17 g  17 g Oral Daily PRN Cheryle Page, MD       pravastatin  (PRAVACHOL ) tablet 40  mg  40 mg Oral q1800 Eldonna Elspeth PARAS, MD   40 mg at 08/29/24 2158   senna-docusate (Senokot-S) tablet 1 tablet  1 tablet Oral BID Cheryle Page, MD   1 tablet at 08/30/24 1350     Discharge Medications: Please see discharge summary for a list of discharge medications.  Relevant Imaging Results:  Relevant Lab Results:   Additional Information SSN: [id number]  Alfonse JONELLE Rex, RN

## 2024-08-30 NOTE — Progress Notes (Signed)
 OT Cancellation Note  Patient Details Name: PLACIDA CAMBRE MRN: [id number] DOB: [birth year]   Cancelled Treatment:    Reason Eval/Treat Not Completed: (P) Pain limiting ability to participate, stopped by 2X today, Pt c/o too much pain to participate. Pt just given meds, will check back in 1-2 hours for meds to kick in.  Elouise JONELLE Bott 08/30/2024, 12:16 PM

## 2024-08-30 NOTE — TOC Initial Note (Addendum)
 Transition of Care [facility]) - Initial/Assessment Note    Patient Details  Name: Janet Boyer MRN: [id number] Date of Birth: Oct 09, 1941  [facility]) CM/SW Contact:    Alfonse JONELLE Rex, RN Phone Number: 08/30/2024, 3:36 PM  Clinical Narrative:  Met with patient, spouse and patient's sister at bedside to introduce role of TOC/NCM and review for dc planning, PT recommendation for short term rehab/SNF, pt/spouse agreeable, prefers [place], but open to other facilities. Patient has an established PCP no current home care services , Home DME: RW, Cane, BSC, Rollator, Wheelchair. FL2 updated, faxed out for bed offers.   MOON completed, patient gave verbal ok for spouse to sign form.                 Expected Discharge Plan: Skilled Nursing Facility Barriers to Discharge: Continued Medical Work up   Patient Goals and CMS Choice Patient states their goals for this hospitalization and ongoing recovery are:: return home CMS Medicare.gov Compare Post Acute Care list provided to:: Patient Choice offered to / list presented to : Patient [place] ownership interest in [facility].provided to:: Patient    Expected Discharge Plan and Services       Living arrangements for the past 2 months: Single Family Home                                      Prior Living Arrangements/Services Living arrangements for the past 2 months: Single Family Home Lives with:: Spouse Patient language and need for interpreter reviewed:: Yes Do you feel safe going back to the place where you live?: Yes      Need for Family Participation in Patient Care: Yes (Comment) Care giver support system in place?: Yes (comment) Current home services: DME (RW, Cane, BSC, Rollator, Wheelchair) Criminal Activity/Legal Involvement Pertinent to Current Situation/Hospitalization: No - Comment as needed  Activities of Daily Living   ADL Screening (condition at time of admission) Independently  performs ADLs?: Yes (appropriate for developmental age) Is the patient deaf or have difficulty hearing?: Yes Does the patient have difficulty seeing, even when wearing glasses/contacts?: No Does the patient have difficulty concentrating, remembering, or making decisions?: No  Permission Sought/Granted                  Emotional Assessment Appearance:: Appears stated age Attitude/Demeanor/Rapport: Gracious Affect (typically observed): Accepting Orientation: : Oriented to Self, Oriented to Place, Oriented to  Time, Oriented to Situation Alcohol  / Substance Use: Not Applicable Psych Involvement: No (comment)  Admission diagnosis:  Hip fracture (HCC) [S72.009A] Pain management [R52] Closed fracture of sacrum, unspecified portion of sacrum, initial encounter (HCC) [S32.10XA] Patient Active Problem List   Diagnosis Date Noted   Hip fracture (HCC) 08/29/2024   Sacral fracture (HCC) 08/29/2024   HLD (hyperlipidemia) 08/29/2024   Closed right hip fracture, initial encounter (HCC) 12/15/2023   Hypokalemia 12/15/2023   Hypertension    Hyponatremia    Trigeminal neuralgia    S/P lumbar spinal fusion 12/30/2013   PCP:  Yolande Toribio MATSU, MD Pharmacy:   [id number] - [place], [place] - [address] AT [facility] [address] [place] [address] Phone: [phone] Fax: [phone]     Social Drivers of Health (SDOH) Social History: SDOH Screenings   Food Insecurity: No Food Insecurity (08/29/2024)  Housing: Low Risk  (08/29/2024)  Transportation Needs: No Transportation Needs (08/29/2024)  Utilities: Not At Risk (08/29/2024)  Social Connections: Moderately Isolated (08/29/2024)  Tobacco Use: Low Risk  (08/29/2024)   SDOH Interventions:     Readmission Risk Interventions    12/22/2023   11:30 AM  Readmission Risk Prevention Plan  Post Dischage Appt Complete  Medication Screening Complete  Transportation  Screening Complete

## 2024-08-31 DIAGNOSIS — S3210XA Unspecified fracture of sacrum, initial encounter for closed fracture: Secondary | ICD-10-CM | POA: Diagnosis not present

## 2024-08-31 DIAGNOSIS — R52 Pain, unspecified: Secondary | ICD-10-CM | POA: Diagnosis not present

## 2024-08-31 LAB — BASIC METABOLIC PANEL WITH GFR
Anion gap: 12 (ref 5–15)
BUN: 20 mg/dL (ref 8–23)
CO2: 23 mmol/L (ref 22–32)
Calcium: 8.6 mg/dL — ABNORMAL LOW (ref 8.9–10.3)
Chloride: 100 mmol/L (ref 98–111)
Creatinine, Ser: 0.72 mg/dL (ref 0.44–1.00)
GFR, Estimated: >60 mL/min (ref >60)
Glucose, Bld: 99 mg/dL (ref 70–99)
Potassium: 3.7 mmol/L (ref 3.5–5.1)
Sodium: 134 mmol/L — ABNORMAL LOW (ref 135–145)

## 2024-08-31 LAB — CBC WITH DIFFERENTIAL/PLATELET
Abs Immature Granulocytes: 0.03 K/uL (ref 0.00–0.07)
Basophils Absolute: 0.1 K/uL (ref 0.0–0.1)
Basophils Relative: 1 %
Eosinophils Absolute: 0.4 K/uL (ref 0.0–0.5)
Eosinophils Relative: 7 %
HCT: 33.4 % — ABNORMAL LOW (ref 36.0–46.0)
Hemoglobin: 10.7 g/dL — ABNORMAL LOW (ref 12.0–15.0)
Immature Granulocytes: 1 %
Lymphocytes Relative: 23 %
Lymphs Abs: 1.5 K/uL (ref 0.7–4.0)
MCH: 30.9 pg (ref 26.0–34.0)
MCHC: 32 g/dL (ref 30.0–36.0)
MCV: 96.5 fL (ref 80.0–100.0)
Monocytes Absolute: 0.7 K/uL (ref 0.1–1.0)
Monocytes Relative: 11 %
Neutro Abs: 3.9 K/uL (ref 1.7–7.7)
Neutrophils Relative %: 57 %
Platelets: 211 K/uL (ref 150–400)
RBC: 3.46 MIL/uL — ABNORMAL LOW (ref 3.87–5.11)
RDW: 13.6 % (ref 11.5–15.5)
WBC: 6.6 K/uL (ref 4.0–10.5)
nRBC: 0 % (ref 0.0–0.2)

## 2024-08-31 LAB — MAGNESIUM: Magnesium: 2.5 mg/dL — ABNORMAL HIGH (ref 1.7–2.4)

## 2024-08-31 MED ORDER — POLYETHYLENE GLYCOL 3350 17 G PO PACK
17.0000 g | PACK | Freq: Two times a day (BID) | ORAL | Status: AC
  Administered 2024-08-31 – 2024-09-01 (×3): 17 g via ORAL
  Filled 2024-08-31 (×3): qty 1

## 2024-08-31 MED ORDER — OXYCODONE HCL 5 MG PO TABS
15.0000 mg | ORAL_TABLET | ORAL | Status: DC | PRN
  Administered 2024-08-31 – 2024-09-01 (×3): 15 mg via ORAL
  Filled 2024-08-31 (×4): qty 3

## 2024-08-31 MED ORDER — OXYCODONE HCL 5 MG PO TABS: 10.0000 mg | ORAL_TABLET | ORAL | Status: DC | PRN

## 2024-08-31 NOTE — TOC Progression Note (Addendum)
 Transition of Care [facility]) - Progression Note    Patient Details  Name: Janet Boyer MRN: [id number] Date of Birth: 1941/07/18  Transition of Care [facility]) CM/SW Contact  Alfonse JONELLE Rex, RN Phone Number: 08/31/2024, 10:40 AM  Clinical Narrative:   Met with patient/spouse at bedside to review SNF bed offers [facility], [facility], [facility] [place]), patient/spouse accepted bed offer at [facility], [place] , admit  coord w/SNF notified. Teams chat sen to attending to confirm ok to start SNF auth.   -2:13pm Per attending, patient not ready for dc, still requiring iv pain medication. Will start SNF auth tomorrow.     Expected Discharge Plan: Skilled Nursing Facility Barriers to Discharge: Continued Medical Work up               Expected Discharge Plan and Services       Living arrangements for the past 2 months: Single Family Home                                       Social Drivers of Health (SDOH) Interventions SDOH Screenings   Food Insecurity: No Food Insecurity (08/29/2024)  Housing: Low Risk  (08/29/2024)  Transportation Needs: No Transportation Needs (08/29/2024)  Utilities: Not At Risk (08/29/2024)  Social Connections: Moderately Isolated (08/29/2024)  Tobacco Use: Low Risk  (08/29/2024)    Readmission Risk Interventions    12/22/2023   11:30 AM  Readmission Risk Prevention Plan  Post Dischage Appt Complete  Medication Screening Complete  Transportation Screening Complete

## 2024-08-31 NOTE — Progress Notes (Signed)
 [facility] PROGRESS NOTE    Progress Note  Janet Boyer  FMW:[id number] DOB: 29-May-1941 DOA: 08/29/2024 PCP: Janet Boyer     Brief Narrative:   Janet Boyer is an 83 y.o. female past medical history of essential hypertension hyperlipidemia, trigeminal neuralgia presents with a mechanical fall that happened about a week prior to admission imaging showed T12 compression fracture about 50 to 70% height loss with mild L2-L3 spinal stenosis and acute sacral fracture.  Neurosurgery was curb sided by the ED recommended conservative management and no surgical intervention.    Assessment/Plan:   T12 compression fracture/acute sacral fracture secondary to mechanical fall: ED provider spoke with neurosurgery recommended conservative management and no surgical intervention. Pain control weightbearing as tolerated. PT evaluated the patient recommended skilled nursing facility placement.  Mild hyponatremia: Resolved with IV fluids.  Hypokalemia: Repleted orally now resolved.  Acute metabolic acidosis: Resolved with IV fluids.  Hyperlipidemia/hypertension: Continue metoprolol  and statins.  Normocytic anemia: Hemoglobin stable.  History of trigeminal neuralgia: Continue carbamazepine    DVT prophylaxis: lovenox  Family Communication:none Status is: Observation The patient remains OBS appropriate and will d/c before 2 midnights.    Code Status:     Code Status Orders  (From admission, onward)           Start     Ordered   08/29/24 1454  Full code  Continuous       Question:  By:  Answer:  Consent: discussion documented in EHR   08/29/24 1456           Code Status History     Date Active Date Inactive Code Status Order ID Comments User Context   12/15/2023 0119 12/22/2023 2246 Full Code [id number]  Charlton Evalene RAMAN, Boyer ED   12/30/2013 1401 01/01/2014 1635 Full Code [id number]  Joshua Alm RAMAN, Boyer Inpatient         IV Access:    Peripheral IV   Procedures and diagnostic studies:   MR PELVIS WO CONTRAST Result Date: 08/29/2024 CLINICAL DATA:  Low back and pelvic pain since falling last week. EXAM: MRI PELVIS WITHOUT CONTRAST TECHNIQUE: Multiplanar multisequence MR imaging of the pelvis was performed. No intravenous contrast was administered. COMPARISON:  CT lumbar spine and pelvis 08/29/2024. Hip radiographs 12/14/2023. FINDINGS: Bones/Joint/Cartilage There are acute/subacute, mildly displaced fractures of both sacral ala, worse on the right. Associated bone marrow and surrounding soft tissue edema, asymmetric to the right. There are nondisplaced left parasymphyseal fractures with associated marrow edema. No other pelvic fractures are identified, and the sacroiliac joints and symphysis pubis are intact. There are postsurgical changes from proximal right femoral ORIF without evidence of acute osseous abnormality. The previously demonstrated right intertrochanteric femur fracture appears healed. However, there is serpiginous low T1 signal within the right femoral head, suspicious for underlying osteonecrosis. Mild associated right hip degenerative changes without significant joint effusion. No evidence of left femoral head osteonecrosis or significant joint effusion. Lumbar spine findings dictated separately. Ligaments No significant ligamentous abnormalities are identified. Muscles and Tendons Atrophy of the erector spinae musculature inferiorly and the right hip musculature. Asymmetric edema tracks along the right iliac bone and iliacus muscle, attributed to the sacral fractures. No acute tendon abnormalities are identified. Soft tissue As above, presacral edema asymmetric to the right, attributed to the sacral fractures. No evidence of hip periarticular hematoma. Mild distal colonic diverticulosis. IMPRESSION: 1. Acute/subacute, mildly displaced fractures of both sacral ala, worse on the right. 2. Nondisplaced left parasymphyseal  fractures. 3.  Postsurgical changes from proximal right femoral ORIF. The previously demonstrated right intertrochanteric femur fracture appears healed. Findings suspicious for underlying right femoral head osteonecrosis. 4. Lumbar spine findings dictated separately. Electronically Signed   By: Elsie Perone M.D.   On: 08/29/2024 12:29   MR LUMBAR SPINE WO CONTRAST Result Date: 08/29/2024 CLINICAL DATA:  Low back pain since falling last week. Unable to stand without assistance today and increased pain. EXAM: MRI LUMBAR SPINE WITHOUT CONTRAST TECHNIQUE: Multiplanar, multisequence MR imaging of the lumbar spine was performed. No intravenous contrast was administered. COMPARISON:  CT lumbar spine 08/29/2024.  Lumbar MRI 11/21/2011. FINDINGS: Segmentation: Conventional anatomy assumed, with the last open disc space designated L5-S1.Concordant with prior imaging. Alignment: Minimal convex left scoliosis. Fused degenerative grade 1 anterolisthesis at L4-5. Vertebrae: No evidence of acute lumbar spine fracture or traumatic subluxation. As seen on the recent lumbar spine CT, there is a nonacute severe compression fracture at T12 which is healed, without residual marrow edema. This fracture is associated with 65% loss of vertebral body height and 4 mm of osseous retropulsion. There is an acute sacral fracture, further described on separate examination of the pelvis. There are postsurgical changes from L4-5 PLIF. Conus medullaris: Extends to the L1-2 level. The conus and cauda equina appear normal. Paraspinal and other soft tissues: No significant lumbar paraspinal findings. There is presacral edema attributed to the sacral fractures. Bilateral renal cysts for which no specific follow-up imaging is recommended. Disc levels: T11-12: Mild disc bulging with osseous retropulsion. No cord deformity or significant foraminal narrowing. T12-L1: Mild disc bulging. No spinal stenosis or significant foraminal narrowing. L1-2: No  significant findings. L2-3: Mild disc bulging with mild facet and ligamentous hypertrophy. Resulting mild spinal stenosis with mild lateral recess narrowing bilaterally. No nerve root impingement. L3-4: Preserved disc height with mild disc bulging, facet and ligamentous hypertrophy. No significant spinal stenosis or foraminal narrowing. L4-5: Status post posterior decompression and PLIF. The spinal canal and foramina are widely patent. L5-S1: Preserved disc height. Mild bilateral facet hypertrophy. No significant spinal stenosis or foraminal narrowing. IMPRESSION: 1. Acute sacral fracture, further described on separate examination of the pelvis. 2. No acute lumbar spine fracture or traumatic subluxation. 3. Nonacute severe T12 compression fracture. 4. Mild multifactorial spinal stenosis at L2-3. 5. Postsurgical changes at L4-5 without residual spinal stenosis or nerve root impingement. Electronically Signed   By: Elsie Perone M.D.   On: 08/29/2024 12:17     Medical Consultants:   None.   Subjective:    Penni LELON Guerin she relates her pain is controlled has not had a bowel movement.  Objective:    Vitals:   08/30/24 0544 08/30/24 1409 08/30/24 2152 08/31/24 0644  BP: (!) 141/53 (!) 156/76 (!) 137/59 (!) 136/59  Pulse: 62 66 65 60  Resp: 14 14 15 17   Temp: 98.5 F (36.9 C) 98.1 F (36.7 C) 97.9 F (36.6 C) 98 F (36.7 C)  TempSrc: Oral Oral Oral Oral  SpO2: 93% 96% 96% 99%  Weight:      Height:       SpO2: 99 % O2 Flow Rate (L/min): 4 L/min FiO2 (%): 100 %   Intake/Output Summary (Last 24 hours) at 08/31/2024 1036 Last data filed at 08/31/2024 9361 Gross per 24 hour  Intake 420 ml  Output 200 ml  Net 220 ml   Filed Weights   08/29/24 2204  Weight: 65.8 kg    Exam: General exam: In no acute distress. Respiratory system: Good air  movement and clear to auscultation. Cardiovascular system: S1 & S2 heard, RRR. No JVD.  Gastrointestinal system: Abdomen is  nondistended, soft and nontender.  Extremities: No pedal edema. Skin: No rashes, lesions or ulcers Psychiatry: Judgement and insight appear normal. Mood & affect appropriate.    Data Reviewed:    Labs: Basic Metabolic Panel: Recent Labs  Lab 08/29/24 0656 08/29/24 1604 08/30/24 0346 08/31/24 0351  NA 132*  --  134* 134*  K 3.4*  --  3.5 3.7  CL 96*  --  100 100  CO2 21*  --  23 23  GLUCOSE 146*  --  104* 99  BUN 21  --  20 20  CREATININE 0.97  --  0.73 0.72  CALCIUM 9.0  --  8.7* 8.6*  MG  --  2.3  --  2.5*   GFR Estimated Creatinine Clearance: 52.7 mL/min (by C-G formula based on SCr of 0.72 mg/dL). Liver Function Tests: Recent Labs  Lab 08/30/24 0346  AST 20  ALT 10  ALKPHOS 85  BILITOT 0.5  PROT 6.5  ALBUMIN 3.7   No results for input(s): LIPASE, AMYLASE in the last 168 hours. No results for input(s): AMMONIA in the last 168 hours. Coagulation profile No results for input(s): INR, PROTIME in the last 168 hours. COVID-19 Labs  No results for input(s): DDIMER, FERRITIN, LDH, CRP in the last 72 hours.  Lab Results  Component Value Date   SARSCOV2NAA NEGATIVE 06/25/2020    CBC: Recent Labs  Lab 08/29/24 0656 08/30/24 0346 08/31/24 0351  WBC 8.0 6.7 6.6  NEUTROABS 6.4  --  3.9  HGB 11.9* 10.8* 10.7*  HCT 36.1 32.8* 33.4*  MCV 93.8 96.5 96.5  PLT 234 197 211   Cardiac Enzymes: No results for input(s): CKTOTAL, CKMB, CKMBINDEX, TROPONINI in the last 168 hours. BNP (last 3 results) No results for input(s): PROBNP in the last 8760 hours. CBG: No results for input(s): GLUCAP in the last 168 hours. D-Dimer: No results for input(s): DDIMER in the last 72 hours. Hgb A1c: No results for input(s): HGBA1C in the last 72 hours. Lipid Profile: No results for input(s): CHOL, HDL, LDLCALC, TRIG, CHOLHDL, LDLDIRECT in the last 72 hours. Thyroid  function studies: No results for input(s): TSH, T4TOTAL,  T3FREE, THYROIDAB in the last 72 hours.  Invalid input(s): FREET3 Anemia work up: No results for input(s): VITAMINB12, FOLATE, FERRITIN, TIBC, IRON, RETICCTPCT in the last 72 hours. Sepsis Labs: Recent Labs  Lab 08/29/24 0656 08/30/24 0346 08/31/24 0351  WBC 8.0 6.7 6.6   Microbiology No results found for this or any previous visit (from the past 240 hours).   Medications:    carbamazepine   100 mg Oral QHS   enoxaparin  (LOVENOX ) injection  40 mg Subcutaneous Q24H   metoprolol  succinate  50 mg Oral Daily   pravastatin   40 mg Oral q1800   senna-docusate  1 tablet Oral BID   Continuous Infusions:    LOS: 0 days   Erle Odell Castor  [facility]  08/31/2024, 10:36 AM

## 2024-08-31 NOTE — Plan of Care (Signed)
  Problem: Safety: Goal: Ability to remain free from injury will improve Outcome: Progressing   Problem: Pain Managment: Goal: General experience of comfort will improve and/or be controlled Outcome: Progressing   Problem: Elimination: Goal: Will not experience complications related to urinary retention Outcome: Progressing   Problem: Coping: Goal: Level of anxiety will decrease Outcome: Progressing

## 2024-09-01 DIAGNOSIS — R52 Pain, unspecified: Secondary | ICD-10-CM

## 2024-09-01 DIAGNOSIS — G500 Trigeminal neuralgia: Secondary | ICD-10-CM | POA: Diagnosis present

## 2024-09-01 DIAGNOSIS — E7800 Pure hypercholesterolemia, unspecified: Secondary | ICD-10-CM | POA: Diagnosis present

## 2024-09-01 DIAGNOSIS — F419 Anxiety disorder, unspecified: Secondary | ICD-10-CM | POA: Diagnosis present

## 2024-09-01 DIAGNOSIS — Y92009 Unspecified place in unspecified non-institutional (private) residence as the place of occurrence of the external cause: Secondary | ICD-10-CM | POA: Diagnosis not present

## 2024-09-01 DIAGNOSIS — Z87440 Personal history of urinary (tract) infections: Secondary | ICD-10-CM | POA: Diagnosis not present

## 2024-09-01 DIAGNOSIS — Z91018 Allergy to other foods: Secondary | ICD-10-CM | POA: Diagnosis not present

## 2024-09-01 DIAGNOSIS — E876 Hypokalemia: Secondary | ICD-10-CM | POA: Diagnosis present

## 2024-09-01 DIAGNOSIS — G8929 Other chronic pain: Secondary | ICD-10-CM | POA: Diagnosis present

## 2024-09-01 DIAGNOSIS — Z96612 Presence of left artificial shoulder joint: Secondary | ICD-10-CM | POA: Diagnosis present

## 2024-09-01 DIAGNOSIS — Z860100 Personal history of colon polyps, unspecified: Secondary | ICD-10-CM | POA: Diagnosis not present

## 2024-09-01 DIAGNOSIS — S3210XA Unspecified fracture of sacrum, initial encounter for closed fracture: Secondary | ICD-10-CM | POA: Diagnosis present

## 2024-09-01 DIAGNOSIS — D649 Anemia, unspecified: Secondary | ICD-10-CM | POA: Diagnosis present

## 2024-09-01 DIAGNOSIS — F32A Depression, unspecified: Secondary | ICD-10-CM | POA: Diagnosis present

## 2024-09-01 DIAGNOSIS — Z981 Arthrodesis status: Secondary | ICD-10-CM | POA: Diagnosis not present

## 2024-09-01 DIAGNOSIS — M48061 Spinal stenosis, lumbar region without neurogenic claudication: Secondary | ICD-10-CM | POA: Diagnosis present

## 2024-09-01 DIAGNOSIS — I10 Essential (primary) hypertension: Secondary | ICD-10-CM | POA: Diagnosis present

## 2024-09-01 DIAGNOSIS — M5416 Radiculopathy, lumbar region: Secondary | ICD-10-CM | POA: Diagnosis present

## 2024-09-01 DIAGNOSIS — S22089A Unspecified fracture of T11-T12 vertebra, initial encounter for closed fracture: Secondary | ICD-10-CM | POA: Diagnosis present

## 2024-09-01 DIAGNOSIS — M1990 Unspecified osteoarthritis, unspecified site: Secondary | ICD-10-CM | POA: Diagnosis present

## 2024-09-01 DIAGNOSIS — W010XXA Fall on same level from slipping, tripping and stumbling without subsequent striking against object, initial encounter: Secondary | ICD-10-CM | POA: Diagnosis present

## 2024-09-01 DIAGNOSIS — E871 Hypo-osmolality and hyponatremia: Secondary | ICD-10-CM | POA: Diagnosis present

## 2024-09-01 DIAGNOSIS — E8721 Acute metabolic acidosis: Secondary | ICD-10-CM | POA: Diagnosis present

## 2024-09-01 DIAGNOSIS — S72009A Fracture of unspecified part of neck of unspecified femur, initial encounter for closed fracture: Secondary | ICD-10-CM | POA: Diagnosis not present

## 2024-09-01 DIAGNOSIS — Z79899 Other long term (current) drug therapy: Secondary | ICD-10-CM | POA: Diagnosis not present

## 2024-09-01 MED ORDER — OXYCODONE HCL 5 MG PO TABS
20.0000 mg | ORAL_TABLET | ORAL | Status: DC | PRN
  Administered 2024-09-01 – 2024-09-05 (×14): 20 mg via ORAL
  Filled 2024-09-01 (×14): qty 4

## 2024-09-01 MED ORDER — HYDROMORPHONE HCL 1 MG/ML IJ SOLN
2.0000 mg | INTRAMUSCULAR | Status: DC | PRN
  Administered 2024-09-01 – 2024-09-02 (×3): 2 mg via INTRAVENOUS
  Filled 2024-09-01 (×5): qty 2

## 2024-09-01 MED ORDER — SORBITOL 70 % SOLN
30.0000 mL | Freq: Once | Status: AC
  Administered 2024-09-01: 30 mL via ORAL
  Filled 2024-09-01: qty 30

## 2024-09-01 NOTE — Progress Notes (Signed)
 Physical Therapy Treatment Patient Details Name: Janet Boyer MRN: [id number] DOB: [birth year] Today's Date: 09/01/2024   History of Present Illness 83 y.o. female presented with a mechanical fall at home roughly a week ago prior to presentation. on admission pt with worsening lower back pain; imaging revealed T12 compression fracture with about 60 to 70% height loss with mild L2 and L3 spinal stenosis and acute sacral fracture. ED provider spoke to on-call neurosurgery and orthopedics who recommended conservative management and no surgical intervention.   PMH: hypertension, hyperlipidemia, trigeminal neuralgia, depression, anxiety, IM nail 12/15/23, hyponatremia.    PT Comments  Pt admitted with above diagnosis.  Pt progressing slowly d/t pain and anxiety; continues to require +2 assist for basic functional tasks but decr assist required overall this session; pt able to take a few small steps forward with RW and mod +2 assist, activity tolerance slowly improving.   Would likely benefit from donut pillow to decr pain in sitting. RN to contact MD.  D/c plan remains appropriate.  Pt currently with functional limitations due to the deficits listed below (see PT Problem List). Pt will benefit from acute skilled PT to increase their independence and safety with mobility to allow discharge.       If plan is discharge home, recommend the following: Two people to help with walking and/or transfers;Two people to help with bathing/dressing/bathroom   Can travel by private vehicle     No  Equipment Recommendations  None recommended by PT    Recommendations for Other Services       Precautions / Restrictions Precautions Precautions: Fall Recall of Precautions/Restrictions: Impaired Required Braces or Orthoses: Spinal Brace Spinal Brace: Thoracolumbosacral orthotic (ordered 09/01/24) Restrictions Weight Bearing Restrictions Per Provider Order: No Other Position/Activity Restrictions: WBAT  bil LEs     Mobility  Bed Mobility Overal bed mobility: Needs Assistance Bed Mobility: Rolling, Sidelying to Sit Rolling: Min assist, Mod assist Sidelying to sit: Mod assist, +2 for physical assistance, +2 for safety/equipment       General bed mobility comments: assist to progress LEs off bed and elevate trunk. cues for log roll    Transfers Overall transfer level: Needs assistance Equipment used: Rolling walker (2 wheels) Transfers: Sit to/from Stand, Bed to chair/wheelchair/BSC Sit to Stand: Mod assist, +2 physical assistance, +2 safety/equipment   Step pivot transfers: Mod assist, +2 physical assistance, +2 safety/equipment       General transfer comment: multi-modal cues for sequence, hand placement, LE position. assist to rise and transition to RW, assist to balance and manage RW for stand step pivot to chair. improved pt effort but remains ltd by pain/anxiety    Ambulation/Gait               General Gait Details: unable d/t pain   Stairs             Wheelchair Mobility     Tilt Bed    Modified Rankin (Stroke Patients Only)       Balance Overall balance assessment: Needs assistance, History of Falls Sitting-balance support: Bilateral upper extremity supported, Feet supported Sitting balance-Leahy Scale: Poor Sitting balance - Comments: limited due to pain when sitting Postural control: Posterior lean Standing balance support: During functional activity, Reliant on assistive device for balance Standing balance-Leahy Scale: Zero Standing balance comment: reliant on UEs and external support  Communication Communication Communication: Impaired Factors Affecting Communication: Hearing impaired  Cognition Arousal: Alert Behavior During Therapy: Anxious   PT - Cognitive impairments: Memory                       PT - Cognition Comments: pt with STM deficits, repeats same question  repeatedly; Following commands: Impaired Following commands impaired: Only follows one step commands consistently    Cueing Cueing Techniques: Verbal cues, Tactile cues  Exercises      General Comments        Pertinent Vitals/Pain Pain Assessment Pain Assessment: Faces Faces Pain Scale: Hurts worst Pain Location: low back/sacrum Pain Descriptors / Indicators: Aching, Constant, Sore Pain Intervention(s): Limited activity within patient's tolerance, Monitored during session, Premedicated before session, Repositioned    Home Living                          Prior Function            PT Goals (current goals can now be found in the care plan section) Acute Rehab PT Goals Patient Stated Goal: rehab PT Goal Formulation: With family Time For Goal Achievement: 09/12/24 Progress towards PT goals: Progressing toward goals    Frequency    Min 3X/week      PT Plan      Co-evaluation              AM-PAC PT 6 Clicks Mobility   Outcome Measure  Help needed turning from your back to your side while in a flat bed without using bedrails?: A Lot Help needed moving from lying on your back to sitting on the side of a flat bed without using bedrails?: Total Help needed moving to and from a bed to a chair (including a wheelchair)?: Total Help needed standing up from a chair using your arms (e.g., wheelchair or bedside chair)?: Total Help needed to walk in hospital room?: Total Help needed climbing 3-5 steps with a railing? : Total 6 Click Score: 7    End of Session Equipment Utilized During Treatment: Gait belt;Other (comment) (TLSO, donned in sitting, removed in recliner per pt request) Activity Tolerance: Patient limited by pain Patient left: in chair;with call bell/phone within reach;with chair alarm set;with family/visitor present Nurse Communication: Mobility status PT Visit Diagnosis: Other abnormalities of gait and mobility (R26.89);Muscle weakness  (generalized) (M62.81);History of falling (Z91.81);Pain Pain - part of body:  (back)     Time: 8791-8761 PT Time Calculation (min) (ACUTE ONLY): 30 min  Charges:    $Therapeutic Activity: 23-37 mins PT General Charges $$ ACUTE PT VISIT: 1 Visit                     Nisreen Guise, PT  Acute Rehab Dept [facility]) [phone]  09/01/2024    [facility] 09/01/2024, 3:16 PM

## 2024-09-01 NOTE — Progress Notes (Signed)
 Orthopedic Tech Progress Note Patient Details:  Janet Boyer 1941/08/21 [id number]  Ortho Devices Type of Ortho Device: Thoracolumbar corset (TLSO) Ortho Device/Splint Location: Pt. declined to be fitted. C/o pain. Medium TLSO left in bedside closet. Husband at bedside. Ortho Device/Splint Interventions: Ordered   Post Interventions Patient Tolerated: Other (comment)  Janet Boyer 09/01/2024, 11:59 AM

## 2024-09-01 NOTE — Plan of Care (Signed)
   Problem: Safety: Goal: Ability to remain free from injury will improve Outcome: Progressing   Problem: Elimination: Goal: Will not experience complications related to urinary retention Outcome: Progressing

## 2024-09-01 NOTE — Progress Notes (Signed)
 [facility] PROGRESS NOTE    Progress Note  Janet Boyer  FMW:[id number] DOB: [birth year] DOA: 08/29/2024 PCP: Yolande Toribio MATSU, MD     Brief Narrative:   Janet Boyer is an 83 y.o. female past medical history of essential hypertension hyperlipidemia, trigeminal neuralgia presents with a mechanical fall that happened about a week prior to admission imaging showed T12 compression fracture about 50 to 70% height loss with mild L2-L3 spinal stenosis and acute sacral fracture.  Neurosurgery was curb sided by the ED recommended conservative management and no surgical intervention.    Assessment/Plan:   T12 compression fracture/acute sacral fracture secondary to mechanical fall: ED provider spoke with neurosurgery recommended conservative management and no surgical intervention. Still requiring significant amount of IV pain medication will need to increase oral narcotics. She to pain is not controlled even with the IV. Place a TLCO. PT evaluated the patient, she will need skilled nursing facility placement.  Mild hyponatremia: Resolved with IV fluids.  Hypokalemia: Repleted orally now resolved.  Acute metabolic acidosis: Resolved with IV fluids.  Hyperlipidemia/hypertension: Continue metoprolol  and statins.  Normocytic anemia: Hemoglobin stable.  History of trigeminal neuralgia: Continue carbamazepine    DVT prophylaxis: lovenox  Family Communication:none Status is: Observation The patient remains OBS appropriate and will d/c before 2 midnights.    Code Status:     Code Status Orders  (From admission, onward)           Start     Ordered   08/29/24 1454  Full code  Continuous       Question:  By:  Answer:  Consent: discussion documented in EHR   08/29/24 1456           Code Status History     Date Active Date Inactive Code Status Order ID Comments User Context   12/15/2023 0119 12/22/2023 2246 Full Code [id number]  Charlton Evalene RAMAN, MD  ED   12/30/2013 1401 01/01/2014 1635 Full Code [id number]  Joshua Alm RAMAN, MD Inpatient         IV Access:   Peripheral IV   Procedures and diagnostic studies:   No results found.    Medical Consultants:   None.   Subjective:    Janet Boyer relates her pain is unchanged from yesterday the pain medication seems not to be helping. Has not had a bowel movement.  Objective:    Vitals:   08/31/24 1235 08/31/24 1846 08/31/24 2048 09/01/24 0541  BP: (!) 158/57 (!) 160/62 (!) 122/55 (!) 134/56  Pulse: (!) 57 63 (!) 59 61  Resp: 16 16 17 18   Temp: 97.8 F (36.6 C) 97.9 F (36.6 C) 99.1 F (37.3 C) 98.2 F (36.8 C)  TempSrc: Oral  Oral Oral  SpO2: 99% 94% 94% 98%  Weight:      Height:       SpO2: 98 % O2 Flow Rate (L/min): 4 L/min FiO2 (%): 100 %   Intake/Output Summary (Last 24 hours) at 09/01/2024 1022 Last data filed at 09/01/2024 0600 Gross per 24 hour  Intake 300 ml  Output 200 ml  Net 100 ml   Filed Weights   08/29/24 2204  Weight: 65.8 kg    Exam: General exam: In no acute distress. Respiratory system: Good air movement and clear to auscultation. Cardiovascular system: S1 & S2 heard, RRR. No JVD. Gastrointestinal system: Abdomen is nondistended, soft and nontender.  Extremities: No pedal edema. Skin: No rashes, lesions or ulcers Psychiatry: Judgement and insight appear normal.  Mood & affect appropriate.  Data Reviewed:    Labs: Basic Metabolic Panel: Recent Labs  Lab 08/29/24 0656 08/29/24 1604 08/30/24 0346 08/31/24 0351  NA 132*  --  134* 134*  K 3.4*  --  3.5 3.7  CL 96*  --  100 100  CO2 21*  --  23 23  GLUCOSE 146*  --  104* 99  BUN 21  --  20 20  CREATININE 0.97  --  0.73 0.72  CALCIUM 9.0  --  8.7* 8.6*  MG  --  2.3  --  2.5*   GFR Estimated Creatinine Clearance: 52.7 mL/min (by C-G formula based on SCr of 0.72 mg/dL). Liver Function Tests: Recent Labs  Lab 08/30/24 0346  AST 20  ALT 10  ALKPHOS 85   BILITOT 0.5  PROT 6.5  ALBUMIN 3.7   No results for input(s): LIPASE, AMYLASE in the last 168 hours. No results for input(s): AMMONIA in the last 168 hours. Coagulation profile No results for input(s): INR, PROTIME in the last 168 hours. COVID-19 Labs  No results for input(s): DDIMER, FERRITIN, LDH, CRP in the last 72 hours.  Lab Results  Component Value Date   SARSCOV2NAA NEGATIVE 06/25/2020    CBC: Recent Labs  Lab 08/29/24 0656 08/30/24 0346 08/31/24 0351  WBC 8.0 6.7 6.6  NEUTROABS 6.4  --  3.9  HGB 11.9* 10.8* 10.7*  HCT 36.1 32.8* 33.4*  MCV 93.8 96.5 96.5  PLT 234 197 211   Cardiac Enzymes: No results for input(s): CKTOTAL, CKMB, CKMBINDEX, TROPONINI in the last 168 hours. BNP (last 3 results) No results for input(s): PROBNP in the last 8760 hours. CBG: No results for input(s): GLUCAP in the last 168 hours. D-Dimer: No results for input(s): DDIMER in the last 72 hours. Hgb A1c: No results for input(s): HGBA1C in the last 72 hours. Lipid Profile: No results for input(s): CHOL, HDL, LDLCALC, TRIG, CHOLHDL, LDLDIRECT in the last 72 hours. Thyroid  function studies: No results for input(s): TSH, T4TOTAL, T3FREE, THYROIDAB in the last 72 hours.  Invalid input(s): FREET3 Anemia work up: No results for input(s): VITAMINB12, FOLATE, FERRITIN, TIBC, IRON, RETICCTPCT in the last 72 hours. Sepsis Labs: Recent Labs  Lab 08/29/24 0656 08/30/24 0346 08/31/24 0351  WBC 8.0 6.7 6.6   Microbiology No results found for this or any previous visit (from the past 240 hours).   Medications:    carbamazepine   100 mg Oral QHS   enoxaparin  (LOVENOX ) injection  40 mg Subcutaneous Q24H   metoprolol  succinate  50 mg Oral Daily   polyethylene glycol  17 g Oral BID   pravastatin   40 mg Oral q1800   senna-docusate  1 tablet Oral BID   Continuous Infusions:    LOS: 0 days   Erle Odell Castor  [facility]  09/01/2024, 10:22 AM

## 2024-09-01 NOTE — TOC Progression Note (Signed)
 Transition of Care [facility]) - Progression Note    Patient Details  Name: Janet Boyer MRN: [id number] Date of Birth: 1941/03/18  Transition of Care [facility]) CM/SW Contact  Alfonse JONELLE Rex, RN Phone Number: 09/01/2024, 11:48 AM  Clinical Narrative:  Per 10/23 MD progress notes-patient still with uncontrolled pain with IV pain meds, no BM. Will not initiate SNF auth today, will continue to follow.     Expected Discharge Plan: Skilled Nursing Facility Barriers to Discharge: Continued Medical Work up               Expected Discharge Plan and Services       Living arrangements for the past 2 months: Single Family Home                                       Social Drivers of Health (SDOH) Interventions SDOH Screenings   Food Insecurity: No Food Insecurity (08/29/2024)  Housing: Low Risk  (08/29/2024)  Transportation Needs: No Transportation Needs (08/29/2024)  Utilities: Not At Risk (08/29/2024)  Social Connections: Moderately Isolated (08/29/2024)  Tobacco Use: Low Risk  (08/29/2024)    Readmission Risk Interventions    12/22/2023   11:30 AM  Readmission Risk Prevention Plan  Post Dischage Appt Complete  Medication Screening Complete  Transportation Screening Complete

## 2024-09-02 DIAGNOSIS — R52 Pain, unspecified: Secondary | ICD-10-CM | POA: Diagnosis not present

## 2024-09-02 DIAGNOSIS — S3210XA Unspecified fracture of sacrum, initial encounter for closed fracture: Secondary | ICD-10-CM | POA: Diagnosis not present

## 2024-09-02 MED ORDER — OXYCODONE HCL ER 10 MG PO T12A
10.0000 mg | EXTENDED_RELEASE_TABLET | Freq: Two times a day (BID) | ORAL | Status: DC
  Administered 2024-09-02 – 2024-09-03 (×4): 10 mg via ORAL
  Filled 2024-09-02 (×4): qty 1

## 2024-09-02 NOTE — Progress Notes (Signed)
 [facility] PROGRESS NOTE    Progress Note  [name]  FMW:[id number] DOB: 01-23-1941 DOA: 08/29/2024 PCP: Yolande Toribio MATSU, MD     Brief Narrative:   Janet Boyer is an 83 y.o. female past medical history of essential hypertension hyperlipidemia, trigeminal neuralgia presents with a mechanical fall that happened about a week prior to admission imaging showed T12 compression fracture about 50 to 70% height loss with mild L2-L3 spinal stenosis and acute sacral fracture.  Neurosurgery was curb sided by the ED recommended conservative management and no surgical intervention. Assessment/Plan:   T12 compression fracture/acute sacral fracture secondary to mechanical fall: ED provider spoke with neurosurgery recommended conservative management and no surgical intervention.  She still requiring high doses of IV narcotics Pain not controlled at long-acting oxycodone , continue short acting Oxy for breakthrough pain.  Continue IV Dilaudid  for breakthrough. Place a TLCO. PT evaluated the patient, she will need skilled nursing facility placement.  Mild hyponatremia: Resolved with IV fluids.  Hypokalemia: Repleted orally now resolved.  Acute metabolic acidosis: Resolved with IV fluids.  Hyperlipidemia/hypertension: Continue metoprolol  and statins.  Normocytic anemia: Hemoglobin stable.  History of trigeminal neuralgia: Continue carbamazepine    DVT prophylaxis: lovenox  Family Communication:none Status is: Observation The patient remains OBS appropriate and will d/c before 2 midnights.    Code Status:     Code Status Orders  (From admission, onward)           Start     Ordered   08/29/24 1454  Full code  Continuous       Question:  By:  Answer:  Consent: discussion documented in EHR   08/29/24 1456           Code Status History     Date Active Date Inactive Code Status Order ID Comments User Context   12/15/2023 0119 12/22/2023 2246 Full  Code [id number]  Charlton Evalene RAMAN, MD ED   12/30/2013 1401 01/01/2014 1635 Full Code [id number]  Joshua Alm RAMAN, MD Inpatient         IV Access:   Peripheral IV   Procedures and diagnostic studies:   No results found.    Medical Consultants:   None.   Subjective:    [name] still complain of back pain.  Objective:    Vitals:   09/01/24 2218 09/02/24 0447 09/02/24 0804 09/02/24 0806  BP: (!) 142/54 (!) 126/57 127/62 127/62  Pulse: 67 (!) 58 61 61  Resp: 16 19    Temp: 99 F (37.2 C) 98.8 F (37.1 C)    TempSrc: Oral Oral    SpO2: 100% 99% 99%   Weight:      Height:       SpO2: 99 % O2 Flow Rate (L/min): 4 L/min FiO2 (%): 100 %   Intake/Output Summary (Last 24 hours) at 09/02/2024 1045 Last data filed at 09/01/2024 2200 Gross per 24 hour  Intake 240 ml  Output 250 ml  Net -10 ml   Filed Weights   08/29/24 2204  Weight: 65.8 kg    Exam: General exam: In no acute distress. Respiratory system: Good air movement and clear to auscultation. Cardiovascular system: S1 & S2 heard, RRR. No JVD. Gastrointestinal system: Abdomen is nondistended, soft and nontender.  Extremities: No pedal edema. Skin: No rashes, lesions or ulcers Psychiatry: Judgement and insight appear normal. Mood & affect appropriate.  Data Reviewed:    Labs: Basic Metabolic Panel: Recent Labs  Lab 08/29/24 0656 08/29/24 1604 08/30/24 0346  08/31/24 0351  NA 132*  --  134* 134*  K 3.4*  --  3.5 3.7  CL 96*  --  100 100  CO2 21*  --  23 23  GLUCOSE 146*  --  104* 99  BUN 21  --  20 20  CREATININE 0.97  --  0.73 0.72  CALCIUM 9.0  --  8.7* 8.6*  MG  --  2.3  --  2.5*   GFR Estimated Creatinine Clearance: 52.7 mL/min (by C-G formula based on SCr of 0.72 mg/dL). Liver Function Tests: Recent Labs  Lab 08/30/24 0346  AST 20  ALT 10  ALKPHOS 85  BILITOT 0.5  PROT 6.5  ALBUMIN 3.7   No results for input(s): LIPASE, AMYLASE in the last 168 hours. No  results for input(s): AMMONIA in the last 168 hours. Coagulation profile No results for input(s): INR, PROTIME in the last 168 hours. COVID-19 Labs  No results for input(s): DDIMER, FERRITIN, LDH, CRP in the last 72 hours.  Lab Results  Component Value Date   SARSCOV2NAA NEGATIVE 06/25/2020    CBC: Recent Labs  Lab 08/29/24 0656 08/30/24 0346 08/31/24 0351  WBC 8.0 6.7 6.6  NEUTROABS 6.4  --  3.9  HGB 11.9* 10.8* 10.7*  HCT 36.1 32.8* 33.4*  MCV 93.8 96.5 96.5  PLT 234 197 211   Cardiac Enzymes: No results for input(s): CKTOTAL, CKMB, CKMBINDEX, TROPONINI in the last 168 hours. BNP (last 3 results) No results for input(s): PROBNP in the last 8760 hours. CBG: No results for input(s): GLUCAP in the last 168 hours. D-Dimer: No results for input(s): DDIMER in the last 72 hours. Hgb A1c: No results for input(s): HGBA1C in the last 72 hours. Lipid Profile: No results for input(s): CHOL, HDL, LDLCALC, TRIG, CHOLHDL, LDLDIRECT in the last 72 hours. Thyroid  function studies: No results for input(s): TSH, T4TOTAL, T3FREE, THYROIDAB in the last 72 hours.  Invalid input(s): FREET3 Anemia work up: No results for input(s): VITAMINB12, FOLATE, FERRITIN, TIBC, IRON, RETICCTPCT in the last 72 hours. Sepsis Labs: Recent Labs  Lab 08/29/24 0656 08/30/24 0346 08/31/24 0351  WBC 8.0 6.7 6.6   Microbiology No results found for this or any previous visit (from the past 240 hours).   Medications:    carbamazepine   100 mg Oral QHS   enoxaparin  (LOVENOX ) injection  40 mg Subcutaneous Q24H   metoprolol  succinate  50 mg Oral Daily   pravastatin   40 mg Oral q1800   Continuous Infusions:    LOS: 1 day   Erle Odell Castor  [facility]  09/02/2024, 10:45 AM

## 2024-09-02 NOTE — Plan of Care (Signed)
  Problem: Pain Managment: Goal: General experience of comfort will improve and/or be controlled Outcome: Progressing   Problem: Safety: Goal: Ability to remain free from injury will improve Outcome: Progressing   Problem: Skin Integrity: Goal: Risk for impaired skin integrity will decrease Outcome: Progressing   Problem: Education: Goal: Knowledge of General Education information will improve Description: Including pain rating scale, medication(s)/side effects and non-pharmacologic comfort measures Outcome: Progressing

## 2024-09-02 NOTE — Plan of Care (Signed)
  Problem: Activity: Goal: Risk for activity intolerance will decrease Outcome: Progressing   Problem: Coping: Goal: Level of anxiety will decrease Outcome: Progressing   Problem: Elimination: Goal: Will not experience complications related to bowel motility Outcome: Progressing Goal: Will not experience complications related to urinary retention Outcome: Progressing   Problem: Pain Managment: Goal: General experience of comfort will improve and/or be controlled Outcome: Progressing   Problem: Safety: Goal: Ability to remain free from injury will improve Outcome: Progressing

## 2024-09-02 NOTE — TOC Progression Note (Addendum)
 Transition of Care [facility]) - Progression Note    Patient Details  Name: Janet Boyer MRN: [id number] Date of Birth: 1941/10/10  Transition of Care [facility]) CM/SW Contact  Alfonse JONELLE Rex, RN Phone Number: 09/02/2024, 11:18 AM  Clinical Narrative:   Patient not medically ready for discharge, requiring iv pain medications, mobility limited by pain. TOC will continue to follow and initiate SNF auth patient close to discharge.      Expected Discharge Plan: Skilled Nursing Facility Barriers to Discharge: Continued Medical Work up               Expected Discharge Plan and Services       Living arrangements for the past 2 months: Single Family Home                                       Social Drivers of Health (SDOH) Interventions SDOH Screenings   Food Insecurity: No Food Insecurity (08/29/2024)  Housing: Low Risk  (08/29/2024)  Transportation Needs: No Transportation Needs (08/29/2024)  Utilities: Not At Risk (08/29/2024)  Social Connections: Moderately Isolated (08/29/2024)  Tobacco Use: Low Risk  (08/29/2024)    Readmission Risk Interventions    12/22/2023   11:30 AM  Readmission Risk Prevention Plan  Post Dischage Appt Complete  Medication Screening Complete  Transportation Screening Complete

## 2024-09-03 DIAGNOSIS — S72009A Fracture of unspecified part of neck of unspecified femur, initial encounter for closed fracture: Secondary | ICD-10-CM | POA: Diagnosis not present

## 2024-09-03 MED ORDER — POLYETHYLENE GLYCOL 3350 17 G PO PACK
17.0000 g | PACK | Freq: Two times a day (BID) | ORAL | Status: AC
Start: 2024-09-03 — End: 2024-09-05
  Administered 2024-09-03 – 2024-09-04 (×2): 17 g via ORAL
  Filled 2024-09-03 (×3): qty 1

## 2024-09-03 MED ORDER — MUSCLE RUB 10-15 % EX CREA
TOPICAL_CREAM | CUTANEOUS | Status: DC | PRN
  Administered 2024-09-04 (×3): 1 via TOPICAL
  Filled 2024-09-03: qty 85

## 2024-09-03 NOTE — Progress Notes (Signed)
 [facility] PROGRESS NOTE    Progress Note  Janet Boyer  FMW:[id number] DOB: [birth year] DOA: 08/29/2024 PCP: Yolande Toribio MATSU, MD     Brief Narrative:   Janet Boyer is an 83 y.o. female past medical history of essential hypertension hyperlipidemia, trigeminal neuralgia presents with a mechanical fall that happened about a week prior to admission imaging showed T12 compression fracture about 50 to 70% height loss with mild L2-L3 spinal stenosis and acute sacral fracture.  Neurosurgery was curb sided by the ED recommended conservative management and no surgical intervention. Assessment/Plan:   T12 compression fracture/acute sacral fracture secondary to mechanical fall: ED provider spoke with neurosurgery recommended conservative management and no surgical intervention.   Pain is now controlled on long-acting oxycodone  per short acting. She required no IV narcotics overnight. PT evaluated the patient, she will need skilled nursing facility placement.  Mild hyponatremia: Resolved with IV fluids.  Hypokalemia: Repleted orally now resolved.  Acute metabolic acidosis: Resolved with IV fluids.  Hyperlipidemia/hypertension: Continue metoprolol  and statins.  Normocytic anemia: Hemoglobin stable.  History of trigeminal neuralgia: Continue carbamazepine    DVT prophylaxis: lovenox  Family Communication:none Status is: Observation The patient remains OBS appropriate and will d/c before 2 midnights.    Code Status:     Code Status Orders  (From admission, onward)           Start     Ordered   08/29/24 1454  Full code  Continuous       Question:  By:  Answer:  Consent: discussion documented in EHR   08/29/24 1456           Code Status History     Date Active Date Inactive Code Status Order ID Comments User Context   12/15/2023 0119 12/22/2023 2246 Full Code [id number]  Charlton Evalene RAMAN, MD ED   12/30/2013 1401 01/01/2014 1635 Full Code [id number]   Joshua Alm RAMAN, MD Inpatient         IV Access:   Peripheral IV   Procedures and diagnostic studies:   No results found.    Medical Consultants:   None.   Subjective:    Janet Boyer pain is controlled slept overnight.  Objective:    Vitals:   09/02/24 0806 09/02/24 1416 09/02/24 2116 09/03/24 0617  BP: 127/62 (!) 121/51 (!) 169/74 135/67  Pulse: 61 61 76 73  Resp:  18 17 19   Temp:  98.4 F (36.9 C) 99.4 F (37.4 C) 98.6 F (37 C)  TempSrc:  Oral Oral Oral  SpO2:  98% 97% 99%  Weight:      Height:       SpO2: 99 % O2 Flow Rate (L/min): 4 L/min FiO2 (%): 100 %   Intake/Output Summary (Last 24 hours) at 09/03/2024 0924 Last data filed at 09/03/2024 0600 Gross per 24 hour  Intake 120 ml  Output 1125 ml  Net -1005 ml   Filed Weights   08/29/24 2204  Weight: 65.8 kg    Exam: General exam: In no acute distress. Respiratory system: Good air movement and clear to auscultation. Cardiovascular system: S1 & S2 heard, RRR. No JVD. Gastrointestinal system: Abdomen is nondistended, soft and nontender.  Extremities: No pedal edema. Skin: No rashes, lesions or ulcers Psychiatry: Judgement and insight appear normal. Mood & affect appropriate.  Data Reviewed:    Labs: Basic Metabolic Panel: Recent Labs  Lab 08/29/24 0656 08/29/24 1604 08/30/24 0346 08/31/24 0351  NA 132*  --  134* 134*  K 3.4*  --  3.5 3.7  CL 96*  --  100 100  CO2 21*  --  23 23  GLUCOSE 146*  --  104* 99  BUN 21  --  20 20  CREATININE 0.97  --  0.73 0.72  CALCIUM 9.0  --  8.7* 8.6*  MG  --  2.3  --  2.5*   GFR Estimated Creatinine Clearance: 52.7 mL/min (by C-G formula based on SCr of 0.72 mg/dL). Liver Function Tests: Recent Labs  Lab 08/30/24 0346  AST 20  ALT 10  ALKPHOS 85  BILITOT 0.5  PROT 6.5  ALBUMIN 3.7   No results for input(s): LIPASE, AMYLASE in the last 168 hours. No results for input(s): AMMONIA in the last 168 hours. Coagulation  profile No results for input(s): INR, PROTIME in the last 168 hours. COVID-19 Labs  No results for input(s): DDIMER, FERRITIN, LDH, CRP in the last 72 hours.  Lab Results  Component Value Date   SARSCOV2NAA NEGATIVE 06/25/2020    CBC: Recent Labs  Lab 08/29/24 0656 08/30/24 0346 08/31/24 0351  WBC 8.0 6.7 6.6  NEUTROABS 6.4  --  3.9  HGB 11.9* 10.8* 10.7*  HCT 36.1 32.8* 33.4*  MCV 93.8 96.5 96.5  PLT 234 197 211   Cardiac Enzymes: No results for input(s): CKTOTAL, CKMB, CKMBINDEX, TROPONINI in the last 168 hours. BNP (last 3 results) No results for input(s): PROBNP in the last 8760 hours. CBG: No results for input(s): GLUCAP in the last 168 hours. D-Dimer: No results for input(s): DDIMER in the last 72 hours. Hgb A1c: No results for input(s): HGBA1C in the last 72 hours. Lipid Profile: No results for input(s): CHOL, HDL, LDLCALC, TRIG, CHOLHDL, LDLDIRECT in the last 72 hours. Thyroid  function studies: No results for input(s): TSH, T4TOTAL, T3FREE, THYROIDAB in the last 72 hours.  Invalid input(s): FREET3 Anemia work up: No results for input(s): VITAMINB12, FOLATE, FERRITIN, TIBC, IRON, RETICCTPCT in the last 72 hours. Sepsis Labs: Recent Labs  Lab 08/29/24 0656 08/30/24 0346 08/31/24 0351  WBC 8.0 6.7 6.6   Microbiology No results found for this or any previous visit (from the past 240 hours).   Medications:    carbamazepine   100 mg Oral QHS   enoxaparin  (LOVENOX ) injection  40 mg Subcutaneous Q24H   metoprolol  succinate  50 mg Oral Daily   oxyCODONE   10 mg Oral Q12H   pravastatin   40 mg Oral q1800   Continuous Infusions:    LOS: 2 days   Erle Odell Castor  [facility]  09/03/2024, 9:24 AM

## 2024-09-03 NOTE — Plan of Care (Signed)
   Problem: Coping: Goal: Level of anxiety will decrease Outcome: Progressing   Problem: Pain Managment: Goal: General experience of comfort will improve and/or be controlled Outcome: Progressing   Problem: Safety: Goal: Ability to remain free from injury will improve Outcome: Progressing

## 2024-09-03 NOTE — Progress Notes (Signed)
 Patient has been sleeping well since evening dose of long acting oxycontin  was given, has not required additional oxycodone  or iv dilaudid .

## 2024-09-04 DIAGNOSIS — S72009A Fracture of unspecified part of neck of unspecified femur, initial encounter for closed fracture: Secondary | ICD-10-CM | POA: Diagnosis not present

## 2024-09-04 MED ORDER — OXYCODONE HCL ER 20 MG PO T12A: 20.0000 mg | EXTENDED_RELEASE_TABLET | Freq: Two times a day (BID) | ORAL | Status: DC

## 2024-09-04 MED ORDER — OXYCODONE HCL ER 10 MG PO T12A
10.0000 mg | EXTENDED_RELEASE_TABLET | Freq: Two times a day (BID) | ORAL | Status: DC
Start: 2024-09-04 — End: 2024-09-05
  Administered 2024-09-04 – 2024-09-05 (×3): 10 mg via ORAL
  Filled 2024-09-04 (×3): qty 1

## 2024-09-04 NOTE — Plan of Care (Signed)
   Problem: Coping: Goal: Level of anxiety will decrease Outcome: Progressing   Problem: Pain Managment: Goal: General experience of comfort will improve and/or be controlled Outcome: Progressing

## 2024-09-04 NOTE — Progress Notes (Signed)
 Patient sleeping soundly at this time, husband also asleep on the couch.

## 2024-09-04 NOTE — Progress Notes (Signed)
 [facility] PROGRESS NOTE    Progress Note  [name]  FMW:[id number] DOB: [birth year] DOA: 08/29/2024 PCP: Yolande Toribio MATSU, MD     Brief Narrative:   Janet Boyer is an 83 y.o. female past medical history of essential hypertension hyperlipidemia, trigeminal neuralgia presents with a mechanical fall that happened about a week prior to admission imaging showed T12 compression fracture about 50 to 70% height loss with mild L2-L3 spinal stenosis and acute sacral fracture.  Neurosurgery was curb sided by the ED recommended conservative management and no surgical intervention. Assessment/Plan:  T12 compression fracture/acute sacral fracture secondary to mechanical fall: ED provider spoke with neurosurgery recommended conservative management and no surgical intervention.   Pain is now controlled on long-acting oxycodone  per short acting. Patient has unrealistic expectation about pain control.  DC IV Dilaudid  PT evaluated the patient, she will need skilled nursing facility placement.  Mild hyponatremia: Resolved with IV fluids.  Hypokalemia: Repleted orally now resolved.  Acute metabolic acidosis: Resolved with IV fluids.  Hyperlipidemia/hypertension: Continue metoprolol  and statins.  Normocytic anemia: Hemoglobin stable.  History of trigeminal neuralgia: Continue carbamazepine    DVT prophylaxis: lovenox  Family Communication:none Status is: Observation The patient remains OBS appropriate and will d/c before 2 midnights.    Code Status:     Code Status Orders  (From admission, onward)           Start     Ordered   08/29/24 1454  Full code  Continuous       Question:  By:  Answer:  Consent: discussion documented in EHR   08/29/24 1456           Code Status History     Date Active Date Inactive Code Status Order ID Comments User Context   12/15/2023 0119 12/22/2023 2246 Full Code [id number]  Charlton Evalene RAMAN, MD ED   12/30/2013 1401  01/01/2014 1635 Full Code [id number]  Joshua Alm RAMAN, MD Inpatient         IV Access:   Peripheral IV   Procedures and diagnostic studies:   No results found.    Medical Consultants:   None.   Subjective:    Janet Boyer was sleeping this morning as soon as I woke her up she relates she was in pain 10 out of 10  Objective:    Vitals:   09/03/24 0617 09/03/24 1436 09/03/24 2051 09/04/24 0617  BP: 135/67 139/61 (!) 133/54 (!) 119/51  Pulse: 73 66 72 (!) 59  Resp: 19 17 16 16   Temp: 98.6 F (37 C) 98.1 F (36.7 C) 98 F (36.7 C) 97.9 F (36.6 C)  TempSrc: Oral Oral Oral   SpO2: 99% 98% 99% 98%  Weight:      Height:       SpO2: 98 % O2 Flow Rate (L/min): 4 L/min FiO2 (%): 100 %   Intake/Output Summary (Last 24 hours) at 09/04/2024 0732 Last data filed at 09/04/2024 0500 Gross per 24 hour  Intake 120 ml  Output 500 ml  Net -380 ml   Filed Weights   08/29/24 2204  Weight: 65.8 kg    Exam: General exam: In no acute distress. Respiratory system: Good air movement and clear to auscultation. Cardiovascular system: S1 & S2 heard, RRR. No JVD. Gastrointestinal system: Abdomen is nondistended, soft and nontender.  Extremities: No pedal edema. Skin: No rashes, lesions or ulcers Psychiatry: Judgement and insight appear normal. Mood & affect appropriate.  Data Reviewed:    Labs:  Basic Metabolic Panel: Recent Labs  Lab 08/29/24 0656 08/29/24 1604 08/30/24 0346 08/31/24 0351  NA 132*  --  134* 134*  K 3.4*  --  3.5 3.7  CL 96*  --  100 100  CO2 21*  --  23 23  GLUCOSE 146*  --  104* 99  BUN 21  --  20 20  CREATININE 0.97  --  0.73 0.72  CALCIUM 9.0  --  8.7* 8.6*  MG  --  2.3  --  2.5*   GFR Estimated Creatinine Clearance: 52.7 mL/min (by C-G formula based on SCr of 0.72 mg/dL). Liver Function Tests: Recent Labs  Lab 08/30/24 0346  AST 20  ALT 10  ALKPHOS 85  BILITOT 0.5  PROT 6.5  ALBUMIN 3.7   No results for input(s):  LIPASE, AMYLASE in the last 168 hours. No results for input(s): AMMONIA in the last 168 hours. Coagulation profile No results for input(s): INR, PROTIME in the last 168 hours. COVID-19 Labs  No results for input(s): DDIMER, FERRITIN, LDH, CRP in the last 72 hours.  Lab Results  Component Value Date   SARSCOV2NAA NEGATIVE 06/25/2020    CBC: Recent Labs  Lab 08/29/24 0656 08/30/24 0346 08/31/24 0351  WBC 8.0 6.7 6.6  NEUTROABS 6.4  --  3.9  HGB 11.9* 10.8* 10.7*  HCT 36.1 32.8* 33.4*  MCV 93.8 96.5 96.5  PLT 234 197 211   Cardiac Enzymes: No results for input(s): CKTOTAL, CKMB, CKMBINDEX, TROPONINI in the last 168 hours. BNP (last 3 results) No results for input(s): PROBNP in the last 8760 hours. CBG: No results for input(s): GLUCAP in the last 168 hours. D-Dimer: No results for input(s): DDIMER in the last 72 hours. Hgb A1c: No results for input(s): HGBA1C in the last 72 hours. Lipid Profile: No results for input(s): CHOL, HDL, LDLCALC, TRIG, CHOLHDL, LDLDIRECT in the last 72 hours. Thyroid  function studies: No results for input(s): TSH, T4TOTAL, T3FREE, THYROIDAB in the last 72 hours.  Invalid input(s): FREET3 Anemia work up: No results for input(s): VITAMINB12, FOLATE, FERRITIN, TIBC, IRON, RETICCTPCT in the last 72 hours. Sepsis Labs: Recent Labs  Lab 08/29/24 0656 08/30/24 0346 08/31/24 0351  WBC 8.0 6.7 6.6   Microbiology No results found for this or any previous visit (from the past 240 hours).   Medications:    carbamazepine   100 mg Oral QHS   enoxaparin  (LOVENOX ) injection  40 mg Subcutaneous Q24H   metoprolol  succinate  50 mg Oral Daily   oxyCODONE   10 mg Oral Q12H   polyethylene glycol  17 g Oral BID   pravastatin   40 mg Oral q1800   Continuous Infusions:    LOS: 3 days   Erle Odell Castor  [facility]  09/04/2024, 7:32 AM

## 2024-09-05 DIAGNOSIS — R2689 Other abnormalities of gait and mobility: Secondary | ICD-10-CM | POA: Diagnosis not present

## 2024-09-05 DIAGNOSIS — D649 Anemia, unspecified: Secondary | ICD-10-CM | POA: Diagnosis not present

## 2024-09-05 DIAGNOSIS — E871 Hypo-osmolality and hyponatremia: Secondary | ICD-10-CM | POA: Diagnosis not present

## 2024-09-05 DIAGNOSIS — F32A Depression, unspecified: Secondary | ICD-10-CM | POA: Diagnosis not present

## 2024-09-05 DIAGNOSIS — S3210XD Unspecified fracture of sacrum, subsequent encounter for fracture with routine healing: Secondary | ICD-10-CM | POA: Diagnosis not present

## 2024-09-05 DIAGNOSIS — G500 Trigeminal neuralgia: Secondary | ICD-10-CM | POA: Diagnosis not present

## 2024-09-05 DIAGNOSIS — Z7189 Other specified counseling: Secondary | ICD-10-CM | POA: Diagnosis not present

## 2024-09-05 DIAGNOSIS — M6281 Muscle weakness (generalized): Secondary | ICD-10-CM | POA: Diagnosis not present

## 2024-09-05 DIAGNOSIS — R52 Pain, unspecified: Secondary | ICD-10-CM | POA: Diagnosis not present

## 2024-09-05 DIAGNOSIS — I10 Essential (primary) hypertension: Secondary | ICD-10-CM | POA: Diagnosis not present

## 2024-09-05 DIAGNOSIS — M5417 Radiculopathy, lumbosacral region: Secondary | ICD-10-CM | POA: Diagnosis not present

## 2024-09-05 DIAGNOSIS — E785 Hyperlipidemia, unspecified: Secondary | ICD-10-CM | POA: Diagnosis not present

## 2024-09-05 DIAGNOSIS — Z9181 History of falling: Secondary | ICD-10-CM | POA: Diagnosis not present

## 2024-09-05 DIAGNOSIS — S3210XA Unspecified fracture of sacrum, initial encounter for closed fracture: Secondary | ICD-10-CM | POA: Diagnosis not present

## 2024-09-05 DIAGNOSIS — M48061 Spinal stenosis, lumbar region without neurogenic claudication: Secondary | ICD-10-CM | POA: Diagnosis not present

## 2024-09-05 DIAGNOSIS — M4987 Spondylopathy in diseases classified elsewhere, lumbosacral region: Secondary | ICD-10-CM | POA: Diagnosis not present

## 2024-09-05 DIAGNOSIS — S72009A Fracture of unspecified part of neck of unspecified femur, initial encounter for closed fracture: Secondary | ICD-10-CM | POA: Diagnosis not present

## 2024-09-05 DIAGNOSIS — R41841 Cognitive communication deficit: Secondary | ICD-10-CM | POA: Diagnosis not present

## 2024-09-05 DIAGNOSIS — S22080D Wedge compression fracture of T11-T12 vertebra, subsequent encounter for fracture with routine healing: Secondary | ICD-10-CM | POA: Diagnosis not present

## 2024-09-05 MED ORDER — SORBITOL 70 % SOLN
30.0000 mL | Freq: Once | Status: AC
  Administered 2024-09-05: 30 mL via ORAL
  Filled 2024-09-05: qty 30

## 2024-09-05 MED ORDER — POLYETHYLENE GLYCOL 3350 17 G PO PACK: 17.0000 g | PACK | Freq: Every day | ORAL | Status: AC

## 2024-09-05 MED ORDER — OXYCODONE HCL ER 10 MG PO T12A: 10.0000 mg | EXTENDED_RELEASE_TABLET | Freq: Two times a day (BID) | ORAL | 0 refills | Status: AC

## 2024-09-05 MED ORDER — POLYETHYLENE GLYCOL 3350 17 G PO PACK
17.0000 g | PACK | Freq: Two times a day (BID) | ORAL | Status: DC
  Administered 2024-09-05: 17 g via ORAL
  Filled 2024-09-05: qty 1

## 2024-09-05 MED ORDER — ENOXAPARIN SODIUM 40 MG/0.4ML IJ SOSY: 40.0000 mg | PREFILLED_SYRINGE | INTRAMUSCULAR | Status: DC

## 2024-09-05 MED ORDER — OXYCODONE HCL 20 MG PO TABS: 20.0000 mg | ORAL_TABLET | ORAL | 0 refills | Status: AC | PRN

## 2024-09-05 NOTE — Progress Notes (Signed)
 Physical Therapy Treatment Patient Details Name: Janet Boyer MRN: [id number] DOB: 1941/03/20 Today's Date: 09/05/2024   History of Present Illness 83 y.o. female presented with a mechanical fall at home roughly a week ago prior to presentation. on admission pt with worsening lower back pain; imaging revealed T12 compression fracture with about 60 to 70% height loss with mild L2 and L3 spinal stenosis and acute sacral fracture. ED provider spoke to on-call neurosurgery and orthopedics who recommended conservative management and no surgical intervention.   PMH: hypertension, hyperlipidemia, trigeminal neuralgia, depression, anxiety, IM nail 12/15/23, hyponatremia.    PT Comments  Pt making small gains today; continues to scream with pain despite being premedicated, pt responds to breathing and relaxation techniques during session.  Pt able to perform STS x3 with decr assist needed with 3rd trial. Amb~ 6' with RW and +2 mod assist, encouraged OOB as much as possible. D/c plan remains appropriate  at this time. Continue to follow acutely. RN checked with supply and they stated we do not have donut pillows anymore.    If plan is discharge home, recommend the following: Two people to help with walking and/or transfers;Two people to help with bathing/dressing/bathroom   Can travel by private vehicle     No  Equipment Recommendations  None recommended by PT    Recommendations for Other Services       Precautions / Restrictions Precautions Precautions: Fall Recall of Precautions/Restrictions: Impaired Required Braces or Orthoses: Spinal Brace Spinal Brace: Thoracolumbosacral orthotic Restrictions Weight Bearing Restrictions Per Provider Order: No Other Position/Activity Restrictions: WBAT bil LEs     Mobility  Bed Mobility Overal bed mobility: Needs Assistance Bed Mobility: Rolling, Sidelying to Sit Rolling: Min assist, Mod assist, +2 for safety/equipment, +2 for physical  assistance         General bed mobility comments: assist to progress LEs off bed and elevate trunk. cues for log roll    Transfers Overall transfer level: Needs assistance Equipment used: Rolling walker (2 wheels), 2 person hand held assist Transfers: Sit to/from Stand Sit to Stand: Mod assist, +2 physical assistance, +2 safety/equipment           General transfer comment: STS x3; multi-modal cues for sequence, hand placement, LE position. assist to rise and steady, once in standing requires cues to incr BOS and assist to balance.  improved pt effort but remains ltd by pain/anxiety    Ambulation/Gait Ambulation/Gait assistance: Mod assist, +2 physical assistance, +2 safety/equipment Gait Distance (Feet): 6 Feet Assistive device: Rolling walker (2 wheels) Gait Pattern/deviations: Step-to pattern, Narrow base of support       General Gait Details: constant cues for trunk extension, step length, progression of fwd motion;   Stairs             Wheelchair Mobility     Tilt Bed    Modified Rankin (Stroke Patients Only)       Balance Overall balance assessment: Needs assistance, History of Falls   Sitting balance-Leahy Scale: Poor Sitting balance - Comments: limited due to pain when sitting Postural control: Posterior lean Standing balance support: During functional activity, Reliant on assistive device for balance Standing balance-Leahy Scale: Zero Standing balance comment: reliant on UEs and external support                            Communication Communication Communication: Impaired Factors Affecting Communication: Hearing impaired  Cognition Arousal: Alert Behavior During Therapy: Anxious  PT - Cognitive impairments: Memory, Problem solving, Initiation, Attention                       PT - Cognition Comments: pt with STM deficits, delayed processing, requires multi-modal cues for all basic mobility tasks Following commands:  Impaired Following commands impaired: Follows one step commands inconsistently    Cueing Cueing Techniques: Verbal cues, Tactile cues  Exercises      General Comments General comments (skin integrity, edema, etc.): spouse present, supportive-encouraging pt to mobilize'      Pertinent Vitals/Pain Pain Assessment Pain Assessment: Faces Faces Pain Scale: Hurts worst Pain Location: low back/sacrum Pain Descriptors / Indicators: Grimacing, Guarding (screaming) Pain Intervention(s): Limited activity within patient's tolerance, Monitored during session, Premedicated before session, Repositioned    Home Living                          Prior Function            PT Goals (current goals can now be found in the care plan section) Acute Rehab PT Goals Patient Stated Goal: rehab PT Goal Formulation: With family Time For Goal Achievement: 09/12/24 Progress towards PT goals: Progressing toward goals    Frequency    Min 3X/week      PT Plan      Co-evaluation              AM-PAC PT 6 Clicks Mobility   Outcome Measure  Help needed turning from your back to your side while in a flat bed without using bedrails?: A Lot Help needed moving from lying on your back to sitting on the side of a flat bed without using bedrails?: Total Help needed moving to and from a bed to a chair (including a wheelchair)?: Total Help needed standing up from a chair using your arms (e.g., wheelchair or bedside chair)?: Total Help needed to walk in hospital room?: Total Help needed climbing 3-5 steps with a railing? : Total 6 Click Score: 7    End of Session Equipment Utilized During Treatment: Gait belt;Other (comment) (TLSO) Activity Tolerance: Patient limited by pain;Patient tolerated treatment well Patient left: in chair;with call bell/phone within reach;with chair alarm set;with family/visitor present Nurse Communication: Mobility status PT Visit Diagnosis: Other abnormalities  of gait and mobility (R26.89);Muscle weakness (generalized) (M62.81);History of falling (Z91.81);Pain Pain - part of body:  (sacrum)     Time: 9050-8984 PT Time Calculation (min) (ACUTE ONLY): 26 min  Charges:    $Gait Training: 8-22 mins $Therapeutic Activity: 8-22 mins PT General Charges $$ ACUTE PT VISIT: 1 Visit                     Erven Ramson, PT  Acute Rehab Dept [facility]) [phone]  09/05/2024    [facility] 09/05/2024, 10:51 AM

## 2024-09-05 NOTE — Plan of Care (Signed)
  Problem: Education: Goal: Knowledge of General Education information will improve Description: Including pain rating scale, medication(s)/side effects and non-pharmacologic comfort measures Outcome: Progressing   Problem: Health Behavior/Discharge Planning: Goal: Ability to manage health-related needs will improve Outcome: Progressing   Problem: Clinical Measurements: Goal: Ability to maintain clinical measurements within normal limits will improve Outcome: Progressing   Problem: Activity: Goal: Risk for activity intolerance will decrease Outcome: Progressing   Problem: Nutrition: Goal: Adequate nutrition will be maintained Outcome: Progressing   Problem: Coping: Goal: Level of anxiety will decrease Outcome: Progressing   Problem: Pain Managment: Goal: General experience of comfort will improve and/or be controlled Outcome: Progressing   Problem: Safety: Goal: Ability to remain free from injury will improve Outcome: Progressing   Problem: Skin Integrity: Goal: Risk for impaired skin integrity will decrease Outcome: Progressing

## 2024-09-05 NOTE — TOC Progression Note (Addendum)
 Transition of Care [facility]) - Progression Note    Patient Details  Name: Janet Boyer MRN: [id number] Date of Birth: 20-Feb-1941  [facility]) CM/SW Contact  Alfonse JONELLE Rex, RN Phone Number: 09/05/2024, 1:05 PM  Clinical Narrative:   DC order in place. Updated PT notes in place, SNF auth initiated via [place]. Auth# [id number], auth pending.   -1:29pm per [facility] SNF auth approved. Plan Auth ID -[id number], Shara BALBOA [id number] Days approved, 09/05/2024-09/07/2024     Expected Discharge Plan: Skilled Nursing Facility Barriers to Discharge: Continued Medical Work up               Expected Discharge Plan and Services       Living arrangements for the past 2 months: Single Family Home Expected Discharge Date: 09/05/24                                     Social Drivers of Health (SDOH) Interventions SDOH Screenings   Food Insecurity: No Food Insecurity (08/29/2024)  Housing: Low Risk  (08/29/2024)  Transportation Needs: No Transportation Needs (08/29/2024)  Utilities: Not At Risk (08/29/2024)  Social Connections: Moderately Isolated (08/29/2024)  Tobacco Use: Low Risk  (08/29/2024)    Readmission Risk Interventions    12/22/2023   11:30 AM  Readmission Risk Prevention Plan  Post Dischage Appt Complete  Medication Screening Complete  Transportation Screening Complete

## 2024-09-05 NOTE — Progress Notes (Signed)
 Called facility and gave report to [facility], all questions answered. Pt is resting in bed, alert and oriented x 4. Pt is on room air, respirations are even and unlabored, not in any distress. Pt is to discharge to SNF with all of her belongings. Husband is at bedside, updated.

## 2024-09-05 NOTE — TOC Transition Note (Signed)
 Transition of Care [facility]) - Discharge Note   Patient Details  Name: Janet Boyer MRN: [id number] Date of Birth: Dec 06, 1940  Transition of Care [facility]) CM/SW Contact:  Alfonse JONELLE Rex, RN Phone Number: 09/05/2024, 4:14 PM   Clinical Narrative:   DC to SNF-[facility]. RM 407p, patient's husband at bedside, PTAR for transport. No further TOC needs at this time.     Final next level of care: Skilled Nursing Facility Barriers to Discharge: Barriers Resolved   Patient Goals and CMS Choice Patient states their goals for this hospitalization and ongoing recovery are:: return home CMS Medicare.gov Compare Post Acute Care list provided to:: Patient Choice offered to / list presented to : Patient [place] ownership interest in [facility].provided to:: Patient    Discharge Placement              Patient chooses bed at: [facility] Patient to be transferred to facility by: PTAR Name of family member notified: pt's husband at bedside Patient and family notified of of transfer: 09/05/24  Discharge Plan and Services Additional resources added to the After Visit Summary for                                       Social Drivers of Health (SDOH) Interventions SDOH Screenings   Food Insecurity: No Food Insecurity (08/29/2024)  Housing: Low Risk  (08/29/2024)  Transportation Needs: No Transportation Needs (08/29/2024)  Utilities: Not At Risk (08/29/2024)  Social Connections: Moderately Isolated (08/29/2024)  Tobacco Use: Low Risk  (08/29/2024)     Readmission Risk Interventions    09/05/2024    4:13 PM 12/22/2023   11:30 AM  Readmission Risk Prevention Plan  Post Dischage Appt Complete Complete  Medication Screening Complete Complete  Transportation Screening Complete Complete

## 2024-09-05 NOTE — Plan of Care (Signed)
   Problem: Coping: Goal: Level of anxiety will decrease Outcome: Progressing   Problem: Pain Managment: Goal: General experience of comfort will improve and/or be controlled Outcome: Progressing   Problem: Safety: Goal: Ability to remain free from injury will improve Outcome: Progressing

## 2024-09-05 NOTE — Discharge Summary (Signed)
 Physician Discharge Summary  Janet Boyer FMW:[id number] DOB: 09-Mar-1941 DOA: 08/29/2024  PCP: Yolande Toribio MATSU, MD  Admit date: 08/29/2024 Discharge date: 09/05/2024  Admitted From: Home Disposition:  SNF  Recommendations for Outpatient Follow-up:  Follow up with PCP in 1-2 weeks Please obtain BMP/CBC in one week   Home Health:No Equipment/Devices:None  Discharge Condition:Stable CODE STATUS:Full Diet recommendation: Heart Healthy  Brief/Interim Summary: 83 y.o. female past medical history of essential hypertension hyperlipidemia, trigeminal neuralgia presents with a mechanical fall that happened about a week prior to admission imaging showed T12 compression fracture about 50 to 70% height loss with mild L2-L3 spinal stenosis and acute sacral fracture.  Neurosurgery was curb sided by the ED recommended conservative management and no surgical intervention.   Discharge Diagnoses:  Principal Problem:   Hip fracture (HCC) Active Problems:   Sacral fracture (HCC)   Hypertension   Trigeminal neuralgia   HLD (hyperlipidemia)  T12 compression fraction/sacral fracture secondary to mechanical fall: ED provider spoke with neurosurgery recommended conservative management no surgical intervention. She was admitted for intractable pain which is very hard to control. She finally was placed on long-acting Cotton with oxycodone  for breakthrough which controlled her pain. She was started on a bowel regiment. Physical therapy evaluated the patient, she will need to go to skilled nursing facility.  Mild hyponatremia: Resolved with IV fluids.  Hypokalemia, Repleted now resolved.  Acute metabolic acidosis: Resolved with IV fluids.  Hyperlipidemia/hypertension: Send changes made to her medication.  Normocytic anemia: Hemoglobin remained stable.  History of trigeminal neuralgia: Continue carbamazepine .  Discharge Instructions  Discharge Instructions     Diet - low  sodium heart healthy   Complete by: As directed    Increase activity slowly   Complete by: As directed       Allergies as of 09/05/2024       Reactions   Amoxicillin Swelling   Facial swelling. Patient denies   Other Other (See Comments)   Strawberries and tomatoes cause facial swelling   Fluoxetine Hcl Other (See Comments)   Fatigue        Medication List     TAKE these medications    acetaminophen  325 MG tablet Commonly known as: TYLENOL  Take 1-2 tablets (325-650 mg total) by mouth every 6 (six) hours as needed for mild pain (pain score 1-3) (or temp > 100.5).   carbamazepine  100 MG chewable tablet Commonly known as: TEGRETOL  Chew 50 mg by mouth See admin instructions. Take 50mg  at bedtime. May take 50-100mg  as needed for trigeminal neuralgia.   lovastatin 40 MG tablet Commonly known as: MEVACOR Take 40 mg by mouth daily.   metoprolol  succinate 50 MG 24 hr tablet Commonly known as: TOPROL -XL Take 50 mg by mouth daily. Take with or immediately following a meal.   Oxycodone  HCl 20 MG Tabs Take 1 tablet (20 mg total) by mouth every 4 (four) hours as needed for up to 5 days for moderate pain (pain score 4-6).   oxyCODONE  10 mg 12 hr tablet Commonly known as: OXYCONTIN  Take 1 tablet (10 mg total) by mouth every 12 (twelve) hours for 5 days.   polyethylene glycol 17 g packet Commonly known as: MIRALAX  / GLYCOLAX  Take 17 g by mouth daily.   triamterene -hydrochlorothiazide  37.5-25 MG tablet Commonly known as: MAXZIDE -25 Take 0.5 tablets by mouth every morning.        Contact information for after-discharge care     Destination     [facility], [place] .  Service: Skilled Nursing Contact information: [address] [place]  [id number] [phone]                    Allergies  Allergen Reactions   Amoxicillin Swelling    Facial swelling. Patient denies   Other Other (See Comments)    Strawberries and  tomatoes cause facial swelling   Fluoxetine Hcl Other (See Comments)    Fatigue    Consultations: None   Procedures/Studies: MR PELVIS WO CONTRAST Result Date: 08/29/2024 CLINICAL DATA:  Low back and pelvic pain since falling last week. EXAM: MRI PELVIS WITHOUT CONTRAST TECHNIQUE: Multiplanar multisequence MR imaging of the pelvis was performed. No intravenous contrast was administered. COMPARISON:  CT lumbar spine and pelvis 08/29/2024. Hip radiographs 12/14/2023. FINDINGS: Bones/Joint/Cartilage There are acute/subacute, mildly displaced fractures of both sacral ala, worse on the right. Associated bone marrow and surrounding soft tissue edema, asymmetric to the right. There are nondisplaced left parasymphyseal fractures with associated marrow edema. No other pelvic fractures are identified, and the sacroiliac joints and symphysis pubis are intact. There are postsurgical changes from proximal right femoral ORIF without evidence of acute osseous abnormality. The previously demonstrated right intertrochanteric femur fracture appears healed. However, there is serpiginous low T1 signal within the right femoral head, suspicious for underlying osteonecrosis. Mild associated right hip degenerative changes without significant joint effusion. No evidence of left femoral head osteonecrosis or significant joint effusion. Lumbar spine findings dictated separately. Ligaments No significant ligamentous abnormalities are identified. Muscles and Tendons Atrophy of the erector spinae musculature inferiorly and the right hip musculature. Asymmetric edema tracks along the right iliac bone and iliacus muscle, attributed to the sacral fractures. No acute tendon abnormalities are identified. Soft tissue As above, presacral edema asymmetric to the right, attributed to the sacral fractures. No evidence of hip periarticular hematoma. Mild distal colonic diverticulosis. IMPRESSION: 1. Acute/subacute, mildly displaced fractures of  both sacral ala, worse on the right. 2. Nondisplaced left parasymphyseal fractures. 3. Postsurgical changes from proximal right femoral ORIF. The previously demonstrated right intertrochanteric femur fracture appears healed. Findings suspicious for underlying right femoral head osteonecrosis. 4. Lumbar spine findings dictated separately. Electronically Signed   By: Elsie Perone M.D.   On: 08/29/2024 12:29   MR LUMBAR SPINE WO CONTRAST Result Date: 08/29/2024 CLINICAL DATA:  Low back pain since falling last week. Unable to stand without assistance today and increased pain. EXAM: MRI LUMBAR SPINE WITHOUT CONTRAST TECHNIQUE: Multiplanar, multisequence MR imaging of the lumbar spine was performed. No intravenous contrast was administered. COMPARISON:  CT lumbar spine 08/29/2024.  Lumbar MRI 11/21/2011. FINDINGS: Segmentation: Conventional anatomy assumed, with the last open disc space designated L5-S1.Concordant with prior imaging. Alignment: Minimal convex left scoliosis. Fused degenerative grade 1 anterolisthesis at L4-5. Vertebrae: No evidence of acute lumbar spine fracture or traumatic subluxation. As seen on the recent lumbar spine CT, there is a nonacute severe compression fracture at T12 which is healed, without residual marrow edema. This fracture is associated with 65% loss of vertebral body height and 4 mm of osseous retropulsion. There is an acute sacral fracture, further described on separate examination of the pelvis. There are postsurgical changes from L4-5 PLIF. Conus medullaris: Extends to the L1-2 level. The conus and cauda equina appear normal. Paraspinal and other soft tissues: No significant lumbar paraspinal findings. There is presacral edema attributed to the sacral fractures. Bilateral renal cysts for which no specific follow-up imaging is recommended. Disc levels: T11-12: Mild disc bulging with osseous retropulsion.  No cord deformity or significant foraminal narrowing. T12-L1: Mild disc  bulging. No spinal stenosis or significant foraminal narrowing. L1-2: No significant findings. L2-3: Mild disc bulging with mild facet and ligamentous hypertrophy. Resulting mild spinal stenosis with mild lateral recess narrowing bilaterally. No nerve root impingement. L3-4: Preserved disc height with mild disc bulging, facet and ligamentous hypertrophy. No significant spinal stenosis or foraminal narrowing. L4-5: Status post posterior decompression and PLIF. The spinal canal and foramina are widely patent. L5-S1: Preserved disc height. Mild bilateral facet hypertrophy. No significant spinal stenosis or foraminal narrowing. IMPRESSION: 1. Acute sacral fracture, further described on separate examination of the pelvis. 2. No acute lumbar spine fracture or traumatic subluxation. 3. Nonacute severe T12 compression fracture. 4. Mild multifactorial spinal stenosis at L2-3. 5. Postsurgical changes at L4-5 without residual spinal stenosis or nerve root impingement. Electronically Signed   By: Elsie Perone M.D.   On: 08/29/2024 12:17   CT PELVIS WO CONTRAST Result Date: 08/29/2024 EXAM: CT Pelvis, Without IV Contrast 08/29/2024 08:18:26 AM TECHNIQUE: Axial images were acquired through the pelvis without IV contrast. Reformatted images were reviewed. Automated exposure control, iterative reconstruction, and/or weight based adjustment of the mA/kV was utilized to reduce the radiation dose to as low as reasonably achievable. COMPARISON: Right hip series dated 12/14/2023. CLINICAL HISTORY: Hip trauma, fracture suspected, no prior imaging. Patient arrives EMS from home with reports of fall last week. Patient has had lower back pain/tail bone pain since the fall. Patient has been ambulatory with walker but this morning was unable to stand without assistance and has had increased pain. FINDINGS: BONES: Since the previous study, the patient has undergone open reduction and internal fixation of the right femoral head and neck.  An intramedullary screw is present within the femoral head and neck and an interlocking rod within the proximal femoral shaft. There is no apparent acute fracture. The bony pelvis is intact. JOINTS: No dislocation. The joint spaces are normal. SOFT TISSUES: The soft tissues are unremarkable. INTRAPELVIC CONTENTS: Limited images of the intrapelvic contents demonstrate numerous sigmoid diverticula. IMPRESSION: 1. No acute osseous abnormality. 2. Status post open reduction and internal fixation of the right femoral head and neck with intramedullary screw and interlocking rod within the proximal femoral shaft. Electronically signed by: Evalene Coho MD 08/29/2024 08:36 AM EDT RP Workstation: [id number]   CT Lumbar Spine Wo Contrast Result Date: 08/29/2024 EXAM: CT OF THE LUMBAR SPINE WITHOUT CONTRAST 08/29/2024 08:18:26 AM TECHNIQUE: CT of the lumbar spine was performed without the administration of intravenous contrast. Multiplanar reformatted images are provided for review. Automated exposure control, iterative reconstruction, and/or weight based adjustment of the mA/kV was utilized to reduce the radiation dose to as low as reasonably achievable. COMPARISON: CT of the lumbar spine dated 11/24/2013. CLINICAL HISTORY: Back trauma, no prior imaging (Age >= 16y); Lumbar radiculopathy, trauma. Pt arrives EMS from home with reports of fall last week. Pt has had lower back pain/ tail bone pain since the fall. Pt has been ambulatory with walker but this morning was unable to stand without assistance and has had increased pain. FINDINGS: BONES AND ALIGNMENT: Mild levoscoliotic curvature of the thoracolumbar spine again demonstrated. Since the previous study, the patient has developed a moderate chronic compression deformity of T12. The vertebral body has lost approximately 60 to 70% of its height anteriorly and there is buckling of the posterior superior corner of the vertebral body which is causing moderate central  spinal canal stenosis. The patient has also undergone decompression laminectomies,  interbody fusion and bilateral posterolateral spinal fixation at L4-L5. There is persistent grade 1 anterolisthesis at this level. Normal vertebral body heights for L1, L2, L3, L5, and S1. No acute fracture or suspicious bone lesion. DEGENERATIVE CHANGES: At L2-L3, there is mild diffuse disc bulging with mild central spinal canal stenosis and bilateral lateral recess stenosis. There are mild facet hypertrophic changes present at L3-L4. There is mild central spinal canal stenosis. At L5-S1, there is moderate bilateral facet arthrosis. The spinal canal and neural foramina appear widely patent. SOFT TISSUES: Aorta. There is a cyst with a partially calcified rim arising from the left kidney. There is also a simple-appearing cyst arising laterally from the lower pole of the right kidney. No further follow up of the cyst is necessary. No acute abnormality. IMPRESSION: 1. Moderate chronic compression deformity of T12 with approximately 60-70% anterior height loss and buckling of the posterior superior corner, causing moderate central spinal canal stenosis. 2. Status post decompression laminectomies, interbody fusion, and bilateral posterolateral spinal fixation at L4-5 with persistent grade 1 anterolisthesis. 3. Mild central spinal canal stenosis and bilateral lateral recess stenosis at L2-3. 4. Mild central spinal canal stenosis at L3-4. Electronically signed by: Evalene Coho MD 08/29/2024 08:32 AM EDT RP Workstation: [id number]   (Echo, Carotid, EGD, Colonoscopy, ERCP)    Subjective: No complaints  Discharge Exam: Vitals:   09/04/24 2133 09/05/24 0658  BP: (!) 139/58 (!) 145/60  Pulse: 72 67  Resp: 19 18  Temp: 98.2 F (36.8 C) 98.5 F (36.9 C)  SpO2: 100% 98%   Vitals:   09/04/24 1218 09/04/24 1511 09/04/24 2133 09/05/24 0658  BP:  (!) 148/61 (!) 139/58 (!) 145/60  Pulse: 68 72 72 67  Resp:  20 19 18   Temp:   98.2 F (36.8 C) 98.2 F (36.8 C) 98.5 F (36.9 C)  TempSrc:  Oral Oral Oral  SpO2:  97% 100% 98%  Weight:      Height:        General: Pt is alert, awake, not in acute distress Cardiovascular: RRR, S1/S2 +, no rubs, no gallops Respiratory: CTA bilaterally, no wheezing, no rhonchi Abdominal: Soft, NT, ND, bowel sounds + Extremities: no edema, no cyanosis    The results of significant diagnostics from this hospitalization (including imaging, microbiology, ancillary and laboratory) are listed below for reference.     Microbiology: No results found for this or any previous visit (from the past 240 hours).   Labs: BNP (last 3 results) No results for input(s): BNP in the last 8760 hours. Basic Metabolic Panel: Recent Labs  Lab 08/29/24 1604 08/30/24 0346 08/31/24 0351  NA  --  134* 134*  K  --  3.5 3.7  CL  --  100 100  CO2  --  23 23  GLUCOSE  --  104* 99  BUN  --  20 20  CREATININE  --  0.73 0.72  CALCIUM  --  8.7* 8.6*  MG 2.3  --  2.5*   Liver Function Tests: Recent Labs  Lab 08/30/24 0346  AST 20  ALT 10  ALKPHOS 85  BILITOT 0.5  PROT 6.5  ALBUMIN 3.7   No results for input(s): LIPASE, AMYLASE in the last 168 hours. No results for input(s): AMMONIA in the last 168 hours. CBC: Recent Labs  Lab 08/30/24 0346 08/31/24 0351  WBC 6.7 6.6  NEUTROABS  --  3.9  HGB 10.8* 10.7*  HCT 32.8* 33.4*  MCV 96.5 96.5  PLT 197 211  Cardiac Enzymes: No results for input(s): CKTOTAL, CKMB, CKMBINDEX, TROPONINI in the last 168 hours. BNP: Invalid input(s): POCBNP CBG: No results for input(s): GLUCAP in the last 168 hours. D-Dimer No results for input(s): DDIMER in the last 72 hours. Hgb A1c No results for input(s): HGBA1C in the last 72 hours. Lipid Profile No results for input(s): CHOL, HDL, LDLCALC, TRIG, CHOLHDL, LDLDIRECT in the last 72 hours. Thyroid  function studies No results for input(s): TSH, T4TOTAL,  T3FREE, THYROIDAB in the last 72 hours.  Invalid input(s): FREET3 Anemia work up No results for input(s): VITAMINB12, FOLATE, FERRITIN, TIBC, IRON, RETICCTPCT in the last 72 hours. Urinalysis    Component Value Date/Time   COLORURINE YELLOW 08/29/2024 0720   APPEARANCEUR HAZY (A) 08/29/2024 0720   LABSPEC 1.024 08/29/2024 0720   PHURINE 5.0 08/29/2024 0720   GLUCOSEU NEGATIVE 08/29/2024 0720   HGBUR NEGATIVE 08/29/2024 0720   BILIRUBINUR NEGATIVE 08/29/2024 0720   KETONESUR NEGATIVE 08/29/2024 0720   PROTEINUR NEGATIVE 08/29/2024 0720   NITRITE NEGATIVE 08/29/2024 0720   LEUKOCYTESUR TRACE (A) 08/29/2024 0720   Sepsis Labs Recent Labs  Lab 08/30/24 0346 08/31/24 0351  WBC 6.7 6.6   Microbiology No results found for this or any previous visit (from the past 240 hours).   Time coordinating discharge: Over 35 minutes  SIGNED:   Erle Odell Castor, MD  [facility] 09/05/2024, 8:45 AM Pager   If 7PM-7AM, please contact night-coverage [email or web address] Password [email or web address]

## 2024-09-07 DIAGNOSIS — S22080D Wedge compression fracture of T11-T12 vertebra, subsequent encounter for fracture with routine healing: Secondary | ICD-10-CM | POA: Diagnosis not present

## 2024-09-07 DIAGNOSIS — R2689 Other abnormalities of gait and mobility: Secondary | ICD-10-CM | POA: Diagnosis not present

## 2024-09-07 DIAGNOSIS — S3210XD Unspecified fracture of sacrum, subsequent encounter for fracture with routine healing: Secondary | ICD-10-CM | POA: Diagnosis not present

## 2024-09-07 DIAGNOSIS — M6281 Muscle weakness (generalized): Secondary | ICD-10-CM | POA: Diagnosis not present

## 2024-09-07 DIAGNOSIS — M4987 Spondylopathy in diseases classified elsewhere, lumbosacral region: Secondary | ICD-10-CM | POA: Diagnosis not present

## 2024-09-08 DIAGNOSIS — G500 Trigeminal neuralgia: Secondary | ICD-10-CM | POA: Diagnosis not present

## 2024-09-08 DIAGNOSIS — Z9181 History of falling: Secondary | ICD-10-CM | POA: Diagnosis not present

## 2024-09-08 DIAGNOSIS — S22080D Wedge compression fracture of T11-T12 vertebra, subsequent encounter for fracture with routine healing: Secondary | ICD-10-CM | POA: Diagnosis not present

## 2024-09-08 DIAGNOSIS — S3210XD Unspecified fracture of sacrum, subsequent encounter for fracture with routine healing: Secondary | ICD-10-CM | POA: Diagnosis not present

## 2024-09-08 DIAGNOSIS — I10 Essential (primary) hypertension: Secondary | ICD-10-CM | POA: Diagnosis not present

## 2024-09-08 DIAGNOSIS — M48061 Spinal stenosis, lumbar region without neurogenic claudication: Secondary | ICD-10-CM | POA: Diagnosis not present

## 2024-09-08 DIAGNOSIS — F32A Depression, unspecified: Secondary | ICD-10-CM | POA: Diagnosis not present

## 2024-09-08 DIAGNOSIS — E871 Hypo-osmolality and hyponatremia: Secondary | ICD-10-CM | POA: Diagnosis not present

## 2024-09-08 DIAGNOSIS — E785 Hyperlipidemia, unspecified: Secondary | ICD-10-CM | POA: Diagnosis not present

## 2024-09-12 DIAGNOSIS — R2689 Other abnormalities of gait and mobility: Secondary | ICD-10-CM | POA: Diagnosis not present

## 2024-09-12 DIAGNOSIS — M4987 Spondylopathy in diseases classified elsewhere, lumbosacral region: Secondary | ICD-10-CM | POA: Diagnosis not present

## 2024-09-12 DIAGNOSIS — M6281 Muscle weakness (generalized): Secondary | ICD-10-CM | POA: Diagnosis not present

## 2024-09-12 DIAGNOSIS — S3210XD Unspecified fracture of sacrum, subsequent encounter for fracture with routine healing: Secondary | ICD-10-CM | POA: Diagnosis not present

## 2024-09-12 DIAGNOSIS — S22080D Wedge compression fracture of T11-T12 vertebra, subsequent encounter for fracture with routine healing: Secondary | ICD-10-CM | POA: Diagnosis not present

## 2024-09-14 DIAGNOSIS — M6281 Muscle weakness (generalized): Secondary | ICD-10-CM | POA: Diagnosis not present

## 2024-09-14 DIAGNOSIS — S22080D Wedge compression fracture of T11-T12 vertebra, subsequent encounter for fracture with routine healing: Secondary | ICD-10-CM | POA: Diagnosis not present

## 2024-09-14 DIAGNOSIS — R2689 Other abnormalities of gait and mobility: Secondary | ICD-10-CM | POA: Diagnosis not present

## 2024-09-14 DIAGNOSIS — M5417 Radiculopathy, lumbosacral region: Secondary | ICD-10-CM | POA: Diagnosis not present

## 2024-09-14 DIAGNOSIS — S3210XD Unspecified fracture of sacrum, subsequent encounter for fracture with routine healing: Secondary | ICD-10-CM | POA: Diagnosis not present

## 2024-09-14 DIAGNOSIS — M4987 Spondylopathy in diseases classified elsewhere, lumbosacral region: Secondary | ICD-10-CM | POA: Diagnosis not present

## 2024-09-19 DIAGNOSIS — R2689 Other abnormalities of gait and mobility: Secondary | ICD-10-CM | POA: Diagnosis not present

## 2024-09-19 DIAGNOSIS — S22080D Wedge compression fracture of T11-T12 vertebra, subsequent encounter for fracture with routine healing: Secondary | ICD-10-CM | POA: Diagnosis not present

## 2024-09-19 DIAGNOSIS — M6281 Muscle weakness (generalized): Secondary | ICD-10-CM | POA: Diagnosis not present

## 2024-09-19 DIAGNOSIS — M4987 Spondylopathy in diseases classified elsewhere, lumbosacral region: Secondary | ICD-10-CM | POA: Diagnosis not present

## 2024-09-19 DIAGNOSIS — M5417 Radiculopathy, lumbosacral region: Secondary | ICD-10-CM | POA: Diagnosis not present

## 2024-09-19 DIAGNOSIS — S3210XD Unspecified fracture of sacrum, subsequent encounter for fracture with routine healing: Secondary | ICD-10-CM | POA: Diagnosis not present

## 2024-09-21 DIAGNOSIS — R2689 Other abnormalities of gait and mobility: Secondary | ICD-10-CM | POA: Diagnosis not present

## 2024-09-21 DIAGNOSIS — M4987 Spondylopathy in diseases classified elsewhere, lumbosacral region: Secondary | ICD-10-CM | POA: Diagnosis not present

## 2024-09-21 DIAGNOSIS — M6281 Muscle weakness (generalized): Secondary | ICD-10-CM | POA: Diagnosis not present

## 2024-09-21 DIAGNOSIS — G500 Trigeminal neuralgia: Secondary | ICD-10-CM | POA: Diagnosis not present

## 2024-09-21 DIAGNOSIS — S3210XD Unspecified fracture of sacrum, subsequent encounter for fracture with routine healing: Secondary | ICD-10-CM | POA: Diagnosis not present

## 2024-09-21 DIAGNOSIS — S22080D Wedge compression fracture of T11-T12 vertebra, subsequent encounter for fracture with routine healing: Secondary | ICD-10-CM | POA: Diagnosis not present

## 2024-09-21 DIAGNOSIS — I10 Essential (primary) hypertension: Secondary | ICD-10-CM | POA: Diagnosis not present

## 2024-09-21 DIAGNOSIS — M48061 Spinal stenosis, lumbar region without neurogenic claudication: Secondary | ICD-10-CM | POA: Diagnosis not present

## 2024-09-21 DIAGNOSIS — Z7189 Other specified counseling: Secondary | ICD-10-CM | POA: Diagnosis not present

## 2024-09-21 DIAGNOSIS — M5417 Radiculopathy, lumbosacral region: Secondary | ICD-10-CM | POA: Diagnosis not present

## 2024-09-21 DIAGNOSIS — Z9181 History of falling: Secondary | ICD-10-CM | POA: Diagnosis not present

## 2024-09-26 DIAGNOSIS — R2689 Other abnormalities of gait and mobility: Secondary | ICD-10-CM | POA: Diagnosis not present

## 2024-09-26 DIAGNOSIS — M5417 Radiculopathy, lumbosacral region: Secondary | ICD-10-CM | POA: Diagnosis not present

## 2024-09-26 DIAGNOSIS — S3210XD Unspecified fracture of sacrum, subsequent encounter for fracture with routine healing: Secondary | ICD-10-CM | POA: Diagnosis not present

## 2024-09-26 DIAGNOSIS — S22080D Wedge compression fracture of T11-T12 vertebra, subsequent encounter for fracture with routine healing: Secondary | ICD-10-CM | POA: Diagnosis not present

## 2024-09-26 DIAGNOSIS — M6281 Muscle weakness (generalized): Secondary | ICD-10-CM | POA: Diagnosis not present

## 2024-09-26 DIAGNOSIS — M4987 Spondylopathy in diseases classified elsewhere, lumbosacral region: Secondary | ICD-10-CM | POA: Diagnosis not present

## 2024-09-28 DIAGNOSIS — S22080D Wedge compression fracture of T11-T12 vertebra, subsequent encounter for fracture with routine healing: Secondary | ICD-10-CM | POA: Diagnosis not present

## 2024-09-28 DIAGNOSIS — R2689 Other abnormalities of gait and mobility: Secondary | ICD-10-CM | POA: Diagnosis not present

## 2024-09-28 DIAGNOSIS — G500 Trigeminal neuralgia: Secondary | ICD-10-CM | POA: Diagnosis not present

## 2024-09-28 DIAGNOSIS — M5417 Radiculopathy, lumbosacral region: Secondary | ICD-10-CM | POA: Diagnosis not present

## 2024-09-28 DIAGNOSIS — S3210XD Unspecified fracture of sacrum, subsequent encounter for fracture with routine healing: Secondary | ICD-10-CM | POA: Diagnosis not present

## 2024-09-28 DIAGNOSIS — E871 Hypo-osmolality and hyponatremia: Secondary | ICD-10-CM | POA: Diagnosis not present

## 2024-09-28 DIAGNOSIS — M4987 Spondylopathy in diseases classified elsewhere, lumbosacral region: Secondary | ICD-10-CM | POA: Diagnosis not present

## 2024-09-28 DIAGNOSIS — M6281 Muscle weakness (generalized): Secondary | ICD-10-CM | POA: Diagnosis not present

## 2024-09-28 DIAGNOSIS — E785 Hyperlipidemia, unspecified: Secondary | ICD-10-CM | POA: Diagnosis not present

## 2024-09-28 DIAGNOSIS — D649 Anemia, unspecified: Secondary | ICD-10-CM | POA: Diagnosis not present

## 2024-09-28 DIAGNOSIS — I10 Essential (primary) hypertension: Secondary | ICD-10-CM | POA: Diagnosis not present

## 2024-09-28 DIAGNOSIS — M48061 Spinal stenosis, lumbar region without neurogenic claudication: Secondary | ICD-10-CM | POA: Diagnosis not present
